# Patient Record
Sex: Male | Born: 1935 | Race: White | Hispanic: Yes | Marital: Married | Smoking: Former smoker
Health system: Southern US, Community
[De-identification: ages and names within clinical notes are randomized; demographics above are authoritative.]

## PROBLEM LIST (undated history)

## (undated) DIAGNOSIS — G309 Alzheimer's disease, unspecified: Secondary | ICD-10-CM

## (undated) DIAGNOSIS — M1990 Unspecified osteoarthritis, unspecified site: Secondary | ICD-10-CM

## (undated) DIAGNOSIS — G2581 Restless legs syndrome: Secondary | ICD-10-CM

## (undated) DIAGNOSIS — M5416 Radiculopathy, lumbar region: Secondary | ICD-10-CM

## (undated) DIAGNOSIS — Z8719 Personal history of other diseases of the digestive system: Secondary | ICD-10-CM

## (undated) DIAGNOSIS — H9190 Unspecified hearing loss, unspecified ear: Secondary | ICD-10-CM

## (undated) DIAGNOSIS — R55 Syncope and collapse: Secondary | ICD-10-CM

## (undated) DIAGNOSIS — N401 Enlarged prostate with lower urinary tract symptoms: Secondary | ICD-10-CM

## (undated) DIAGNOSIS — E785 Hyperlipidemia, unspecified: Secondary | ICD-10-CM

## (undated) DIAGNOSIS — R634 Abnormal weight loss: Secondary | ICD-10-CM

## (undated) DIAGNOSIS — F0280 Dementia in other diseases classified elsewhere without behavioral disturbance: Secondary | ICD-10-CM

## (undated) DIAGNOSIS — I10 Essential (primary) hypertension: Secondary | ICD-10-CM

## (undated) DIAGNOSIS — D649 Anemia, unspecified: Secondary | ICD-10-CM

## (undated) DIAGNOSIS — R7989 Other specified abnormal findings of blood chemistry: Secondary | ICD-10-CM

## (undated) DIAGNOSIS — H11009 Unspecified pterygium of unspecified eye: Secondary | ICD-10-CM

## (undated) DIAGNOSIS — E871 Hypo-osmolality and hyponatremia: Secondary | ICD-10-CM

## (undated) DIAGNOSIS — K219 Gastro-esophageal reflux disease without esophagitis: Secondary | ICD-10-CM

## (undated) DIAGNOSIS — N138 Other obstructive and reflux uropathy: Secondary | ICD-10-CM

## (undated) DIAGNOSIS — H547 Unspecified visual loss: Secondary | ICD-10-CM

## (undated) HISTORY — DX: Hyperlipidemia, unspecified: E78.5

## (undated) HISTORY — DX: Unspecified hearing loss, unspecified ear: H91.90

## (undated) HISTORY — DX: Benign prostatic hyperplasia with lower urinary tract symptoms: N13.8

## (undated) HISTORY — DX: Radiculopathy, lumbar region: M54.16

## (undated) HISTORY — DX: Dementia in other diseases classified elsewhere without behavioral disturbance: F02.80

## (undated) HISTORY — DX: Benign prostatic hyperplasia with lower urinary tract symptoms: N40.1

## (undated) HISTORY — DX: Other specified abnormal findings of blood chemistry: R79.89

## (undated) HISTORY — DX: Anemia, unspecified: D64.9

## (undated) HISTORY — DX: Hypo-osmolality and hyponatremia: E87.1

## (undated) HISTORY — DX: Syncope and collapse: R55

## (undated) HISTORY — PX: HERNIA REPAIR: SHX51

## (undated) HISTORY — DX: Restless legs syndrome: G25.81

## (undated) HISTORY — DX: Abnormal weight loss: R63.4

## (undated) HISTORY — DX: Unspecified pterygium of unspecified eye: H11.009

## (undated) HISTORY — DX: Unspecified visual loss: H54.7

## (undated) HISTORY — DX: Alzheimer's disease, unspecified: G30.9

---
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
## 1978-06-08 HISTORY — PX: INGUINAL HERNIA REPAIR: SHX194

## 2003-10-01 ENCOUNTER — Encounter: Admission: RE | Admit: 2003-10-01 | Discharge: 2003-10-01 | Payer: Self-pay | Admitting: Family Medicine

## 2003-10-04 ENCOUNTER — Encounter: Payer: Self-pay | Admitting: Cardiology

## 2003-10-04 ENCOUNTER — Inpatient Hospital Stay (HOSPITAL_COMMUNITY): Admission: EM | Admit: 2003-10-04 | Discharge: 2003-10-06 | Payer: Self-pay | Admitting: Emergency Medicine

## 2003-10-15 ENCOUNTER — Encounter: Admission: RE | Admit: 2003-10-15 | Discharge: 2003-10-15 | Payer: Self-pay | Admitting: Sports Medicine

## 2003-11-19 ENCOUNTER — Encounter: Admission: RE | Admit: 2003-11-19 | Discharge: 2003-11-19 | Payer: Self-pay | Admitting: Sports Medicine

## 2005-05-11 ENCOUNTER — Ambulatory Visit: Payer: Self-pay | Admitting: Family Medicine

## 2005-05-25 ENCOUNTER — Ambulatory Visit: Payer: Self-pay | Admitting: Family Medicine

## 2005-07-27 ENCOUNTER — Ambulatory Visit: Payer: Self-pay | Admitting: Family Medicine

## 2005-08-10 ENCOUNTER — Ambulatory Visit: Payer: Self-pay | Admitting: Family Medicine

## 2005-09-07 ENCOUNTER — Ambulatory Visit: Payer: Self-pay | Admitting: Family Medicine

## 2005-10-05 ENCOUNTER — Ambulatory Visit: Payer: Self-pay | Admitting: Family Medicine

## 2006-05-10 ENCOUNTER — Ambulatory Visit: Payer: Self-pay | Admitting: Sports Medicine

## 2006-07-12 ENCOUNTER — Ambulatory Visit (HOSPITAL_COMMUNITY): Admission: RE | Admit: 2006-07-12 | Discharge: 2006-07-12 | Payer: Self-pay | Admitting: Family Medicine

## 2006-07-12 ENCOUNTER — Ambulatory Visit: Payer: Self-pay | Admitting: Family Medicine

## 2006-07-12 LAB — CONVERTED CEMR LAB
ALT: 10 units/L (ref 0–53)
Albumin: 4.3 g/dL (ref 3.5–5.2)
CO2: 22 meq/L (ref 19–32)
Chloride: 98 meq/L (ref 96–112)
Sodium: 135 meq/L (ref 135–145)
Total Bilirubin: 0.6 mg/dL (ref 0.3–1.2)

## 2006-07-13 ENCOUNTER — Ambulatory Visit: Payer: Self-pay | Admitting: Family Medicine

## 2006-08-04 ENCOUNTER — Ambulatory Visit: Payer: Self-pay | Admitting: Family Medicine

## 2006-08-05 DIAGNOSIS — G4700 Insomnia, unspecified: Secondary | ICD-10-CM

## 2006-08-05 DIAGNOSIS — I70219 Atherosclerosis of native arteries of extremities with intermittent claudication, unspecified extremity: Secondary | ICD-10-CM | POA: Insufficient documentation

## 2006-08-05 DIAGNOSIS — R209 Unspecified disturbances of skin sensation: Secondary | ICD-10-CM

## 2006-08-05 DIAGNOSIS — G2589 Other specified extrapyramidal and movement disorders: Secondary | ICD-10-CM

## 2006-08-05 DIAGNOSIS — N400 Enlarged prostate without lower urinary tract symptoms: Secondary | ICD-10-CM | POA: Insufficient documentation

## 2006-08-05 DIAGNOSIS — E785 Hyperlipidemia, unspecified: Secondary | ICD-10-CM

## 2006-08-05 DIAGNOSIS — K219 Gastro-esophageal reflux disease without esophagitis: Secondary | ICD-10-CM

## 2006-10-08 ENCOUNTER — Ambulatory Visit: Payer: Self-pay | Admitting: Family Medicine

## 2006-10-08 LAB — CONVERTED CEMR LAB
Cholesterol: 154 mg/dL (ref 0–200)
HDL: 39 mg/dL — ABNORMAL LOW (ref >39)
LDL Cholesterol: 90 mg/dL (ref 0–99)
Triglycerides: 124 mg/dL (ref <150)
VLDL: 25 mg/dL (ref 0–40)

## 2006-10-20 ENCOUNTER — Ambulatory Visit: Payer: Self-pay | Admitting: Family Medicine

## 2006-10-20 DIAGNOSIS — I10 Essential (primary) hypertension: Secondary | ICD-10-CM | POA: Insufficient documentation

## 2006-10-20 DIAGNOSIS — H1045 Other chronic allergic conjunctivitis: Secondary | ICD-10-CM | POA: Insufficient documentation

## 2006-10-20 DIAGNOSIS — J301 Allergic rhinitis due to pollen: Secondary | ICD-10-CM

## 2006-11-02 ENCOUNTER — Ambulatory Visit: Payer: Self-pay | Admitting: Family Medicine

## 2006-11-02 LAB — CONVERTED CEMR LAB
BUN: 19 mg/dL (ref 6–23)
CO2: 24 meq/L (ref 19–32)
Creatinine, Ser: 1.54 mg/dL — ABNORMAL HIGH (ref 0.40–1.50)
Potassium: 4.8 meq/L (ref 3.5–5.3)
Sodium: 135 meq/L (ref 135–145)

## 2006-11-09 ENCOUNTER — Ambulatory Visit: Payer: Self-pay | Admitting: Family Medicine

## 2006-11-09 DIAGNOSIS — N400 Enlarged prostate without lower urinary tract symptoms: Secondary | ICD-10-CM

## 2006-11-09 DIAGNOSIS — H9190 Unspecified hearing loss, unspecified ear: Secondary | ICD-10-CM

## 2006-11-09 DIAGNOSIS — R0789 Other chest pain: Secondary | ICD-10-CM | POA: Insufficient documentation

## 2009-03-08 HISTORY — PX: PACEMAKER PLACEMENT: SHX43

## 2009-03-22 ENCOUNTER — Ambulatory Visit: Payer: Self-pay | Admitting: Internal Medicine

## 2009-03-23 ENCOUNTER — Ambulatory Visit: Payer: Self-pay | Admitting: Vascular Surgery

## 2009-03-23 ENCOUNTER — Inpatient Hospital Stay (HOSPITAL_COMMUNITY): Admission: EM | Admit: 2009-03-23 | Discharge: 2009-03-26 | Payer: Self-pay | Admitting: Emergency Medicine

## 2009-03-23 ENCOUNTER — Encounter (INDEPENDENT_AMBULATORY_CARE_PROVIDER_SITE_OTHER): Payer: Self-pay | Admitting: Internal Medicine

## 2009-03-25 ENCOUNTER — Encounter: Payer: Self-pay | Admitting: Internal Medicine

## 2009-03-26 ENCOUNTER — Encounter: Payer: Self-pay | Admitting: Internal Medicine

## 2009-04-08 ENCOUNTER — Ambulatory Visit: Payer: Self-pay

## 2009-04-10 ENCOUNTER — Encounter: Payer: Self-pay | Admitting: Physician Assistant

## 2009-04-10 ENCOUNTER — Ambulatory Visit: Payer: Self-pay | Admitting: Internal Medicine

## 2009-04-17 ENCOUNTER — Ambulatory Visit: Payer: Self-pay | Admitting: Physician Assistant

## 2009-04-17 DIAGNOSIS — K5900 Constipation, unspecified: Secondary | ICD-10-CM | POA: Insufficient documentation

## 2009-04-17 LAB — CONVERTED CEMR LAB
AST: 16 units/L (ref 0–37)
Albumin: 4.4 g/dL (ref 3.5–5.2)
Alkaline Phosphatase: 81 units/L (ref 39–117)
Calcium: 9.6 mg/dL (ref 8.4–10.5)
Creatinine, Ser: 1.32 mg/dL (ref 0.40–1.50)
Total Protein: 7.4 g/dL (ref 6.0–8.3)

## 2009-04-30 ENCOUNTER — Telehealth: Payer: Self-pay | Admitting: Physician Assistant

## 2009-04-30 LAB — CONVERTED CEMR LAB: BUN: 19 mg/dL (ref 6–23)

## 2009-06-28 ENCOUNTER — Ambulatory Visit: Payer: Self-pay | Admitting: Internal Medicine

## 2009-07-09 ENCOUNTER — Ambulatory Visit: Payer: Self-pay | Admitting: Family Medicine

## 2009-07-09 LAB — CONVERTED CEMR LAB
Chloride: 101 meq/L (ref 96–112)
Creatinine, Ser: 1.23 mg/dL (ref 0.40–1.50)
HCT: 39.7 % (ref 39.0–52.0)
Hemoglobin: 13.7 g/dL (ref 13.0–17.0)
MCV: 91.7 fL (ref 78.0–100.0)
Platelets: 225 10*3/uL (ref 150–400)
RBC: 4.33 M/uL (ref 4.22–5.81)
RDW: 13.1 % (ref 11.5–15.5)
Sodium: 135 meq/L (ref 135–145)

## 2009-07-11 ENCOUNTER — Encounter: Payer: Self-pay | Admitting: Family Medicine

## 2009-10-11 ENCOUNTER — Ambulatory Visit: Payer: Self-pay | Admitting: Family Medicine

## 2009-10-31 ENCOUNTER — Ambulatory Visit: Payer: Self-pay | Admitting: Family Medicine

## 2009-10-31 DIAGNOSIS — F329 Major depressive disorder, single episode, unspecified: Secondary | ICD-10-CM

## 2009-12-17 ENCOUNTER — Ambulatory Visit: Payer: Self-pay | Admitting: Family Medicine

## 2010-02-19 ENCOUNTER — Telehealth: Payer: Self-pay | Admitting: Family Medicine

## 2010-02-25 ENCOUNTER — Encounter: Payer: Self-pay | Admitting: Family Medicine

## 2010-02-25 ENCOUNTER — Ambulatory Visit: Payer: Self-pay | Admitting: Family Medicine

## 2010-02-25 LAB — CONVERTED CEMR LAB
LDL Cholesterol: 137 mg/dL — ABNORMAL HIGH (ref 0–99)
Triglycerides: 104 mg/dL (ref <150)

## 2010-03-06 ENCOUNTER — Ambulatory Visit: Payer: Self-pay | Admitting: Family Medicine

## 2010-03-13 ENCOUNTER — Ambulatory Visit: Payer: Self-pay | Admitting: Internal Medicine

## 2010-03-13 DIAGNOSIS — I441 Atrioventricular block, second degree: Secondary | ICD-10-CM | POA: Insufficient documentation

## 2010-07-08 NOTE — Assessment & Plan Note (Signed)
Summary: f/u,df    Vital Signs:  Patient profile:   77 year old male Height:      63 inches Weight:      137.2 pounds BMI:     24.39 Pulse rate:   72 / minute BP sitting:   122 / 70  (left arm) Cuff size:   regular  Vitals Entered By: Arlyss Repress CMA, (December 17, 2009 2:20 PM) CC: f/up last OV/HTN Is Patient Diabetic? No Pain Assessment Patient in pain? no        Primary Care Provider:  Zachery Dauer MD  CC:  f/up last OV/HTN.  History of Present Illness: Eyes tearing and nose running past couple weeks. history of allergies all year long. Not taking antihistamines.   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   Habits & Providers  Alcohol-Tobacco-Diet     Tobacco Status: quit > 6 months  Current Medications (verified): 1)  Aspirin Ec 325 Mg Tbec (Aspirin) .... Take One Tablet By Mouth Daily 2)  Fish Oil   Oil (Fish Oil) .... Take 1 Capsule By Mouth Three Times A Day 3)  Gabapentin 300 Mg Caps (Gabapentin) .... Take 1 Capsule By Mouth At Bedtime 4)  Lovastatin 10 Mg Tabs (Lovastatin) .... Take One Tablet By Mouth Daily At Bedtime 5)  Omeprazole 20 Mg Cpdr (Omeprazole) .... Take 2 Capsules By Mouth  A Day 6)  Paroxetine Hcl 20 Mg Tabs (Paroxetine Hcl) .Marland Kitchen.. 1 Tab By Mouth Daily 7)  Miralax  Powd (Polyethylene Glycol 3350) .... Use 17gm in 8 Oz of Water/juice Daily For Bowels 8)  Amlodipine Besylate 5 Mg Tabs (Amlodipine Besylate) .Marland Kitchen.. 1 Tab By Mouth Daily For High Blood Pressure.  Label in Spanish 9)  Fluticasone Propionate 50 Mcg/act Susp (Fluticasone Propionate) .... 2 Sprays Each Nostril Daily For Allergies.  Label in Spanish. 10)  Loratadine 10 Mg Tabs (Loratadine) .... Take One Tablet Daily  Allergies (verified): No Known Drug Allergies  Physical Exam  General:  alert, well-developed, and well-nourished.  thin.  NAD. vitals reviewed Ears:  Hearing aide in right ear. Canals and TM's normal  Nose:  nasal dischargemucosal pallor.   Mouth:  Upper  denture. Pharynx normal Lungs:  normal breath sounds, no crackles, and no wheezes.   Heart:  normal rate and regular rhythm.   Abdomen:  soft and non-tender.   Extremities:  no edema Psych:  Pleasant but subdued.     Impression & Recommendations:  Problem # 1:  ALLERGIC RHINITIS, SEASONAL (ICD-477.0) Start Loratidine [facility]- Est  Level 4 ([address])  Problem # 2:  HYPERTENSION, BENIGN (ICD-401.1) Well controlled on current meds/  His updated medication list for this problem includes:    Amlodipine Besylate 5 Mg Tabs (Amlodipine besylate) .Marland Kitchen... 1 tab by mouth daily for high blood pressure.  label in spanish  Orders: [facility]- Est  Level 4 ([address])  Problem # 3:  GASTROESOPHAGEAL REFLUX, NO ESOPHAGITIS (ICD-530.81) no complaints of recent symptoms  His updated medication list for this problem includes:    Omeprazole 20 Mg Cpdr (Omeprazole) .Marland Kitchen... Take 2 capsules by mouth  a day  Complete Medication List: 1)  Aspirin Ec 325 Mg Tbec (Aspirin) .... Take one tablet by mouth daily 2)  Fish Oil Oil (Fish oil) .... Take 1 capsule by mouth three times a day 3)  Gabapentin 300 Mg Caps (Gabapentin) .... Take 1 capsule by mouth at bedtime 4)  Lovastatin 10 Mg Tabs (Lovastatin) .... Take one tablet by  mouth daily at bedtime 5)  Omeprazole 20 Mg Cpdr (Omeprazole) .... Take 2 capsules by mouth  a day 6)  Paroxetine Hcl 20 Mg Tabs (Paroxetine hcl) .Marland Kitchen.. 1 tab by mouth daily 7)  Miralax Powd (Polyethylene glycol 3350) .... Use 17gm in 8 oz of water/juice daily for bowels 8)  Amlodipine Besylate 5 Mg Tabs (Amlodipine besylate) .Marland Kitchen.. 1 tab by mouth daily for high blood pressure.  label in spanish 9)  Fluticasone Propionate 50 Mcg/act Susp (Fluticasone propionate) .... 2 sprays each nostril daily for allergies.  label in spanish. 10)  Loratadine 10 Mg Tabs (Loratadine) .... Take one tablet daily  Other Orders: Future Orders: Lipid-[facility] ([id number]) ... 12/07/2010  Patient Instructions: 1)  Please return for a  FASTING Lipid Profile one(1) week before your next appointment as scheduled.  2)  Please schedule a follow-up appointment in 3 months .  Prescriptions: MIRALAX  POWD (POLYETHYLENE GLYCOL 3350) Use 17gm in 8 oz of water/juice daily for bowels  #1 container x 3   Entered and Authorized by:   Zachery Dauer MD   Signed by:   Zachery Dauer MD on 12/17/2009   Method used:   Electronically to        [facility]* (mail-order)       [address]       [place], [place]  [address]       Ph: [phone]       Fax: [phone]   RxID:   [id number] FLUTICASONE PROPIONATE 50 MCG/ACT SUSP (FLUTICASONE PROPIONATE) 2 sprays each nostril daily for allergies.  label in spanish.  #1 x 3   Entered and Authorized by:   Zachery Dauer MD   Signed by:   Zachery Dauer MD on 12/17/2009   Method used:   Electronically to        [facility]* (mail-order)       [address]       [place], [place]  [address]       Ph: [phone]       Fax: [phone]   RxID:   [id number] AMLODIPINE BESYLATE 5 MG TABS (AMLODIPINE BESYLATE) 1 tab by mouth daily for high blood pressure.  label in spanish  #90 x 3   Entered and Authorized by:   Zachery Dauer MD   Signed by:   Zachery Dauer MD on 12/17/2009   Method used:   Electronically to        [facility]* (mail-order)       [address]       [place], [place]  [address]       Ph: [phone]       Fax: [phone]   RxID:   [id number] PAROXETINE HCL 20 MG TABS (PAROXETINE HCL) 1 tab by mouth daily  #90 x 3   Entered and Authorized by:   Zachery Dauer MD   Signed by:   Zachery Dauer MD on 12/17/2009   Method used:   Electronically to        [facility]* (mail-order)       [address]       [place], [place]  [address]       Ph: [phone]       Fax: [phone]   RxID:   [id number] LOVASTATIN 10 MG TABS (LOVASTATIN) Take one tablet by mouth daily at bedtime  #90 x 3   Entered and Authorized  by:   Zachery Dauer  MD   Signed by:   Zachery Dauer MD on 12/17/2009   Method used:   Electronically to        [facility]* (mail-order)       [address]       [place], [place]  [address]       Ph: [phone]       Fax: [phone]   RxID:   [id number] GABAPENTIN 300 MG CAPS (GABAPENTIN) Take 1 capsule by mouth at bedtime  #90 x 3   Entered and Authorized by:   Zachery Dauer MD   Signed by:   Zachery Dauer MD on 12/17/2009   Method used:   Electronically to        [facility]* (mail-order)       [address]       [place], [place]  [address]       Ph: [phone]       Fax: [phone]   RxID:   [id number] ASPIRIN EC 325 MG TBEC (ASPIRIN) Take one tablet by mouth daily  #90 x 3   Entered and Authorized by:   Zachery Dauer MD   Signed by:   Zachery Dauer MD on 12/17/2009   Method used:   Electronically to        [facility]* (mail-order)       [address]       [place], [place]  [address]       Ph: [phone]       Fax: [phone]   RxID:   [id number] LORATADINE 10 MG TABS (LORATADINE) Take one tablet daily  #90 x 3   Entered and Authorized by:   Zachery Dauer MD   Signed by:   Zachery Dauer MD on 12/17/2009   Method used:   Electronically to        [facility]* (mail-order)       [address]       [place], [place]  [address]       Ph: [phone]       Fax: [phone]   RxID:   [id number]     Prevention & Chronic Care Immunizations   Influenza vaccine: Not documented    Tetanus booster: 09/12/2005: Done.    Pneumococcal vaccine: Done.  (06/09/1999)    H. zoster vaccine: Not documented  Colorectal Screening   Hemoccult: Not documented    Colonoscopy: Not documented   Colonoscopy action/deferral: Not indicated  (12/17/2009)  Other Screening   PSA: Not documented   PSA action/deferral: Not indicated  (07/09/2009)   Smoking status: quit > 6 months  (12/17/2009)  Lipids    Total Cholesterol: 154  (10/08/2006)   LDL: 90  (10/08/2006)   LDL Direct: Not documented   HDL: 39  (10/08/2006)   Triglycerides: 124  (10/08/2006)    SGOT (AST): 16  (04/10/2009)   SGPT (ALT): 12  (04/10/2009)   Alkaline phosphatase: 81  (04/10/2009)   Total bilirubin: 0.6  (04/10/2009)    Lipid flowsheet reviewed?: Yes   Progress toward LDL goal: Unchanged  Hypertension   Last Blood Pressure: 122 / 70  (12/17/2009)   Serum creatinine: 1.23  (07/09/2009)   Serum potassium 4.3  (07/09/2009)    Hypertension flowsheet reviewed?: Yes   Progress toward BP goal: At goal  Self-Management Support :   Personal Goals (by the next clinic visit) :      Personal blood pressure goal: 140/90  (  07/09/2009)     Personal LDL goal: 130  (07/09/2009)    Hypertension self-management support: Not documented    Lipid self-management support: Not documented      Appended Document: f/u,df     Prescriptions: OMEPRAZOLE 20 MG CPDR (OMEPRAZOLE) Take 2 capsules by mouth  a day  #30 x 11   Entered and Authorized by:   Zachery Dauer MD   Signed by:   Zachery Dauer MD on 01/27/2010   Method used:   Electronically to        [facility] [phone]* (retail)       [address]       [place], [place]  [address]       Ph: [phone]       Fax: [phone]   RxID:   [id number] GABAPENTIN 300 MG CAPS (GABAPENTIN) Take 1 capsule by mouth at bedtime  #90 x 11   Entered and Authorized by:   Zachery Dauer MD   Signed by:   Zachery Dauer MD on 01/27/2010   Method used:   Electronically to        [facility] [phone]* (retail)       [address]       [place], [place]  [address]       Ph: [phone]       Fax: [phone]   RxID:   [id number]

## 2010-07-08 NOTE — Progress Notes (Signed)
Summary: Rx Req omeprazole   Phone Note Refill Request Call back at Home Phone [phone] Message from:  [facility] -DAUGHTER  Refills Requested: Medication #1:  OMEPRAZOLE 20 MG CPDR Take 2 capsules by mouth  a day [facility] FAX NUMBER [phone]  Initial call taken by: Clydell Hakim,  February 19, 2010 4:21 PM    Prescriptions: OMEPRAZOLE 20 MG CPDR (OMEPRAZOLE) Take 2 capsules by mouth  a day  #180 x 11   Entered and Authorized by:   Zachery Dauer MD   Signed by:   Zachery Dauer MD on 02/19/2010   Method used:   Electronically to        [facility]* (mail-order)       [address]       [place], [place]  [address]       Ph: [phone]       Fax: [phone]   RxID:   [id number]

## 2010-07-08 NOTE — Assessment & Plan Note (Signed)
Summary: f/up Htn,tcb   Vital Signs:  Patient profile:   77 year old male Height:      63 inches Weight:      134 pounds Temp:     98.0 degrees F oral Pulse rate:   114 / minute BP sitting:   130 / 81  (right arm) Cuff size:   regular  Vitals Entered By: Tessie Fass, CMA (July 09, 2009 2:31 PM)  Serial Vital Signs/Assessments:  Time      Position  BP       Pulse  Resp  Temp     By                              88                    Zachery Dauer MD  CC: F/U Is Patient Diabetic? No Pain Assessment Patient in pain? no        Primary Care Provider:  Tereso Newcomer, PA-C  CC:  F/U.  History of Present Illness: Hasn't had anymore palpitations or lightheadedness since getting his pacemaker.  complains of insomnia. Naps in the daytime. Goes to bed at 9PM  Had pneumovax and flu shot last fall in [place].   Habits & Providers  Alcohol-Tobacco-Diet     Tobacco Status: quit  Current Medications (verified): 1)  Aspirin Ec 325 Mg Tbec (Aspirin) .... Take One Tablet By Mouth Daily 2)  Fish Oil   Oil (Fish Oil) .... Take 1 Capsule By Mouth Three Times A Day 3)  Gabapentin 300 Mg Caps (Gabapentin) .... Take 1 Capsule By Mouth At Bedtime 4)  Lovastatin 10 Mg Tabs (Lovastatin) .... Take One Tablet By Mouth Daily At Bedtime 5)  Omeprazole 20 Mg Cpdr (Omeprazole) .... Take 2 Capsules By Mouth  A Day 6)  Paroxetine Hcl 10 Mg Tabs (Paroxetine Hcl) .... Take 1 Tablet By Mouth Once A Day 7)  Miralax  Powd (Polyethylene Glycol 3350) .... Use 17gm in 8 Oz of Water/juice Daily For Bowels 8)  Enalapril Maleate 10 Mg Tabs (Enalapril Maleate) .... Take One Tablet By Mouth Once Daily  Allergies (verified): No Known Drug Allergies  Social History: From [place], lived in [place] until 2/05.   10 children (multiple grandchildren), 3 children in [place].  Retired Visual merchandiser. Education, second grade, can't read Spanish  Lives w/wife and son.  No tob/etoh.  Catholic Daughter  Jacky Kindle  Physical Exam  General:  alert, well-developed, and well-nourished.   Chest Wall:  L chest pacer site healing well without drainage or redness Lungs:  normal breath sounds, no crackles, and no wheezes.   Heart:  normal rate and regular rhythm.   Extremities:  no edema   Impression & Recommendations:  Problem # 1:  HYPERTENSION, BENIGN (ICD-401.1)  His updated medication list for this problem includes:    Enalapril Maleate 10 Mg Tabs (Enalapril maleate) .Marland Kitchen... Take one tablet by mouth once daily  Problem # 2:  HYPONATREMIA (ICD-276.1)  Orders: [facility]- Est  Level 4 ([address]) Basic Met-[facility] ([address])  Problem # 3:  LOSS, HEARING NOS (ICD-389.9) Wears aide right side  Problem # 4:  INSOMNIA NOS (ICD-780.52) Encouraged not to nap in the daytime and to spend less time in bed  Problem # 5:  GASTROESOPHAGEAL REFLUX, NO ESOPHAGITIS (ICD-530.81)  His updated medication list for this problem includes:    Omeprazole 20 Mg Cpdr (  Omeprazole) .Marland Kitchen... Take 2 capsules by mouth  a day  Orders: [facility] ([address])  Complete Medication List: 1)  Aspirin Ec 325 Mg Tbec (Aspirin) .... Take one tablet by mouth daily 2)  Fish Oil Oil (Fish oil) .... Take 1 capsule by mouth three times a day 3)  Gabapentin 300 Mg Caps (Gabapentin) .... Take 1 capsule by mouth at bedtime 4)  Lovastatin 10 Mg Tabs (Lovastatin) .... Take one tablet by mouth daily at bedtime 5)  Omeprazole 20 Mg Cpdr (Omeprazole) .... Take 2 capsules by mouth  a day 6)  Paroxetine Hcl 10 Mg Tabs (Paroxetine hcl) .... Take 1 tablet by mouth once a day 7)  Miralax Powd (Polyethylene glycol 3350) .... Use 17gm in 8 oz of water/juice daily for bowels 8)  Enalapril Maleate 10 Mg Tabs (Enalapril maleate) .... Take one tablet by mouth once daily  Patient Instructions: 1)  Please schedule a follow-up appointment in 3 months .  2)  voy a llamarle si hay problema con las pruebas de [place].  3)  Mande las pruebas de  excremento cuando los han completado   Prevention & Chronic Care Immunizations   Influenza vaccine: Not documented    Tetanus booster: 09/12/2005: Done.    Pneumococcal vaccine: Done.  (06/09/1999)    H. zoster vaccine: Not documented  Colorectal Screening   Hemoccult: Not documented    Colonoscopy: Not documented  Other Screening   PSA: Not documented   PSA action/deferral: Not indicated  (07/09/2009)   Smoking status: quit  (07/09/2009)  Lipids   Total Cholesterol: 154  (10/08/2006)   LDL: 90  (10/08/2006)   LDL Direct: Not documented   HDL: 39  (10/08/2006)   Triglycerides: 124  (10/08/2006)    SGOT (AST): 16  (04/10/2009)   SGPT (ALT): 12  (04/10/2009)   Alkaline phosphatase: 81  (04/10/2009)   Total bilirubin: 0.6  (04/10/2009)    Lipid flowsheet reviewed?: Yes  Hypertension   Last Blood Pressure: 130 / 81  (07/09/2009)   Serum creatinine: 1.21  (04/17/2009)   Serum potassium 5.3  (04/17/2009)    Hypertension flowsheet reviewed?: Yes   Progress toward BP goal: At goal  Self-Management Support :   Personal Goals (by the next clinic visit) :      Personal blood pressure goal: 140/90  (07/09/2009)     Personal LDL goal: 130  (07/09/2009)    Hypertension self-management support: Not documented    Lipid self-management support: Not documented    Appended Document: Hemoccult card neg x 3  Laboratory Results  Date/Time Received: July 15, 2009   Date/Time Reported: July 15, 2009 3:57 PM   Stool - Occult Blood Hemmoccult #1: negative Hemoccult #2: negative Hemoccult #3: negative Comments: No collection dates written on cards.  ...........test performed by...........Marland KitchenTerese Door, CMA

## 2010-07-08 NOTE — Assessment & Plan Note (Signed)
Summary: per check out/sf      Allergies Added: NKDA  Visit Type:  Follow-up Primary Provider:  Zachery Dauer MD   History of Present Illness: The patient presents today for routine electrophysiology followup. He reports doing very well since having his pacemaker implanted. The patient denies symptoms of palpitations, chest pain, shortness of breath, orthopnea, PND, lower extremity edema, dizziness, presyncope, syncope, or neurologic sequela. The patient is tolerating medications without difficulties and is otherwise without complaint today.   Current Medications (verified): 1)  Aspirin Ec 325 Mg Tbec (Aspirin) .... Take One Tablet By Mouth Daily 2)  Fish Oil   Oil (Fish Oil) .... Take 1 Capsule By Mouth Three Times A Day 3)  Gabapentin 600 Mg Tabs (Gabapentin) .... Take 1/2 Tab in The Am and 1 At Bedtime 4)  Lovastatin 20 Mg Tabs (Lovastatin) .... Take One Tab Every At Bedtime 5)  Omeprazole 20 Mg Cpdr (Omeprazole) .... Take 2 Capsules By Mouth  A Day 6)  Paroxetine Hcl 20 Mg Tabs (Paroxetine Hcl) .Marland Kitchen.. 1 Tab By Mouth Daily 7)  Miralax  Powd (Polyethylene Glycol 3350) .... Use 17gm in 8 Oz of Water/juice Daily For Bowels 8)  Amlodipine Besylate 5 Mg Tabs (Amlodipine Besylate) .Marland Kitchen.. 1 Tab By Mouth Daily For High Blood Pressure.  Label in Spanish 9)  Fluticasone Propionate 50 Mcg/act Susp (Fluticasone Propionate) .... 2 Sprays Each Nostril Daily For Allergies.  Label in Spanish. 10)  Loratadine 10 Mg Tabs (Loratadine) .... Take One Tablet Daily  Allergies (verified): No Known Drug Allergies  Past History:  Past Medical History: Reviewed history from 06/28/2009 and no changes required. UGI bleed? 4/05 H pylori + 10/2003 not treated yet TSH 2.124 nl - 10/15/2003 Echocardiogram EF 55-65% mild MR - 10/18/2003    a.  03/23/2009:  EF 60-65%, mild LVH, grade 1 dias. dysfxn. BPH symptom score low - 07/31/2005 Hypertension Hyperlipidemia GERD h/o chronic back pain (unsure why he is on  gabapentin) Depression (on paroxetine) second degree AV block and syncope  Past Surgical History: Reviewed history from 04/10/2009 and no changes required. ?testicular procedure 1932 - 10/01/2003 Hernia Repair - 06/08/1978 Permanent pacemaker (03/25/2009) - secondary to syncope in setting of bradycardia and second degree block\par    a.  Dr. Hillis Range  Social History: Reviewed history from 07/09/2009 and no changes required. From [place], lived in [place] until 2/05.   10 children (multiple grandchildren), 3 children in [place].  Retired Visual merchandiser. Education, second grade, can't read Spanish  Lives w/wife and son.  No tob/etoh.  Catholic Daughter Jacky Kindle  Vital Signs:  Patient profile:   78 year old male Height:      63 inches Weight:      142 pounds BMI:     25.25 Pulse rate:   64 / minute BP sitting:   140 / 80  (left arm)  Vitals Entered By: Laurance Flatten CMA (March 13, 2010 3:35 PM)  Physical Exam  General:  alert, well-developed, and well-nourished.  thin.  NAD.   Head:  normocephalic and atraumatic Eyes:  conjunctiva clear, no injection Mouth:  Upper denture. Pharynx normal Neck:  Neck supple, no JVD. No masses, thyromegaly or abnormal cervical nodes. Chest Wall:  pacemaker site is well healed Lungs:  Clear bilaterally to auscultation and percussion. Heart:  Non-displaced PMI, chest non-tender; regular rate and rhythm, S1, S2 without murmurs, rubs or gallops. Carotid upstroke normal, no bruit. Normal abdominal aortic size, no bruits. Femorals normal pulses, no bruits.  Pedals normal pulses. No edema, no varicosities. Abdomen:  Bowel sounds positive; abdomen soft and non-tender without masses, organomegaly, or hernias noted. No hepatosplenomegaly. Msk:  Back normal, normal gait. Muscle strength and tone normal. Pulses:  pulses normal in all 4 extremities Extremities:  No clubbing or cyanosis. Neurologic:  Alert and oriented x 3.   PPM  Specifications Following MD:  Hillis Range, MD     PPM Vendor:  [employer]     PPM Model Number:  [id number]     PPM Serial Number:  [id number] PPM DOI:  03/25/2009     PPM Implanting MD:  Hillis Range, MD  Lead 1    Location: RA     DOI: 03/25/2009     Model #: [id number]     Serial #: [id number]     Status: active Lead 2    Location: RV     DOI: 03/25/2009     Model #: [id number]     Serial #: [id number]     Status: active  PPM Follow Up Battery Voltage:  2.79 V     Battery Est. Longevity:  6Y 9 MTHS     Pacer Dependent:  No       PPM Device Measurements Atrium  Amplitude: 2.5 mV, Impedance: 476 ohms, Threshold: 0.6 V at 0.40 msec Right Ventricle  Amplitude: 17.9 mV, Impedance: 982 ohms, Threshold: 0.5 V at 0.40 msec  Episodes MS Episodes:  385     Ventricular High Rate:  0     Atrial Pacing:  73%     Ventricular Pacing:  99%  Parameters Mode:  DDD-CLS     Lower Rate Limit:  60     Upper Rate Limit:  130 Paced AV Delay:  300     Next Cardiology Appt Due:  09/08/2010 Tech Comments:  CHECKED BY INDUSTRY--NORMAL DEVICE FUNCTION.  ROV IN 6 MTHS W/DEVICE CLINIC. Vella Kohler  March 13, 2010 4:08 PM MD Comments:  agree paced AV extended to from to promote intrinsic conduction.  He has intrinsic conduction today with PR of 260.  If worsening symptoms, we will return AV delay to .  Impression & Recommendations:  Problem # 1:  SECOND DEGREE ATRIOVENTRICULAR HEART BLOCK (ICD-426.13) The patient is s/p PPM.  I have extended av delay as above to promote instrensic conduction. no other changes today.  Problem # 2:  HYPERTENSION, BENIGN (ICD-401.1) stable

## 2010-07-08 NOTE — Letter (Signed)
Summary: Results Follow-up Letter  [facility]  [address]   [place], [place] [address]   Phone: [phone]  Fax: [phone]    07/11/2009  [address] [place], [place]  [address]  Dear Mr. GUTIERREZ,   Patient: [employer]  Las pruebas de sang[place] salieron normales.  Tests: (1) CBC NO Diff (Complete Blood Count) (10000)   WBC                       5.1 K/uL                    4.0-10.5   RBC                       4.33 MIL/uL                 4.22-5.81   Hemoglobin                13.7 g/dL                   57.8-46.9   Hematocrit                39.7 %                      39.0-52.0   MCV                       91.7 fL                     78.0-100.0   MCHC                      34.5 g/dL                   62.9-52.8   RDW                       13.1 %                      11.5-15.5   Platelet Count            225 K/uL                    150-400  Tests: (2) Basic Metabolic Panel ([address])   Sodium                    135 mEq/L                   135-145   Potassium                 4.3 mEq/L                   3.5-5.3   Chloride                  101 mEq/L                   96-112   CO2                       23 mEq/L                    19-32   Glucose  90 mg/dL                    81-19   BUN                       16 mg/dL                    1-47   Creatinine                1.23 mg/dL                  0.40-1.50   Calcium                   8.5 mg/dL                   8.2-95.6  Document Creation Date: 07/09/2009 11:58 PM _______________________________________________________________________  Sincerely,  Zachery Dauer MD [facility]            Appended Document: Results Follow-up Letter mailed.

## 2010-07-08 NOTE — Assessment & Plan Note (Signed)
Summary: pc2/kfw [employer]  Medications Added OMEPRAZOLE 20 MG CPDR (OMEPRAZOLE) Take 2 capsules by mouth  a day ENALAPRIL MALEATE 10 MG TABS (ENALAPRIL MALEATE) Take one tablet by mouth once daily      Allergies Added: NKDA  Visit Type:  Follow-up Primary Provider:  Tereso Newcomer, PA-C   History of Present Illness: The patient presents today for routine electrophysiology followup. He reports doing very well since having his pacemaker implanted. The patient denies symptoms of palpitations, chest pain, shortness of breath, orthopnea, PND, lower extremity edema, dizziness, presyncope, syncope, or neurologic sequela. The patient is tolerating medications without difficulties and is otherwise without complaint today.   Current Medications (verified): 1)  Aspirin Ec 325 Mg Tbec (Aspirin) .... Take One Tablet By Mouth Daily 2)  Fish Oil   Oil (Fish Oil) .... Take 1 Capsule By Mouth Three Times A Day 3)  Gabapentin 300 Mg Caps (Gabapentin) .... Take 1 Capsule By Mouth At Bedtime 4)  Lovastatin 10 Mg Tabs (Lovastatin) .... Take One Tablet By Mouth Daily At Bedtime 5)  Omeprazole 20 Mg Cpdr (Omeprazole) .... Take 2 Capsules By Mouth  A Day 6)  Paroxetine Hcl 10 Mg Tabs (Paroxetine Hcl) .... Take 1 Tablet By Mouth Once A Day 7)  Miralax  Powd (Polyethylene Glycol 3350) .... Use 17gm in 8 Oz of Water/juice Daily For Bowels 8)  Enalapril Maleate 10 Mg Tabs (Enalapril Maleate) .... Take One Tablet By Mouth Once Daily  Allergies (verified): No Known Drug Allergies  Past History:  Past Medical History: UGI bleed? 4/05 H pylori + 10/2003 not treated yet TSH 2.124 nl - 10/15/2003 Echocardiogram EF 55-65% mild MR - 10/18/2003    a.  03/23/2009:  EF 60-65%, mild LVH, grade 1 dias. dysfxn. BPH symptom score low - 07/31/2005 Hypertension Hyperlipidemia GERD h/o chronic back pain (unsure why he is on gabapentin) Depression (on paroxetine) second degree AV block and syncope  Past Surgical  History: Reviewed history from 04/10/2009 and no changes required. ?testicular procedure 1932 - 10/01/2003 Hernia Repair - 06/08/1978 Permanent pacemaker (03/25/2009) - secondary to syncope in setting of bradycardia and second degree block\par    a.  Dr. Hillis Range  Social History: Reviewed history from 10/04/2006 and no changes required. From [place], lived in [place] until 2/05.   10 children (multiple grandchildren), 3 children in [place].  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  No tob/etoh.  Catholic Daughter Jacky Kindle  Vital Signs:  Patient profile:   77 year old male Height:      63 inches Weight:      132 pounds Pulse rate:   68 / minute BP sitting:   152 / 90  (left arm)  Vitals Entered By: Laurance Flatten CMA (June 28, 2009 9:31 AM)  Physical Exam  General:  Well developed, well nourished, in no acute distress. Head:  normocephalic and atraumatic Mouth:  Teeth, gums and palate normal. Oral mucosa normal. Neck:  Neck supple, no JVD. No masses, thyromegaly or abnormal cervical nodes. Chest Wall:  site well healed Lungs:  Clear bilaterally to auscultation and percussion. Heart:  Non-displaced PMI, chest non-tender; regular rate and rhythm, S1, S2 without murmurs, rubs or gallops. Carotid upstroke normal, no bruit. Normal abdominal aortic size, no bruits. Femorals normal pulses, no bruits. Pedals normal pulses. No edema, no varicosities. Abdomen:  Bowel sounds positive; abdomen soft and non-tender without masses, organomegaly, or hernias noted. No hepatosplenomegaly. Msk:  Back normal, normal gait. Muscle strength and tone normal. Pulses:  pulses normal in all 4 extremities Extremities:  No clubbing or cyanosis. Neurologic:  Alert and oriented x 3. Psych:  Normal affect.   PPM Specifications Following MD:  Hillis Range, MD     PPM Vendor:  [employer]     PPM Model Number:  [id number]     PPM Serial Number:  [id number] PPM DOI:  03/25/2009     PPM Implanting MD:  Hillis Range, MD  Lead 1    Location: RA     DOI: 03/25/2009     Model #: [id number]     Serial #: [id number]     Status: active Lead 2    Location: RV     DOI: 03/25/2009     Model #: [id number]     Serial #: [id number]     Status: active  PPM Follow Up Remote Check?  No Battery Voltage:  2.79 V     Battery Est. Longevity:  7 years     Pacer Dependent:  No       PPM Device Measurements Atrium  Amplitude: 6.9 mV, Impedance: 549 ohms, Threshold: 0.6 V at 0.4 msec Right Ventricle  Amplitude: 18.5 mV, Impedance: 982 ohms, Threshold: 0.5 V at 0.4 msec  Episodes Atrial Pacing:  72%     Ventricular Pacing:  81%  Parameters Mode:  DDD-CLS     Lower Rate Limit:  60     Upper Rate Limit:  130 Paced AV Delay:  300     Next Cardiology Appt Due:  03/08/2010 Tech Comments:  Checked by [employer].  ROV 10/11 with Dr. Johney Frame. Altha Harm, LPN  June 28, 2009 10:54 AM   Impression & Recommendations:  Problem # 1:  BRADYCARDIA (ICD-427.89) The patient has a h/o symptomatic bradycardia with syncope and second degree av block. His pacemaker is functioning appropriately.  He has had PMT for which I have made adjustments as above.  His updated medication list for this problem includes:    Aspirin Ec 325 Mg Tbec (Aspirin) .Marland Kitchen... Take one tablet by mouth daily    Enalapril Maleate 10 Mg Tabs (Enalapril maleate) .Marland Kitchen... Take one tablet by mouth once daily  Problem # 2:  HYPERTENSION, BENIGN (ICD-401.1) above goal salt restriction advised  His updated medication list for this problem includes:    Aspirin Ec 325 Mg Tbec (Aspirin) .Marland Kitchen... Take one tablet by mouth daily    Enalapril Maleate 10 Mg Tabs (Enalapril maleate) .Marland Kitchen... Take one tablet by mouth once daily  Patient Instructions: 1)  Your physician recommends that you schedule a follow-up appointment in: Oct 2011 with Dr Johney Frame

## 2010-07-08 NOTE — Assessment & Plan Note (Signed)
Summary: f/u,df   Vital Signs:  Patient profile:   77 year old male Height:      63 inches Weight:      135 pounds BMI:     24.00 Temp:     97.8 degrees F oral Pulse rate:   97 / minute BP sitting:   168 / 100  (left arm) Cuff size:   regular  Vitals Entered By: Tessie Fass CMA (Oct 11, 2009 8:43 AM) CC: F/U hypertension Is Patient Diabetic? No Pain Assessment Patient in pain? no        Primary Care Provider:  Tereso Newcomer, PA-C  CC:  F/U hypertension.  History of Present Illness: CC: f/u HTN  1. HTN - did not take BP med this am.  Does not check at home.  no HA, vision changes, chest pain, tightness, urinary changes, LE swelling.    2. HLD - stable on lovastatin and fish oil.  3. diet - does like salt.  doesn't like fruit and vegetables.  Habits & Providers  Alcohol-Tobacco-Diet     Tobacco Status: quit  Current Medications (verified): 1)  Aspirin Ec 325 Mg Tbec (Aspirin) .... Take One Tablet By Mouth Daily 2)  Fish Oil   Oil (Fish Oil) .... Take 1 Capsule By Mouth Three Times A Day 3)  Gabapentin 300 Mg Caps (Gabapentin) .... Take 1 Capsule By Mouth At Bedtime 4)  Lovastatin 10 Mg Tabs (Lovastatin) .... Take One Tablet By Mouth Daily At Bedtime 5)  Omeprazole 20 Mg Cpdr (Omeprazole) .... Take 2 Capsules By Mouth  A Day 6)  Paroxetine Hcl 10 Mg Tabs (Paroxetine Hcl) .... Take 1 Tablet By Mouth Once A Day 7)  Miralax  Powd (Polyethylene Glycol 3350) .... Use 17gm in 8 Oz of Water/juice Daily For Bowels 8)  Enalapril Maleate 10 Mg Tabs (Enalapril Maleate) .... Take One Tablet By Mouth Once Daily  Allergies (verified): No Known Drug Allergies  Past History:  Past medical, surgical, family and social histories (including risk factors) reviewed for relevance to current acute and chronic problems.  Past Medical History: Reviewed history from 06/28/2009 and no changes required. UGI bleed? 4/05 H pylori + 10/2003 not treated yet TSH 2.124 nl -  10/15/2003 Echocardiogram EF 55-65% mild MR - 10/18/2003    a.  03/23/2009:  EF 60-65%, mild LVH, grade 1 dias. dysfxn. BPH symptom score low - 07/31/2005 Hypertension Hyperlipidemia GERD h/o chronic back pain (unsure why he is on gabapentin) Depression (on paroxetine) second degree AV block and syncope  Past Surgical History: Reviewed history from 04/10/2009 and no changes required. ?testicular procedure 1932 - 10/01/2003 Hernia Repair - 06/08/1978 Permanent pacemaker (03/25/2009) - secondary to syncope in setting of bradycardia and second degree block\par    a.  Dr. Hillis Range  Family History: Reviewed history from 08/05/2006 and no changes required. M, F died 68s  Social History: Reviewed history from 07/09/2009 and no changes required. From [place], lived in [place] until 2/05.   10 children (multiple grandchildren), 3 children in [place].  Retired Visual merchandiser. Education, second grade, can't read Spanish  Lives w/wife and son.  No tob/etoh.  Catholic Daughter Jacky Kindle  Physical Exam  General:  alert, well-developed, and well-nourished.   Neck:  supple.  no bruits Lungs:  normal breath sounds, no crackles, and no wheezes.   Heart:  normal rate and regular rhythm.   Extremities:  no edema   Impression & Recommendations:  Problem # 1:  HYPERTENSION, BENIGN (ICD-401.1)  Assessment Deteriorated  bp elevated today but didnt' take med this am.  advised to keep log for next visit in 3 wks. will decide on addition of med at that visit.  gave red card today.  discussed diet changes - less salt, more potassium.  AHA recommendation 1500mg  Na in diet discussed/  His updated medication list for this problem includes:    Enalapril Maleate 10 Mg Tabs (Enalapril maleate) .Marland Kitchen... Take one tablet by mouth once daily  BP today: 168/100 Prior BP: 130/81 (07/09/2009)  Prior 10 Yr Risk Heart Disease: 22 % (04/10/2009)  Labs Reviewed: K+: 4.3 (07/09/2009) Creat: : 1.23 (07/09/2009)    Chol: 154 (10/08/2006)   HDL: 39 (10/08/2006)   LDL: 90 (10/08/2006)   TG: 124 (10/08/2006)  Orders: [facility]- Est  Level 4 ([address])  Problem # 2:  HYPERLIPIDEMIA (ICD-272.4)  His updated medication list for this problem includes:    Lovastatin 10 Mg Tabs (Lovastatin) .Marland Kitchen... Take one tablet by mouth daily at bedtime  Labs Reviewed: SGOT: 16 (04/10/2009)   SGPT: 12 (04/10/2009)  Prior 10 Yr Risk Heart Disease: 22 % (04/10/2009)   HDL:39 (10/08/2006)  LDL:90 (10/08/2006)  Chol:154 (10/08/2006)  Trig:124 (10/08/2006)  Orders: [facility]- Est  Level 4 ([address])  Complete Medication List: 1)  Aspirin Ec 325 Mg Tbec (Aspirin) .... Take one tablet by mouth daily 2)  Fish Oil Oil (Fish oil) .... Take 1 capsule by mouth three times a day 3)  Gabapentin 300 Mg Caps (Gabapentin) .... Take 1 capsule by mouth at bedtime 4)  Lovastatin 10 Mg Tabs (Lovastatin) .... Take one tablet by mouth daily at bedtime 5)  Omeprazole 20 Mg Cpdr (Omeprazole) .... Take 2 capsules by mouth  a day 6)  Paroxetine Hcl 10 Mg Tabs (Paroxetine hcl) .... Take 1 tablet by mouth once a day 7)  Miralax Powd (Polyethylene glycol 3350) .... Use 17gm in 8 oz of water/juice daily for bowels 8)  Enalapril Maleate 10 Mg Tabs (Enalapril maleate) .... Take one tablet by mouth once daily  Patient Instructions: 1)  Return in 3 wks for bp f/u. 2)  Seguir medicinas.  Tomar Enalapril diario. 3)  Mantener record de presion un par de veces a la semana en tarjeta roja. 4)  Regresar en 3 semanas con el log y ahi veremos si necesitamos subir la dosis o Engineer, drilling. 5)  Gusto verlos hoy! Prescriptions: ENALAPRIL MALEATE 10 MG TABS (ENALAPRIL MALEATE) Take one tablet by mouth once daily  #30 x 3   Entered and Authorized by:   Eustaquio Boyden  MD   Signed by:   Eustaquio Boyden  MD on 10/11/2009   Method used:   Electronically to        [facility] [phone]* (retail)       [address]       [place], [place]  [address]       Ph:  [phone]       Fax: [phone]   RxID:   [id number] MIRALAX  POWD (POLYETHYLENE GLYCOL 3350) Use 17gm in 8 oz of water/juice daily for bowels  #238gm x 1   Entered and Authorized by:   Eustaquio Boyden  MD   Signed by:   Eustaquio Boyden  MD on 10/11/2009   Method used:   Electronically to        [facility] [phone]* (retail)       [address]       [place], [place]  [address]  Ph: [phone]       Fax: [phone]   RxID:   [id number] PAROXETINE HCL 10 MG TABS (PAROXETINE HCL) Take 1 tablet by mouth once a day  #30 x 3   Entered and Authorized by:   Eustaquio Boyden  MD   Signed by:   Eustaquio Boyden  MD on 10/11/2009   Method used:   Electronically to        [facility] [phone]* (retail)       [address]       [place], [place]  [address]       Ph: [phone]       Fax: [phone]   RxID:   [id number] OMEPRAZOLE 20 MG CPDR (OMEPRAZOLE) Take 2 capsules by mouth  a day  #30 x 3   Entered and Authorized by:   Eustaquio Boyden  MD   Signed by:   Eustaquio Boyden  MD on 10/11/2009   Method used:   Electronically to        [facility] [phone]* (retail)       [address]       [place], [place]  [address]       Ph: [phone]       Fax: [phone]   RxID:   [id number] LOVASTATIN 10 MG TABS (LOVASTATIN) Take one tablet by mouth daily at bedtime  #30 x 3   Entered and Authorized by:   Eustaquio Boyden  MD   Signed by:   Eustaquio Boyden  MD on 10/11/2009   Method used:   Electronically to        [facility] [phone]* (retail)       [address]       [place], [place]  [address]       Ph: [phone]       Fax: [phone]   RxID:   [id number] GABAPENTIN 300 MG CAPS (GABAPENTIN) Take 1 capsule by mouth at bedtime  #30 x 3   Entered and Authorized by:   Eustaquio Boyden  MD   Signed by:   Eustaquio Boyden  MD on 10/11/2009   Method used:   Electronically to        [facility] [phone]* (retail)        [address]       [place], [place]  [address]       Ph: [phone]       Fax: [phone]   RxID:   [id number] FISH OIL   OIL (FISH OIL) Take 1 capsule by mouth three times a day  #90 x 3   Entered and Authorized by:   Eustaquio Boyden  MD   Signed by:   Eustaquio Boyden  MD on 10/11/2009   Method used:   Electronically to        [facility] [phone]* (retail)       [address]       [place], [place]  [address]       Ph: [phone]       Fax: [phone]   RxID:   [id number] ASPIRIN EC 325 MG TBEC (ASPIRIN) Take one tablet by mouth daily  #30 x 3   Entered and Authorized by:   Eustaquio Boyden  MD   Signed by:   Eustaquio Boyden  MD on 10/11/2009   Method used:   Electronically to        [facility] [phone]* (retail)  [address]       [place], [place]  [address]       Ph: [phone]       Fax: [phone]   RxID:   [id number]

## 2010-07-08 NOTE — Assessment & Plan Note (Signed)
Summary: f/up,tcb   Vital Signs:  Patient profile:   77 year old male Height:      63 inches Weight:      139 pounds BMI:     24.71 Pulse rate:   79 / minute BP sitting:   137 / 85  (right arm) Cuff size:   regular  Vitals Entered By: Arlyss Repress CMA, (March 06, 2010 8:55 AM) CC: back pain radiates into right leg. f/up labs and refill meds. flu shot today Is Patient Diabetic? No Pain Assessment Patient in pain? yes     Location: back Intensity: 3 Onset of pain  chronic/radiates into right leg   Primary Care Provider:  Zachery Dauer MD  CC:  back pain radiates into right leg. f/up labs and refill meds. flu shot today.  History of Present Illness: occasional headache   Allergic rhinitis symptoms   Not very active per daughter. Discomfort in his legs day and night that feels like ants crawling.   He and wife will be flying to [place] Oct 15 for a couple months.   Interview conducted in Spanish  Habits & Providers  Alcohol-Tobacco-Diet     Tobacco Status: quit > 6 months  Current Medications (verified): 1)  Aspirin Ec 325 Mg Tbec (Aspirin) .... Take One Tablet By Mouth Daily 2)  Fish Oil   Oil (Fish Oil) .... Take 1 Capsule By Mouth Three Times A Day 3)  Gabapentin 600 Mg Tabs (Gabapentin) .... Take 1/2 Tab in The Am and 1 At Bedtime 4)  Lovastatin 20 Mg Tabs (Lovastatin) .... Take One Tab Every At Bedtime 5)  Omeprazole 20 Mg Cpdr (Omeprazole) .... Take 2 Capsules By Mouth  A Day 6)  Paroxetine Hcl 20 Mg Tabs (Paroxetine Hcl) .Marland Kitchen.. 1 Tab By Mouth Daily 7)  Miralax  Powd (Polyethylene Glycol 3350) .... Use 17gm in 8 Oz of Water/juice Daily For Bowels 8)  Amlodipine Besylate 5 Mg Tabs (Amlodipine Besylate) .Marland Kitchen.. 1 Tab By Mouth Daily For High Blood Pressure.  Label in Spanish 9)  Fluticasone Propionate 50 Mcg/act Susp (Fluticasone Propionate) .... 2 Sprays Each Nostril Daily For Allergies.  Label in Spanish. 10)  Loratadine 10 Mg Tabs (Loratadine) .... Take One Tablet  Daily  Allergies (verified): No Known Drug Allergies  Physical Exam  General:  alert, well-developed, and well-nourished.  thin.  NAD. vitals reviewed Ears:  Hearing aide in right ear.   Nose:  nasal discharge mucosal pallor.   Lungs:  normal breath sounds, no crackles, and no wheezes.   Heart:  normal rate and regular rhythm.   Extremities:  no edema Psych:  Pleasant but subdued.  not anxious appearing and not depressed appearing.     Impression & Recommendations:  Problem # 1:  HYPERTENSION, BENIGN (ICD-401.1) Assessment Improved  His updated medication list for this problem includes:    Amlodipine Besylate 5 Mg Tabs (Amlodipine besylate) .Marland Kitchen... 1 tab by mouth daily for high blood pressure.  label in spanish  Orders: [facility]- Est  Level 4 ([address])  Problem # 2:  ALLERGIC RHINITIS, SEASONAL (ICD-477.0) Assessment: Unchanged  Problem # 3:  DEPRESSION (ICD-311) Assessment: Improved  His updated medication list for this problem includes:    Paroxetine Hcl 20 Mg Tabs (Paroxetine hcl) .Marland Kitchen... 1 tab by mouth daily  Orders: [facility]- Est  Level 4 ([address])  Problem # 4:  HYPERLIPIDEMIA (ICD-272.4) Will increase Lovastatin dose His updated medication list for this problem includes:    Lovastatin 20 Mg Tabs (Lovastatin) .Marland KitchenMarland KitchenMarland KitchenMarland Kitchen  Take one tab every at bedtime  Problem # 5:  GASTROESOPHAGEAL REFLUX, NO ESOPHAGITIS (ICD-530.81) No recent symptoms  His updated medication list for this problem includes:    Omeprazole 20 Mg Cpdr (Omeprazole) .Marland Kitchen... Take 2 capsules by mouth  a day  Problem # 6:  RESTLESS LEGS SYNDROME (ICD-333.99) Assessment: Deteriorated Discomfort in his legs day and night  Complete Medication List: 1)  Aspirin Ec 325 Mg Tbec (Aspirin) .... Take one tablet by mouth daily 2)  Fish Oil Oil (Fish oil) .... Take 1 capsule by mouth three times a day 3)  Gabapentin 600 Mg Tabs (Gabapentin) .... Take 1/2 tab in the am and 1 at bedtime 4)  Lovastatin 20 Mg Tabs (Lovastatin) ....  Take one tab every at bedtime 5)  Omeprazole 20 Mg Cpdr (Omeprazole) .... Take 2 capsules by mouth  a day 6)  Paroxetine Hcl 20 Mg Tabs (Paroxetine hcl) .Marland Kitchen.. 1 tab by mouth daily 7)  Miralax Powd (Polyethylene glycol 3350) .... Use 17gm in 8 oz of water/juice daily for bowels 8)  Amlodipine Besylate 5 Mg Tabs (Amlodipine besylate) .Marland Kitchen.. 1 tab by mouth daily for high blood pressure.  label in spanish 9)  Fluticasone Propionate 50 Mcg/act Susp (Fluticasone propionate) .... 2 sprays each nostril daily for allergies.  label in spanish. 10)  Loratadine 10 Mg Tabs (Loratadine) .... Take one tablet daily  Other Orders: Influenza Vaccine MCR ([address])  Patient Instructions: 1)  Please schedule a follow-up appointment in 3 months .  2)  Aumente la dosis de gabapentin a 1 en la manana y 1 en la noche. Depues aumente hasta 2 en la noche. Cuando recibe la receta de 600 mg T[place] 1/2 en la manana y 1 en la noche.  Prescriptions: GABAPENTIN 600 MG TABS (GABAPENTIN) Take 1/2 tab in the AM and 1 at bedtime  #140 x 3   Entered and Authorized by:   Zachery Dauer MD   Signed by:   Zachery Dauer MD on 03/06/2010   Method used:   Electronically to        [facility]* (mail-order)       [address]       [place], [place]  [address]       Ph: [phone]       Fax: [phone]   RxID:   [id number] LOVASTATIN 20 MG TABS (LOVASTATIN) Take one tab every at bedtime  #90 x 3   Entered and Authorized by:   Zachery Dauer MD   Signed by:   Zachery Dauer MD on 03/06/2010   Method used:   Electronically to        [facility]* (mail-order)       [address]       [place], [place]  [address]       Ph: [phone]       Fax: [phone]   RxID:   [id number]    Influenza Vaccine    Vaccine Type: Fluvax MCR    Site: right deltoid    Mfr: GlaxoSmithKline    Dose: 0.5 ml    Route: IM    Given by: Arlyss Repress CMA,    Exp. Date: 12/03/2010    Lot #: [id number]    VIS given:  12/31/09 version given March 06, 2010.  Flu Vaccine Consent Questions    Do you have a history of severe allergic reactions to this vaccine? no    Any prior history of allergic reactions  to egg and/or gelatin? no    Do you have a sensitivity to the preservative Thimersol? no    Do you have a past history of Guillan-Barre Syndrome? no    Do you currently have an acute febrile illness? no    Have you ever had a severe reaction to latex? no    Vaccine information given and explained to patient? yes    Prevention & Chronic Care Immunizations   Influenza vaccine: Fluvax MCR  (03/06/2010)    Tetanus booster: 09/12/2005: Done.    Pneumococcal vaccine: Done.  (06/09/1999)    H. zoster vaccine: Not documented  Colorectal Screening   Hemoccult: Not documented   Hemoccult action/deferral: Not indicated  (03/06/2010)    Colonoscopy: Not documented   Colonoscopy action/deferral: Not indicated  (12/17/2009)  Other Screening   PSA: Not documented   PSA action/deferral: Not indicated  (07/09/2009)   Smoking status: quit > 6 months  (03/06/2010)  Lipids   Total Cholesterol: 200  (02/25/2010)   LDL: 137  (02/25/2010)   LDL Direct: Not documented   HDL: 42  (02/25/2010)   Triglycerides: 104  (02/25/2010)    SGOT (AST): 16  (04/10/2009)   SGPT (ALT): 12  (04/10/2009)   Alkaline phosphatase: 81  (04/10/2009)   Total bilirubin: 0.6  (04/10/2009)    Lipid flowsheet reviewed?: Yes   Progress toward LDL goal: Deteriorated  Hypertension   Last Blood Pressure: 137 / 85  (03/06/2010)   Serum creatinine: 1.23  (07/09/2009)   Serum potassium 4.3  (07/09/2009)    Hypertension flowsheet reviewed?: Yes   Progress toward BP goal: At goal  Self-Management Support :   Personal Goals (by the next clinic visit) :      Personal blood pressure goal: 140/90  (07/09/2009)     Personal LDL goal: 130  (07/09/2009)    Hypertension self-management support: Not documented    Lipid  self-management support: Not documented

## 2010-07-08 NOTE — Assessment & Plan Note (Signed)
Summary: f/up,tcb   Vital Signs:  Patient profile:   77 year old male Weight:      136.1 pounds BMI:     24.20 Temp:     98.1 degrees F Pulse rate:   80 / minute BP sitting:   137 / 77  (right arm)  Vitals Entered By: Ardeen Garland  MD (Oct 31, 2009 10:19 AM) CC: f/u Is Patient Diabetic? No Pain Assessment Patient in pain? no        Primary Care Provider:  Zachery Dauer MD  CC:  f/u.  History of Present Illness: Mr. Allen [name] comes in today with his daughter for follow-up of BP.  She also thinks he seems depressed.  He complains of neck pain and allergies. 1) BP - seen 10 days ago.  Had not taken his medication that morning and his BP was high.  Was advised to restart and return for recheck.  He is on enalapril 10.  Thinks maybe it makes him feel weak and tired.  Reviewed past labs and his K runs high and he has some CRI.   2) Depression - he is on paroxetine 10.  Has been "for years" according to his daughter but I see first and only Rx from [place] for it was 10 days ago.  He denies sadness, depression, irritability, difficulty sleeping, or poor appetite.  Daughter feels like he isn't his normal happy self.  Not as interested in socializing with family or friends.  Isolates himself at get togethers.  Daughter wonders if there is anywhere in the area, like a community center, where he and his wife could go to socialize during the day.  When they lived in [place] there were spanish-speaking community centers.  3) Neck Pain - for 3-4 days.  Woke up with tightness, pain in left side of neck and into shoulder blade.  Feels better when he lets hot water run on it in the shower.  Tylenol helping as well. 4) Allergies - always has a runny nose and sneezes a lot.   Habits & Providers  Alcohol-Tobacco-Diet     Tobacco Status: quit > 6 months  Allergies: No Known Drug Allergies  Social History: Smoking Status:  quit > 6 months  Physical Exam  General:  alert, well-developed, and  well-nourished.  thin.  NAD. vitals reviewed Eyes:  conjunctiva clear, no injection Nose:  normal external exam and mucosa Neck:  FROM, tightened trap/SCM on left Lungs:  normal breath sounds, no crackles, and no wheezes.   Heart:  normal rate and regular rhythm.   Psych:  Oriented X3, memory intact for recent and remote, normally interactive, good eye contact, not anxious appearing, and not depressed appearing.     Impression & Recommendations:  Problem # 1:  HYPERTENSION, BENIGN (ICD-401.1) Assessment Improved Controlled today but given tendency for hyperkalemia would like to stop ACE and start norvasc. Return in 3-4 weeks for recheck.  His updated medication list for this problem includes:    Amlodipine Besylate 5 Mg Tabs (Amlodipine besylate) .Marland Kitchen... 1 tab by mouth daily for high blood pressure.  label in spanish  Orders: [facility] [phone]) [facility]- Est  Level 4 (59563)  Problem # 2:  DEPRESSION (ICD-311) Assessment: Deteriorated  Will increase paroxetine dose to 20.  F/U in 3-4 weeks.  His updated medication list for this problem includes:    Paroxetine Hcl 20 Mg Tabs (Paroxetine hcl) .Marland Kitchen... 1 tab by mouth daily  Orders: [facility]- Est  Level 4 (87564)  Problem # 3:  NECK PAIN (ICD-723.1) Assessment: New  No red flags other than age.  Likely spasm.  Avoiding flexeril given age.  ADvised heat, stretching, tylenol as needed.  His updated medication list for this problem includes:    Aspirin Ec 325 Mg Tbec (Aspirin) .Marland Kitchen... Take one tablet by mouth daily  Orders: [facility]- Est  Level 4 (99214)  Problem # 4:  ALLERGIC RHINITIS, SEASONAL (ICD-477.0) Assessment: Deteriorated  Start flonase.   Orders: [facility]- Est  Level 4 (99214)  Complete Medication List: 1)  Aspirin Ec 325 Mg Tbec (Aspirin) .... Take one tablet by mouth daily 2)  Fish Oil Oil (Fish oil) .... Take 1 capsule by mouth three times a day 3)  Gabapentin 300 Mg Caps (Gabapentin) .... Take 1 capsule by mouth at  bedtime 4)  Lovastatin 10 Mg Tabs (Lovastatin) .... Take one tablet by mouth daily at bedtime 5)  Omeprazole 20 Mg Cpdr (Omeprazole) .... Take 2 capsules by mouth  a day 6)  Paroxetine Hcl 20 Mg Tabs (Paroxetine hcl) .Marland Kitchen.. 1 tab by mouth daily 7)  Miralax Powd (Polyethylene glycol 3350) .... Use 17gm in 8 oz of water/juice daily for bowels 8)  Amlodipine Besylate 5 Mg Tabs (Amlodipine besylate) .Marland Kitchen.. 1 tab by mouth daily for high blood pressure.  label in spanish 9)  Fluticasone Propionate 50 Mcg/act Susp (Fluticasone propionate) .... 2 sprays each nostril daily for allergies.  label in spanish.  Patient Instructions: 1)  No tomes el LISINOPRIL.  2)  Empieza tomar el AMLODIPINE.  Una tableta cada dia para la presion alta. 3)  Visit www.acecare.org to find out about activities for the elderly.  I don't know if they ahve them in Spanish.  You can also call them at [phone]. Prescriptions: FLUTICASONE PROPIONATE 50 MCG/ACT SUSP (FLUTICASONE PROPIONATE) 2 sprays each nostril daily for allergies.  label in spanish.  #1 x 3   Entered by:   Ardeen Garland  MD   Authorized by:   Zachery Dauer MD   Signed by:   Ardeen Garland  MD on 10/31/2009   Method used:   Print then Give to Patient   RxID:   [id number] PAROXETINE HCL 20 MG TABS (PAROXETINE HCL) 1 tab by mouth daily  #33 x 3   Entered by:   Ardeen Garland  MD   Authorized by:   Zachery Dauer MD   Signed by:   Ardeen Garland  MD on 10/31/2009   Method used:   Print then Give to Patient   RxID:   [id number] AMLODIPINE BESYLATE 5 MG TABS (AMLODIPINE BESYLATE) 1 tab by mouth daily for high blood pressure.  label in spanish  #33 x 3   Entered by:   Ardeen Garland  MD   Authorized by:   Zachery Dauer MD   Signed by:   Ardeen Garland  MD on 10/31/2009   Method used:   Print then Give to Patient   RxID:   [id number]

## 2010-08-18 ENCOUNTER — Encounter: Payer: Self-pay | Admitting: Home Health Services

## 2010-09-08 ENCOUNTER — Encounter: Payer: Self-pay | Admitting: *Deleted

## 2010-09-11 LAB — DIFFERENTIAL
Basophils Absolute: 0 10*3/uL (ref 0.0–0.1)
Basophils Relative: 0 % (ref 0–1)
Eosinophils Absolute: 0.1 10*3/uL (ref 0.0–0.7)
Eosinophils Relative: 2 % (ref 0–5)
Lymphocytes Relative: 9 % — ABNORMAL LOW (ref 12–46)
Lymphs Abs: 0.6 10*3/uL — ABNORMAL LOW (ref 0.7–4.0)
Monocytes Absolute: 0.5 10*3/uL (ref 0.1–1.0)
Monocytes Relative: 6 % (ref 3–12)
Neutro Abs: 5.9 10*3/uL (ref 1.7–7.7)
Neutrophils Relative %: 83 % — ABNORMAL HIGH (ref 43–77)

## 2010-09-11 LAB — BASIC METABOLIC PANEL
BUN: 9 mg/dL (ref 6–23)
CO2: 24 mEq/L (ref 19–32)
Calcium: 7.9 mg/dL — ABNORMAL LOW (ref 8.4–10.5)
Calcium: 8.3 mg/dL — ABNORMAL LOW (ref 8.4–10.5)
Chloride: 101 mEq/L (ref 96–112)
Chloride: 94 mEq/L — ABNORMAL LOW (ref 96–112)
Creatinine, Ser: 1.24 mg/dL (ref 0.4–1.5)
Creatinine, Ser: 1.27 mg/dL (ref 0.4–1.5)
Creatinine, Ser: 1.28 mg/dL (ref 0.4–1.5)
Creatinine, Ser: 1.37 mg/dL (ref 0.4–1.5)
GFR calc Af Amer: >60 mL/min (ref >60)
GFR calc Af Amer: 59 mL/min — ABNORMAL LOW (ref >60)
GFR calc non Af Amer: 53 mL/min — ABNORMAL LOW (ref >60)
GFR calc non Af Amer: 54 mL/min — ABNORMAL LOW (ref >60)
GFR calc non Af Amer: 55 mL/min — ABNORMAL LOW (ref >60)
GFR calc non Af Amer: >60 mL/min (ref >60)
GFR calc non Af Amer: 49 mL/min — ABNORMAL LOW (ref >60)
Glucose, Bld: 108 mg/dL — ABNORMAL HIGH (ref 70–99)
Glucose, Bld: 91 mg/dL (ref 70–99)
Potassium: 3.8 mEq/L (ref 3.5–5.1)
Potassium: 4.1 mEq/L (ref 3.5–5.1)
Potassium: 4.2 mEq/L (ref 3.5–5.1)
Potassium: 5.1 mEq/L (ref 3.5–5.1)
Sodium: 127 mEq/L — ABNORMAL LOW (ref 135–145)

## 2010-09-11 LAB — POCT CARDIAC MARKERS
CKMB, poc: 5.3 ng/mL (ref 1.0–8.0)
Myoglobin, poc: 315 ng/mL (ref 12–200)
Troponin i, poc: <0.05 ng/mL (ref 0.00–0.09)

## 2010-09-11 LAB — CBC
HCT: 33 % — ABNORMAL LOW (ref 39.0–52.0)
Platelets: 193 10*3/uL (ref 150–400)
RBC: 3.34 MIL/uL — ABNORMAL LOW (ref 4.22–5.81)
RBC: 3.86 MIL/uL — ABNORMAL LOW (ref 4.22–5.81)
RDW: 12.7 % (ref 11.5–15.5)
WBC: 7.1 10*3/uL (ref 4.0–10.5)

## 2010-09-11 LAB — TYPE AND SCREEN: ABO/RH(D): O POS

## 2010-09-11 LAB — CARDIAC PANEL(CRET KIN+CKTOT+MB+TROPI)
CK, MB: 3.6 ng/mL (ref 0.3–4.0)
Relative Index: 1.9 (ref 0.0–2.5)
Total CK: 190 U/L (ref 7–232)
Total CK: 218 U/L (ref 7–232)

## 2010-09-11 LAB — URINALYSIS, ROUTINE W REFLEX MICROSCOPIC
Bilirubin Urine: NEGATIVE
Ketones, ur: NEGATIVE mg/dL
Nitrite: NEGATIVE
Protein, ur: NEGATIVE mg/dL
pH: 6.5 (ref 5.0–8.0)

## 2010-09-11 LAB — OSMOLALITY: Osmolality: 262 mOsm/kg — ABNORMAL LOW (ref 275–300)

## 2010-09-11 LAB — CK TOTAL AND CKMB (NOT AT ARMC)
CK, MB: 9.8 ng/mL — ABNORMAL HIGH (ref 0.3–4.0)
Relative Index: 2.7 — ABNORMAL HIGH (ref 0.0–2.5)
Total CK: 361 U/L — ABNORMAL HIGH (ref 7–232)

## 2010-09-11 LAB — LIPID PANEL
Cholesterol: 167 mg/dL (ref 0–200)
HDL: 50 mg/dL (ref >39)
Total CHOL/HDL Ratio: 3.3 RATIO
VLDL: 15 mg/dL (ref 0–40)

## 2010-09-11 LAB — TROPONIN I: Troponin I: 0.02 ng/mL (ref 0.00–0.06)

## 2010-09-11 LAB — FRACTIONAL EXCRET-NA(24HR UR +SERUM)
Creatinine, Urine: 86.3 mg/dL
Sodium, Ur: 38 mEq/L
Sodium: 134 mEq/L — ABNORMAL LOW (ref 135–145)

## 2010-09-11 LAB — PROTIME-INR: Prothrombin Time: 14.2 seconds (ref 11.6–15.2)

## 2010-09-11 LAB — ABO/RH: ABO/RH(D): O POS

## 2010-09-11 LAB — MAGNESIUM: Magnesium: 2 mg/dL (ref 1.5–2.5)

## 2010-10-24 NOTE — Discharge Summary (Signed)
Allen [name], Allen [name]                       ACCOUNT NO.:  [id number]   MEDICAL RECORD NO.:  [id number]                   PATIENT TYPE:  INP   LOCATION:  4728                                 FACILITY:  [facility]   PHYSICIAN:  Wayne A. Sheffield Slider, M.D.                 DATE OF BIRTH:  Nov 16, 1918   DATE OF ADMISSION:  10/03/2003  DATE OF DISCHARGE:  10/06/2003                           DISCHARGE SUMMARY - REFERRING   DISCHARGE DIAGNOSES:  1. Syncope.  2. Questionable upper gastrointestinal bleed.  3. Hypotension.  4. Benign prostatic hypertrophy.  5. Renal insufficiency.  6. Gastroesophageal reflux disease.  7. Paresthesias.  8. Peripheral vascular disease.   PROCEDURES:  Include abdominal film that was negative, a chest x-ray showing  right middle lobe lingular and bilateral lower lobe atelectasis, and a 2-D  echocardiogram showing overall left ventricular systolic function normal;  left ventricular ejection fraction estimated between 55% and 65%. No left  ventricular regional wall abnormalities. Trivial aortic valvular  regurgitation. Mild mitral valvular regurgitation. No echocardiographic  evidence for valvular vegetation or a cardiac course of embolism.   DISCHARGE MEDICATIONS:  1. Ambien 10 mg 1 p.o. q.h.s. p.r.n.  2. Neurontin 300 mg 1 p.o. q.h.s.  3. Trazodone 1 mg 1 p.o. q.d.  4. Prevacid 30 mg 1 p.o. b.i.d.   DISPOSITION:  Discharged to home.   FOLLOW UP:  With [facility] in  one week. The patient's family is instructed to call family practice for an  appointment and I have also called and left a message for Norm Salt  regarding the need for this patient to be seen with a week.   HISTORY OF PRESENT ILLNESS:  This is an 77 year old Hispanic male who  presents after an episode of vomiting and a syncopal event. He was watching  television on the night prior to presentation when he had one episode of  vomiting food products and  questionable coffee ground-like substance. The  daughter reports a several minute episode of loss of consciousness  immediately following the vomiting. 911 was called and the patient  was  brought to the emergency department. He had one episode of non-bloody, non-  mucous diarrhea two days prior to presentation. He has a history of  significant reflux symptoms associated with frequent belching. He had  weakness the day prior to admission and he slept throughout most of the day  prior to admission. He denies fevers, chills, sweats. Did have weakness for  one or two days. Has an appetite but was decreased in p.o. intake. Denies  chest pain, palpitation, or swelling. Denies shortness of breath. He does  have a mild cough with occasional sputum production. He does have heartburn  but no abdominal pain. Skin is without rash of his skin and he has itching  __________. Denies focal weakness and dysphagia. He does complain of right  hip and knee pain. Denies  blurred vision. Does complain of dizziness the day  prior to admission. Denies dysuria or hematuria, blood stools or melena.   PAST MEDICAL HISTORY:  Significant for hypertension, benign prostatic  hypertrophy, paresthesias, insomnia, peripheral vascular disease and  gastroesophageal reflux disease. He had a syncopal event in December of 2004  and was put on Plavix at that time. Per the family's report, he had a  carotid ultrasound showing narrowing of the right carotid artery.   ADMISSION MEDICATIONS:  1. Ambien 10 mg q.h.s.  2. Enalapril 5 mg q.d.  3. Meclizine 25 mg b.i.d. p.r.n.  4. Neurontin 600 mg b.i.d.  5. Plavix 75 mg q.d.  6. Rantidine 150 mg b.i.d.  7. Trazodone 5 mg q.h.s., increased from 1 mg in April of 2005.   ALLERGIES:  No known drug allergies.   PAST SURGICAL HISTORY:  He has a history of hernia repair in 1980 and a  testicular procedure in 1932.   SOCIAL HISTORY:  Moved from [place]. Lived in [place] until  February of  this year. He has 10 children, multiple grandchildren. He has a history of  heavy alcohol and tobacco use but none currently.   PHYSICAL EXAMINATION:  VITAL SIGNS:  Temperature ranged from 99 to 101.9.  Pulse 57 to 81. Blood pressure 82/48 to 111/52 in the emergency room.  GENERAL:  A well developed, well nourished  Hispanic male in no acute  distress. Alert and oriented x3 and appropriately responsive.  HEENT:  Sclera nonicteric. Pupils are equal, round, and reactive to light.  Extraocular movements intact. He has a top denture plate, which is intact.  NECK:  No masses. Full range of motion. No thyromegaly. No nuchal rigidity.  No lymphadenopathy.  LUNGS:  Clear to auscultation bilaterally with good air movement.  CARDIOVASCULAR:  Distant S1 and S2. No murmur, rub, or gallop noted. Regular  rate and rhythm.  ABDOMEN:  Soft, nontender, nondistended with normal active bowel sounds. He  does have some mild right lower quadrant pain but with no rebound or  guarding.  SKIN:  Without rash. He has no adenopathy.  NEUROLOGIC:  Cranial nerves 2-12 are grossly intact. Strength is 5/5 in 4  extremities. He has a diffusely enlarged prostate on rectal examination and  Hemoccult negative stool with good sphincter tone.   LABORATORY DATA:  Electrocardiogram shows sinus bradycardia with a rate of  55, first degree AV block.   Urinalysis negative.   He had cardiac enzymes that were negative. A CBC showed white count of 7.6,  hemoglobin and hematocrit of 12.3 and 34.8. Platelets of 202,000. Sodium  135, potassium 4.2, chloride 106, bicarb 26, glucose 99, BUN 15, creatinine  1.5, total bilirubin 1, alkaline phosphatase 65, SGOT 19, SGPT 13, total  protein 6.8, albumin 3.2, calcium 8.0. He had point of care enzymes that  were negative as well as 1 set of regular cardiac enzymes that were negative. His TSH was 2.124. Fecal occult blood was negative x2. At  discharge, his white count was  4.9. Hemoglobin and hematocrit 11.2 and 32.3.  Platelets of 222,000. Sodium 132, potassium 3.7, chloride 103, bicarb 24,  glucose 92, BUN 18, creatinine 1.5.   HOSPITAL COURSE:  1. The patient was admitted for a syncopal event. He had no further episodes     of syncope or pre-syncope. He had no evidence of bleeding and did not     have any arrhythmias. He did have some very low blood pressures that were  felt to be secondary to his increased dose of  terazosin and will be     discharged on home on the reduced dose of 1 mg. He underwent a 2-D     echocardiogram, which was normal and was placed on a monitor. He did have     a few episodes of bradycardia but was asymptomatic during his episodes.  2. Questionable upper gastrointestinal bleed. Hemoglobin did drop from     initial hemoglobin in the emergency room of 14, down to 11.2 at     discharge. He never had an increase in his BUN and his fecal occult blood     was negative. It is unclear whether or not there is a gastrointestinal     source but we will obtain an H-pylori test at discharge and start him on     Prevacid 30 mg b.i.d., the peptic ulcer disease dosage for treatment.     This can be followed up as an outpatient.  3. Hypotension. This is felt to be secondary to his increased dose of     terazosin and improved at discharged with the reduced dose.  4. Benign prostatic hypertrophy. His medication was initially held, then we     started it at the lower dose as noted previously. He did not have urinary     complaints during his hospitalization and plans are being made to set him     up with urology, when we can get him set up with health care sharing     initiative as an outpatient.  5. Renal insufficiency. It is unclear what his baseline creatinine is. It     remained stable at 1.5 during this hospitalization. We will await records     from his previous physician in [place], to assess for any acute and     post renal failure  on top of a chronic situation.  6. Gastroesophageal reflux disease. He was continued on Protonix during this     hospitalization and will be discharged on Prevacid 30 b.i.d. for possible     peptic ulcer disease and this will also help treat his gastroesophageal     reflux disease.  7. Paresthesias. He was started back on his Neurontin, although at a lower     dose and this can be monitored as an patient.  8. Peripheral vascular disease. His Plavix was held secondary to the     __________ that he may have had a upper GI bleed. This can be restarted     as an outpatient. His primary care doctor should followup on blood     cultures and his H-pylori testing.   CONDITION ON DISCHARGE:  The patient was discharged to home in improved  condition.   It should be noted that his enalapril was continued to be held secondary to his decreased blood pressures and elevated creatinine. This can be restarted  as an outpatient if necessary.      Ursula Beath, MD                     Deniece Portela A. Sheffield Slider, M.D.    JT/MEDQ  D:  10/06/2003  T:  10/06/2003  Job:  [id number]   cc:   [facility]  [facility]

## 2010-11-10 ENCOUNTER — Ambulatory Visit: Payer: Self-pay

## 2010-11-17 ENCOUNTER — Ambulatory Visit (INDEPENDENT_AMBULATORY_CARE_PROVIDER_SITE_OTHER): Payer: Medicare Other | Admitting: Family Medicine

## 2010-11-17 ENCOUNTER — Encounter: Payer: Self-pay | Admitting: Family Medicine

## 2010-11-17 DIAGNOSIS — R21 Rash and other nonspecific skin eruption: Secondary | ICD-10-CM | POA: Insufficient documentation

## 2010-11-17 DIAGNOSIS — K5900 Constipation, unspecified: Secondary | ICD-10-CM

## 2010-11-17 DIAGNOSIS — F329 Major depressive disorder, single episode, unspecified: Secondary | ICD-10-CM

## 2010-11-17 DIAGNOSIS — E785 Hyperlipidemia, unspecified: Secondary | ICD-10-CM

## 2010-11-17 DIAGNOSIS — M5416 Radiculopathy, lumbar region: Secondary | ICD-10-CM | POA: Insufficient documentation

## 2010-11-17 DIAGNOSIS — IMO0002 Reserved for concepts with insufficient information to code with codable children: Secondary | ICD-10-CM

## 2010-11-17 DIAGNOSIS — F3289 Other specified depressive episodes: Secondary | ICD-10-CM

## 2010-11-17 DIAGNOSIS — I10 Essential (primary) hypertension: Secondary | ICD-10-CM

## 2010-11-17 MED ORDER — TERBINAFINE HCL 1 % EX CREA: TOPICAL_CREAM | Freq: Two times a day (BID) | CUTANEOUS | Status: DC

## 2010-11-17 MED ORDER — GABAPENTIN 600 MG PO TABS: ORAL_TABLET | ORAL | Status: DC

## 2010-11-17 MED ORDER — ACETAMINOPHEN 500 MG PO TABS: 1000.0000 mg | ORAL_TABLET | Freq: Four times a day (QID) | ORAL | Status: DC | PRN

## 2010-11-17 MED ORDER — AMLODIPINE BESYLATE 5 MG PO TABS: 5.0000 mg | ORAL_TABLET | Freq: Every day | ORAL | Status: DC

## 2010-11-17 NOTE — Assessment & Plan Note (Signed)
Not currently taking Lovastatin. Checking dLDL. Will treat accordingly.

## 2010-11-17 NOTE — Patient Instructions (Signed)
Vuelva a la clinica en 6 meses.

## 2010-11-17 NOTE — Assessment & Plan Note (Addendum)
Bp well controlled.  No changes needed.

## 2010-11-17 NOTE — Assessment & Plan Note (Signed)
complains of hard stools, but hasn't been using Miralax recently

## 2010-11-17 NOTE — Assessment & Plan Note (Signed)
Improved since he and his wife traveled to visit family. Off medication

## 2010-11-17 NOTE — Assessment & Plan Note (Signed)
Suspect tinea. Will tx terbinafine crm BID.

## 2010-11-17 NOTE — Progress Notes (Signed)
  Subjective:    Patient ID: Allen [name], male    DOB: [birth year], 77 y.o.   MRN: [id number]  HPIHe continues with low back and R leg pain with numbness and paresthesias in the R leg.   He was hospitalized in [place] when visiting [place] for syncopal episodes. They decreased his blood pressure medications and he hasn't passed out since. He's not had pacemaker problems that he knows of. f  He was walking 3 blocks each morning, but recently only one. Daughter says both he and his wife are sedentary and sleep a lot.   Review of Systems     Objective:   Physical Exam  Constitutional: He appears well-developed and well-nourished.  Cardiovascular: Normal rate, regular rhythm and normal heart sounds.  Exam reveals no gallop and no friction rub.   No murmur heard. Pulmonary/Chest: Effort normal and breath sounds normal. He has no wheezes. He has no rales.  Musculoskeletal:       Hip: ROM IR: 10 Deg bilaterally, ER: 45 Deg, Flexion: 120 Deg, Extension: 100 Deg, Abduction: 45 Deg, Adduction: 45 Deg Strength IR: 5/5, ER: 5/5, Flexion: 4/5 on R, Extension: 5/5, Abduction: 5/5, Adduction: 5/5 Pelvic alignment unremarkable to inspection and palpation. Greater trochanter without tenderness to palpation. No tenderness over piriformis and greater trochanter. No pain with FABER or FADIR. No SI joint tenderness and normal minimal SI movement. Strength on R knee extension 4/5   Neurological: He is alert.  Skin: Skin is warm.       Scaling on plantar aspect feet and left hand. Great toenails thickened and yellowish          Assessment & Plan:

## 2010-11-17 NOTE — Assessment & Plan Note (Addendum)
Suspect R L3/4 based on sensory and motor manifestations. Acetaminophen 1000 mg Q6h prn. Increase gabapentin to 300 mg TID and 600mg  qPM.

## 2010-11-18 ENCOUNTER — Other Ambulatory Visit: Payer: Self-pay | Admitting: Family Medicine

## 2010-11-18 ENCOUNTER — Encounter: Payer: Self-pay | Admitting: Family Medicine

## 2010-11-18 ENCOUNTER — Other Ambulatory Visit (INDEPENDENT_AMBULATORY_CARE_PROVIDER_SITE_OTHER): Payer: Medicare Other | Admitting: Family Medicine

## 2010-11-18 DIAGNOSIS — I10 Essential (primary) hypertension: Secondary | ICD-10-CM

## 2010-11-18 DIAGNOSIS — M5416 Radiculopathy, lumbar region: Secondary | ICD-10-CM

## 2010-11-18 LAB — COMPREHENSIVE METABOLIC PANEL
Albumin: 4.3 g/dL (ref 3.5–5.2)
BUN: 20 mg/dL (ref 6–23)
CO2: 23 mEq/L (ref 19–32)
Glucose, Bld: 107 mg/dL — ABNORMAL HIGH (ref 70–99)
Sodium: 135 mEq/L (ref 135–145)
Total Bilirubin: 0.3 mg/dL (ref 0.3–1.2)
Total Protein: 7.5 g/dL (ref 6.0–8.3)

## 2010-11-18 MED ORDER — OMEPRAZOLE 20 MG PO CPDR: 40.0000 mg | DELAYED_RELEASE_CAPSULE | Freq: Every day | ORAL | Status: DC

## 2010-11-18 MED ORDER — AMLODIPINE BESYLATE 5 MG PO TABS: 5.0000 mg | ORAL_TABLET | Freq: Every day | ORAL | Status: DC

## 2010-11-18 MED ORDER — LORATADINE 10 MG PO TABS: ORAL_TABLET | ORAL | Status: DC

## 2010-11-18 MED ORDER — GABAPENTIN 600 MG PO TABS: ORAL_TABLET | ORAL | Status: DC

## 2010-11-18 NOTE — Telephone Encounter (Signed)
Refill request

## 2010-11-21 ENCOUNTER — Other Ambulatory Visit: Payer: Self-pay | Admitting: Sports Medicine

## 2010-11-21 DIAGNOSIS — M5416 Radiculopathy, lumbar region: Secondary | ICD-10-CM

## 2010-11-21 MED ORDER — GABAPENTIN 600 MG PO TABS: ORAL_TABLET | ORAL | Status: DC

## 2011-01-19 ENCOUNTER — Other Ambulatory Visit: Payer: Self-pay | Admitting: Family Medicine

## 2011-01-19 DIAGNOSIS — J438 Other emphysema: Secondary | ICD-10-CM

## 2011-01-19 MED ORDER — OMEPRAZOLE 20 MG PO CPDR: 40.0000 mg | DELAYED_RELEASE_CAPSULE | Freq: Every day | ORAL | Status: DC

## 2011-03-02 ENCOUNTER — Ambulatory Visit (INDEPENDENT_AMBULATORY_CARE_PROVIDER_SITE_OTHER): Payer: Medicare Other | Admitting: Family Medicine

## 2011-03-02 ENCOUNTER — Encounter: Payer: Self-pay | Admitting: Family Medicine

## 2011-03-02 VITALS — BP 130/76 | HR 64 | Wt 136.3 lb

## 2011-03-02 DIAGNOSIS — H9190 Unspecified hearing loss, unspecified ear: Secondary | ICD-10-CM

## 2011-03-02 DIAGNOSIS — F329 Major depressive disorder, single episode, unspecified: Secondary | ICD-10-CM

## 2011-03-02 DIAGNOSIS — N401 Enlarged prostate with lower urinary tract symptoms: Secondary | ICD-10-CM

## 2011-03-02 DIAGNOSIS — Z23 Encounter for immunization: Secondary | ICD-10-CM

## 2011-03-02 DIAGNOSIS — M5416 Radiculopathy, lumbar region: Secondary | ICD-10-CM

## 2011-03-02 DIAGNOSIS — IMO0002 Reserved for concepts with insufficient information to code with codable children: Secondary | ICD-10-CM

## 2011-03-02 DIAGNOSIS — I10 Essential (primary) hypertension: Secondary | ICD-10-CM

## 2011-03-02 NOTE — Assessment & Plan Note (Signed)
Unchanged. Advised daughter that he can safely take two more Acetaminophen daily.

## 2011-03-02 NOTE — Assessment & Plan Note (Signed)
well controlled  

## 2011-03-02 NOTE — Assessment & Plan Note (Signed)
No evidence of depression on interactions

## 2011-03-02 NOTE — Assessment & Plan Note (Signed)
Converses functionally using the hearing aide

## 2011-03-02 NOTE — Assessment & Plan Note (Signed)
Now on Tamsulosin prescribed by an urgent care?. Still awakening 5 times nightly to void.

## 2011-03-02 NOTE — Patient Instructions (Signed)
Your blood pressure is well controlled.   Your back hurts after sitting so take an Acetaminophen tablet in the afternoon and in the evening if needed.  Continue the Gabapentin the same to control nerve irritation pain.   I will call [facility] to arrange for your prescriptions  We gave you your flu shot today.We will do lab tests next visit  Return to see Dr Sheffield Slider in 3 months.

## 2011-03-02 NOTE — Progress Notes (Signed)
  Subjective:    Patient ID: Allen [name], male    DOB: 11/29/1918, 77 y.o.   MRN: [id number]  HPIHe complains of low back pain radiating into his right leg. He takes 2 Acetominophen in the AM so that he can take his walk. His grandson notes that he is stiff after sitting later in the day. He naps frequently. He reports sleeping well at night.   His grandson reports that he has some memory problems, but not bad.     Review of Systems     Objective:   Physical Exam  Constitutional:       Elderly Hispanic man who appears 10 years younger than his stated age.   HENT:  Right Ear: External ear normal.  Left Ear: External ear normal.       Normal tympanic membranes   Cardiovascular: Normal rate, regular rhythm and intact distal pulses.   No murmur heard. Pulmonary/Chest: Effort normal and breath sounds normal.  Musculoskeletal: Normal range of motion. He exhibits no edema and no tenderness.       Great muscle tone generally Decreased rom of back, but no reported pain with movements. Full range of motion of legs.    Neurological: He is alert.       SLR normal bilaterally. No weakness in legs.       Balance Abnormal Patient value  Sitting balance    Arise    Attempts to arise    Immediate standing balance    Standing balance    Nudge    Eyes closed    360 degree turn    Sitting down     Gait Abnormal Patient value  Initiation of gait    Step length-left  Mildly shortened  Step length-right    Step height-left    Step height-right    Step symmetry  No assymmetry  Step continuity    Path    Trunk    Walking stance            Assessment & Plan:

## 2011-03-19 ENCOUNTER — Ambulatory Visit (INDEPENDENT_AMBULATORY_CARE_PROVIDER_SITE_OTHER): Payer: Medicare Other | Admitting: Family Medicine

## 2011-03-19 VITALS — BP 146/96 | HR 72 | Temp 97.5°F | Wt 138.4 lb

## 2011-03-19 DIAGNOSIS — J438 Other emphysema: Secondary | ICD-10-CM

## 2011-03-19 DIAGNOSIS — E785 Hyperlipidemia, unspecified: Secondary | ICD-10-CM

## 2011-03-19 DIAGNOSIS — I10 Essential (primary) hypertension: Secondary | ICD-10-CM

## 2011-03-19 DIAGNOSIS — N401 Enlarged prostate with lower urinary tract symptoms: Secondary | ICD-10-CM

## 2011-03-19 DIAGNOSIS — IMO0002 Reserved for concepts with insufficient information to code with codable children: Secondary | ICD-10-CM

## 2011-03-19 DIAGNOSIS — J301 Allergic rhinitis due to pollen: Secondary | ICD-10-CM

## 2011-03-19 DIAGNOSIS — K219 Gastro-esophageal reflux disease without esophagitis: Secondary | ICD-10-CM

## 2011-03-19 DIAGNOSIS — K5900 Constipation, unspecified: Secondary | ICD-10-CM

## 2011-03-19 DIAGNOSIS — F329 Major depressive disorder, single episode, unspecified: Secondary | ICD-10-CM

## 2011-03-19 DIAGNOSIS — M5416 Radiculopathy, lumbar region: Secondary | ICD-10-CM

## 2011-03-19 MED ORDER — TAMSULOSIN HCL 0.4 MG PO CAPS: 0.4000 mg | ORAL_CAPSULE | Freq: Every day | ORAL | Status: DC

## 2011-03-19 MED ORDER — PRAVASTATIN SODIUM 10 MG PO TABS: 10.0000 mg | ORAL_TABLET | Freq: Every day | ORAL | Status: DC

## 2011-03-19 MED ORDER — AMLODIPINE BESYLATE 5 MG PO TABS: 5.0000 mg | ORAL_TABLET | Freq: Every day | ORAL | Status: DC

## 2011-03-19 MED ORDER — LORATADINE 10 MG PO TABS: 10.0000 mg | ORAL_TABLET | Freq: Every day | ORAL | Status: DC | PRN

## 2011-03-19 MED ORDER — ZOSTER VACCINE LIVE 19400 UNT/0.65ML ~~LOC~~ SOLR: 0.6500 mL | Freq: Once | SUBCUTANEOUS | Status: DC

## 2011-03-19 MED ORDER — OMEPRAZOLE 20 MG PO CPDR: 40.0000 mg | DELAYED_RELEASE_CAPSULE | Freq: Two times a day (BID) | ORAL | Status: DC

## 2011-03-19 MED ORDER — POLYETHYLENE GLYCOL 3350 17 G PO PACK: 17.0000 g | PACK | Freq: Every day | ORAL | Status: AC

## 2011-03-19 MED ORDER — GABAPENTIN 600 MG PO TABS: ORAL_TABLET | ORAL | Status: DC

## 2011-03-19 NOTE — Assessment & Plan Note (Signed)
Not a recent problem

## 2011-03-19 NOTE — Assessment & Plan Note (Addendum)
He's been off the pravastatin I recommended he restart this

## 2011-03-19 NOTE — Assessment & Plan Note (Addendum)
He hasn't been taking the Flomax recently but denies increased prostate symptoms  Awakening once or twice a night to urinate.  I do recommend that he restart this medication however

## 2011-03-19 NOTE — Patient Instructions (Addendum)
Regrese en 3 meses. Voy a Child psychotherapist  Return to see Dr Sheffield Slider in 3 months

## 2011-03-19 NOTE — Assessment & Plan Note (Signed)
Well-controlled at home per his daughter's report

## 2011-03-19 NOTE — Progress Notes (Signed)
  Subjective:    Patient ID: Allen [name], male    DOB: 04-14-1919, 77 y.o.   MRN: [id number]  HPI He is here for review his blood pressure medications. His daughter says his blood pressures are much lower at home.  He continues to have pain in his low back and leg, it is  improved on the gabapentin. Daughters not want to increase the dose because he is sleepy much of the day. He sleeping well at night. He does complain of some cough but no shortness of breath.  He will consider getting the Zostavax vaccine   Review of Systems     Objective:   Physical Exam  Constitutional: He appears well-developed and well-nourished.       He appears 10 years younger than his stated age  Cardiovascular: Normal rate and regular rhythm.   Pulmonary/Chest: Effort normal and breath sounds normal.  Musculoskeletal: He exhibits no edema.  Psychiatric: He has a normal mood and affect. His behavior is normal.          Assessment & Plan:

## 2011-03-20 ENCOUNTER — Inpatient Hospital Stay (HOSPITAL_COMMUNITY): Payer: Medicare Other

## 2011-03-20 ENCOUNTER — Observation Stay (HOSPITAL_COMMUNITY)
Admission: AD | Admit: 2011-03-20 | Discharge: 2011-03-21 | Disposition: A | Discharge status: Home / Self Care | Payer: Medicare Other | Source: Ambulatory Visit | Attending: Family Medicine | Admitting: Family Medicine

## 2011-03-20 ENCOUNTER — Encounter: Payer: Self-pay | Admitting: Family Medicine

## 2011-03-20 ENCOUNTER — Ambulatory Visit (INDEPENDENT_AMBULATORY_CARE_PROVIDER_SITE_OTHER): Payer: Medicare Other | Admitting: Family Medicine

## 2011-03-20 VITALS — BP 120/65 | HR 68 | Temp 99.1°F

## 2011-03-20 DIAGNOSIS — E860 Dehydration: Principal | ICD-10-CM | POA: Insufficient documentation

## 2011-03-20 DIAGNOSIS — E785 Hyperlipidemia, unspecified: Secondary | ICD-10-CM | POA: Insufficient documentation

## 2011-03-20 DIAGNOSIS — R42 Dizziness and giddiness: Secondary | ICD-10-CM | POA: Insufficient documentation

## 2011-03-20 DIAGNOSIS — G609 Hereditary and idiopathic neuropathy, unspecified: Secondary | ICD-10-CM | POA: Insufficient documentation

## 2011-03-20 DIAGNOSIS — R5381 Other malaise: Secondary | ICD-10-CM | POA: Insufficient documentation

## 2011-03-20 DIAGNOSIS — R5383 Other fatigue: Secondary | ICD-10-CM | POA: Insufficient documentation

## 2011-03-20 DIAGNOSIS — K219 Gastro-esophageal reflux disease without esophagitis: Secondary | ICD-10-CM | POA: Insufficient documentation

## 2011-03-20 DIAGNOSIS — R269 Unspecified abnormalities of gait and mobility: Secondary | ICD-10-CM

## 2011-03-20 DIAGNOSIS — N400 Enlarged prostate without lower urinary tract symptoms: Secondary | ICD-10-CM | POA: Insufficient documentation

## 2011-03-20 DIAGNOSIS — R531 Weakness: Secondary | ICD-10-CM

## 2011-03-20 DIAGNOSIS — I10 Essential (primary) hypertension: Secondary | ICD-10-CM | POA: Insufficient documentation

## 2011-03-20 DIAGNOSIS — R29898 Other symptoms and signs involving the musculoskeletal system: Secondary | ICD-10-CM | POA: Insufficient documentation

## 2011-03-20 DIAGNOSIS — Z950 Presence of cardiac pacemaker: Secondary | ICD-10-CM | POA: Insufficient documentation

## 2011-03-20 LAB — CBC
HCT: 32.6 % — ABNORMAL LOW (ref 39.0–52.0)
Hemoglobin: 11.2 g/dL — ABNORMAL LOW (ref 13.0–17.0)
MCHC: 34.4 g/dL (ref 30.0–36.0)
RDW: 15.1 % (ref 11.5–15.5)
WBC: 15.7 10*3/uL — ABNORMAL HIGH (ref 4.0–10.5)

## 2011-03-20 LAB — COMPREHENSIVE METABOLIC PANEL
ALT: 10 U/L (ref 0–53)
Albumin: 3.4 g/dL — ABNORMAL LOW (ref 3.5–5.2)
Alkaline Phosphatase: 57 U/L (ref 39–117)
BUN: 23 mg/dL (ref 6–23)
Chloride: 97 mEq/L (ref 96–112)
Potassium: 5.1 mEq/L (ref 3.5–5.1)
Sodium: 131 mEq/L — ABNORMAL LOW (ref 135–145)
Total Bilirubin: 0.6 mg/dL (ref 0.3–1.2)
Total Protein: 6.9 g/dL (ref 6.0–8.3)

## 2011-03-20 LAB — CARDIAC PANEL(CRET KIN+CKTOT+MB+TROPI)
Relative Index: INVALID (ref 0.0–2.5)
Total CK: 87 U/L (ref 7–232)

## 2011-03-20 LAB — GLUCOSE, CAPILLARY: Glucose-Capillary: 101 mg/dL — ABNORMAL HIGH (ref 70–99)

## 2011-03-20 NOTE — Progress Notes (Signed)
Family Medicine Teaching [facility] Admission History and Physical  Patient name: Allen [name] Medical record number: [id number] Date of birth: 1919-05-01 Age: 77 y.o. Gender: male  Primary Care Provider: Tobin Chad, MD  Chief Complaint: Fatigue and weakness History of Present Illness: Allen [name] is a 77 y.o. year old male presenting with fatigue and weakness.  Was in normal state of health yesterday. He went to the doctor's office for a checkup and no changes were made.  Last night he had some heartburn so he took TUMS and omeprazole.  Following to his medication he noted restlessness and feeling like he had to move.  He did not sleep well. This morning he woke and felt very fatigued and weak.  He also notes feeling shaky and lightheaded. The symptoms worsened that he eventually presented to the doctor's office this afternoon.  He denies any chest pain palpitations syncope fevers chills nausea vomiting or diarrhea.  He is accompanied by his daughter-in-law who does not live with him. Together they both think that he is taking all the medications listed below in the doses listed below.  No new medication.     Patient Active Problem List  Diagnoses  . HYPERLIPIDEMIA  . DEPRESSION  . RESTLESS LEGS SYNDROME  . LOSS, HEARING NOS  . HYPERTENSION, BENIGN  . SECOND DEGREE ATRIOVENTRICULAR HEART BLOCK  . ALLERGIC RHINITIS, SEASONAL  . GASTROESOPHAGEAL REFLUX, NO ESOPHAGITIS  . CONSTIPATION  . BENIGN PROSTATIC HYPERTROPHY, WITH OBSTRUCTION  . Skin rash  . Lumbar radiculopathy, chronic   Past Medical History: See above  Past Surgical History: Past Surgical History  Procedure Date  . Testicle surgery 1933  . Inguinal hernia repair 1980  . Pacemaker placement 03/2009    syncope - bradycardia, and 2nd degree block     Social History: History   Social History  . Marital Status: Married    Spouse Name: Raquel    Number of Children: 10  . Years of Education: 2     Social History Main Topics  . Smoking status: Former Games developer  . Smokeless tobacco: None  . Alcohol Use: No  . Drug Use: No  . Sexually Active: None   Other Topics Concern  . 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 3 children in [place], lives with wife and son.daugher, Jacky Kindle, is main caregiver. Can't read Spanish    Family History: No family history on file.  Allergies: No Known Allergies  Current Outpatient Prescriptions  Medication Sig Dispense Refill  . acetaminophen (TYLENOL) 500 MG tablet Take 2 tablets (1,000 mg total) by mouth every 6 (six) hours as needed.  100 tablet  3  . amLODipine (NORVASC) 5 MG tablet Take 1 tablet (5 mg total) by mouth daily. For high blood pressure. Label in Spanish.  90 tablet  3  . aspirin EC 81 MG tablet Take 81 mg by mouth daily.        . Fish Oil OIL Take 1 capsule by mouth 3 (three) times daily.        Marland Kitchen gabapentin (NEURONTIN) 600 MG tablet Take 300mg  (1/2 tablet) TID and 600mg  (1 tablet) at bedtime.  225 tablet  3  . loratadine (CLARITIN) 10 MG tablet Take 1 tablet (10 mg total) by mouth daily as needed for allergies. One daily as needed    90 tablet  3  . omeprazole (PRILOSEC) 20 MG capsule Take  2 capsules (40 mg total) by mouth 2 (two) times daily.  180 capsule  3  . polyethylene glycol (MIRALAX / GLYCOLAX) packet Take 17 g by mouth daily.  14 each  0  . pravastatin (PRAVACHOL) 10 MG tablet Take 1 tablet (10 mg total) by mouth daily.  90 tablet  3  . Tamsulosin HCl (FLOMAX) 0.4 MG CAPS Take 1 capsule (0.4 mg total) by mouth daily.  90 capsule  3  . zoster vaccine live, PF, (ZOSTAVAX) 16109 UNT/0.65ML injection Inject 19,400 Units into the skin once.  1 vial  0   Review Of Systems: Per HPI  Otherwise 12 point review of systems was performed and was unremarkable.  Physical Exam: Exam:  BP 120/65   Pulse 68  Temp(Src) 99.1 F (37.3 C) (Oral) Gen: Well NAD, elderly male unable to walk HEENT: EOMI,  MMM Lungs: CTABL Nl WOB Heart: RRR no MRG Abd: NABS, NT, ND Exts: Non edematous BL  LE, warm and well perfused.  Neuro: Alert conversant in Spanish. Able to slowly stand up from wheelchair but unable to take a few steps. No focal neurologic changes.   Labs and Imaging:  CBG: 101  Assessment and Plan: Allen [name] is a 77 y.o. year old male presenting with weakness 1. Weakness: Unsure of etiology. His diagnosis is so uncertain at this point and patient is essentially unable to walk and 77 years old with feel the best course of action is placement observation status overnight to further workup in a safe environment.   Likely source is dehydration versus, arrhythmia versus urinary tract infection versus polypharmacy Plan to work up with comprehensive metabolic panel CBC EKG urinalysis and placement on telemetry floor. 2. history of AV block: Will follow telemetry and obtain EKG. If abnormal we'll consult cardiology. 3 reflux: Will place patient on Protonix. 4 FEN/GI: Saline lock IV and ad lib. normal diet 5 Prophylaxis: Heparin 6 Disposition: Observation status  [phone]

## 2011-03-21 LAB — CBC
MCV: 84.6 fL (ref 78.0–100.0)
Platelets: 177 10*3/uL (ref 150–400)
RBC: 3.69 MIL/uL — ABNORMAL LOW (ref 4.22–5.81)
RDW: 15.1 % (ref 11.5–15.5)
WBC: 9.8 10*3/uL (ref 4.0–10.5)

## 2011-03-21 LAB — CARDIAC PANEL(CRET KIN+CKTOT+MB+TROPI)
CK, MB: 2.2 ng/mL (ref 0.3–4.0)
CK, MB: 2.5 ng/mL (ref 0.3–4.0)
Relative Index: INVALID (ref 0.0–2.5)
Relative Index: INVALID (ref 0.0–2.5)
Total CK: 66 U/L (ref 7–232)
Total CK: 72 U/L (ref 7–232)
Troponin I: <0.3 ng/mL (ref <0.30)
Troponin I: <0.3 ng/mL (ref <0.30)

## 2011-03-21 LAB — URINALYSIS, MICROSCOPIC ONLY
Ketones, ur: 15 mg/dL — AB
Leukocytes, UA: NEGATIVE
Nitrite: NEGATIVE
Protein, ur: NEGATIVE mg/dL
Urobilinogen, UA: 1 mg/dL (ref 0.0–1.0)

## 2011-03-21 LAB — BASIC METABOLIC PANEL
Chloride: 98 mEq/L (ref 96–112)
Creatinine, Ser: 1.57 mg/dL — ABNORMAL HIGH (ref 0.50–1.35)
GFR calc Af Amer: 43 mL/min — ABNORMAL LOW (ref >90)
Sodium: 133 mEq/L — ABNORMAL LOW (ref 135–145)

## 2011-03-21 LAB — TSH: TSH: 6.627 u[IU]/mL — ABNORMAL HIGH (ref 0.350–4.500)

## 2011-03-26 ENCOUNTER — Ambulatory Visit (INDEPENDENT_AMBULATORY_CARE_PROVIDER_SITE_OTHER): Payer: Medicare Other | Admitting: Family Medicine

## 2011-03-26 ENCOUNTER — Encounter: Payer: Self-pay | Admitting: Family Medicine

## 2011-03-26 VITALS — BP 169/80 | HR 78 | Temp 97.6°F | Ht 63.0 in | Wt 138.0 lb

## 2011-03-26 DIAGNOSIS — I10 Essential (primary) hypertension: Secondary | ICD-10-CM

## 2011-03-26 DIAGNOSIS — K219 Gastro-esophageal reflux disease without esophagitis: Secondary | ICD-10-CM

## 2011-03-26 DIAGNOSIS — D649 Anemia, unspecified: Secondary | ICD-10-CM

## 2011-03-26 DIAGNOSIS — R7989 Other specified abnormal findings of blood chemistry: Secondary | ICD-10-CM | POA: Insufficient documentation

## 2011-03-26 DIAGNOSIS — R5381 Other malaise: Secondary | ICD-10-CM

## 2011-03-26 DIAGNOSIS — R531 Weakness: Secondary | ICD-10-CM

## 2011-03-26 DIAGNOSIS — N401 Enlarged prostate with lower urinary tract symptoms: Secondary | ICD-10-CM

## 2011-03-26 LAB — SEDIMENTATION RATE: Sed Rate: 36 mm/hr — ABNORMAL HIGH (ref 0–16)

## 2011-03-26 LAB — CBC
MCH: 28.7 pg (ref 26.0–34.0)
MCHC: 32.8 g/dL (ref 30.0–36.0)
Platelets: 289 10*3/uL (ref 150–400)
RBC: 4.18 MIL/uL — ABNORMAL LOW (ref 4.22–5.81)

## 2011-03-26 LAB — T4, FREE: Free T4: 1.16 ng/dL (ref 0.80–1.80)

## 2011-03-26 NOTE — Patient Instructions (Addendum)
Para determinar la causa de la debilidad, deje de tomar Pravastatin y Flomax (Tamsulosin). Probablemente vamos a empezar los separados  Stop taking the Pravastatin and Flomax. We may restart them later  Voy a llamarle con los resultados de [address] de [place].  I'll call with the lab results  Regrese en dos semanas   Return to see Dr Sheffield Slider in 2 weeks

## 2011-03-26 NOTE — Assessment & Plan Note (Signed)
Will recheck CBC and do ESR. Stool negative for blood today

## 2011-03-26 NOTE — Assessment & Plan Note (Signed)
Will observe for dysuria off the Tamsulosin. At his age the prostate nodule likely represents prostate CA. If the PSA is very high, will need to scan him for metastases, although he only complains of his chronic back pain. Might start with LS spine and pelvis film.

## 2011-03-26 NOTE — Progress Notes (Signed)
  Subjective:    Patient ID: Allen [name], male    DOB: Sep 02, 1918, 77 y.o.   MRN: [id number]  HPISince discharged from the hospital 10/13 he has continued to feel week, particularly in the legs. He had restarted the Flomax and Pravastatin as directed the evening before his weakness began and continues taking them.  He had slept poorly the night before his admission due to severe water brash despite being on the Omeprazole. He also had difficulty with feeling generally uncomfortable. He has been taking Ensure between meals  He finished the Avelox yesterday. Never developed cough or other symptoms of infection. The WBC was normal during the hospitalization.   His hemoglobin had decreased into the anemia range since last year. He hasn't had changes in his bowel movements.  He continues his right low back pain that radiates down to his right lower leg. He takes the Gabapentin as directed, but chronically has been very sleepy during the day.     Review of Systems  Constitutional: Positive for activity change and appetite change.  HENT: Negative for congestion and sore throat.   Respiratory: Negative for cough, shortness of breath and wheezing.   Cardiovascular: Negative for chest pain.  Gastrointestinal: Positive for nausea. Negative for abdominal pain.  Genitourinary: Negative for dysuria.  Musculoskeletal: Positive for arthralgias.  Skin: Positive for rash.  Neurological: Positive for weakness.       Objective:   Physical Exam  HENT:  Mouth/Throat: Oropharynx is clear and moist.  Neck: No thyromegaly present.  Cardiovascular: Normal rate, regular rhythm and normal heart sounds.   No murmur heard. Pulmonary/Chest: Effort normal and breath sounds normal.  Abdominal: Soft. Bowel sounds are normal. He exhibits no distension and no mass. There is no tenderness. There is no rebound and no guarding.  Genitourinary: Guaiac positive stool.       Prostate 2+ enlarged with firm  nodular feel to most of R lobe  Lymphadenopathy:    He has no cervical adenopathy.  Neurological: He is alert. No cranial nerve deficit. Coordination normal.  Psychiatric:       Dysphoric affect   Alert, not acutely ill        Assessment & Plan:

## 2011-03-26 NOTE — Assessment & Plan Note (Signed)
Elevated today, but will observe

## 2011-03-26 NOTE — Assessment & Plan Note (Signed)
Since it started after restarting Flomax and Pravastatin, will hold these.  May be multifactorial

## 2011-03-26 NOTE — H&P (Addendum)
[name], ROUT NO.:  [id number]  MEDICAL RECORD NO.:  [id number]  LOCATION:  4705                         FACILITY:  [facility]  PHYSICIAN:  Nestor Ramp, MD        DATE OF BIRTH:  [birth year]  DATE OF ADMISSION:  03/20/2011 DATE OF DISCHARGE:                             HISTORY & PHYSICAL   PRIMARY CARE PROVIDERS:  Wayne A. Sheffield Slider, MD, at [facility].  CHIEF COMPLAINT:  Fatigue and weakness.  HISTORY OF PRESENT ILLNESS:  Mr. Allen [name] is 77 year old male who presents with fatigue and weakness.  He was in his normal state of health yesterday.  He went to the doctor's office for a checkup and no changes were made at that visit.  Over last night, he had some heartburn, he took Tums, and omeprazole.  Following his medication, he noted restlessness and felt like he had to move a lot.  He did not sleep well that night.  This morning, he awoke and felt very fatigued and weak.  He also was feeling shaky and lightheaded.  The symptoms are worsened and he eventually presented to the Director's Office this afternoon.  He denies any chest pain, palpitations, syncope, fever, chills, nausea, vomiting, or diarrhea.  He is accompanied by his daughter-in-law who does not live with him.  Together they both think that he is taking all the medications listed below and the dose is listed below.  No new medications.  PAST MEDICAL HISTORY: 1. Hyperlipidemia. 2. Depression. 3. Restless legs syndrome. 4. Hearing loss. 5. Hypertension. 6. Second-degree AV block. 7. Allergic rhinitis. 8. GERD. 9. Constipation. 10.BPH with obstruction. 11.Skin rash. 12.Lumbar chronic radiculopathy.  SURGICAL HISTORY: 1. Testicular surgery. 2. Inguinal hernia repair. 3. Pacemaker placement.  SOCIAL HISTORY:  Former smoker, lives in [place], born in [place].  Allen [name] is the main caregiver.  He cannot read [place].  FAMILY HISTORY:   Noncontributory.  ALLERGIES:  No known drug allergies.  MEDICATIONS: 1. Tylenol 1 g q.6 p.r.n. 2. Amlodipine 5 mg daily. 3. Aspirin 81 daily. 4. Fish oil 1 capsule 3 times a day. 5. Neurontin 300 mg in the morning, 300 mg in the afternoon, and 600     mg at night. 6. Claritin 10 mg daily as needed. 7. Omeprazole 40 mg by mouth twice a day. 8. MiraLax 17 g daily. 9. Pravastatin 10 mg daily. 10.Flomax 0.4 mg daily.  REVIEW OF SYSTEMS:  Please see HPI otherwise normal.  PHYSICAL EXAMINATION:  VITAL SIGNS:  Temperature 99.1, heart rate 68, blood pressure 120/65.  GENERALLY:  He is well in no acute distress, elderly male.  He is unable to get up to walk. HEENT:  Shows EOMI with moist mucous membranes. LUNGS:  Clear to auscultation bilaterally with normal work of breathing. HEART:  Regular rate and rhythm.  No murmurs, rubs, or gallops. ABDOMEN:  Soft, nontender, nondistended. EXTREMITIES:  Nonedematous bilateral lower extremities, warm and well perfused. NEUROLOGIC:  Alert, conversant in Spanish, able to slowly stand up from the wheelchair, but unable to take even a few steps.  No focal neurologic changes.  LABS AND STUDIES:  In office, CBG was 101.  ASSESSMENT AND PLAN:  A 77 year old male with weakness.  1. Weakness, unsure of etiology, this diagnosis is so uncertain at     this point. The patient is essentially unable to walk and is 64-     year-old.  Discussed this with family members in attendance we feel     the best course of action is placement in the hospital observation     status overnight to further workup and a safe environment.  I feel     like his likely source his dehydration versus arrhythmia versus     urinary tract infection versus polypharmacy.  Plan to workup with     comprehensive metabolic panel, CBC, EKG, cardiac enzymes, urinalysis, and placement     on telemetry floor. 2. History of AV block, we will follow telemetry and obtain EKG, if     abnormal,  we will consult Cardiology. 3. Reflux.  We will start placing patient on Protonix. 4. Fluids, electrolytes, and nutrition, and gastrointestinal.  Saline     lock IV, ad lib p.o. and normal diet. 5. Prophylaxis heparin. 6. Disposition, observation status.     Clementeen Graham, MD   ______________________________ Denny Levy, MD    SLN/MEDQ  D:  03/20/2011  T:  03/20/2011  Job:  [id number]  Electronically Signed by Denny Levy MD on 03/26/2011 10:13:17 AM Electronically Signed by Clementeen Graham  on 04/07/2011 04:58:34 PM

## 2011-03-27 LAB — PSA: PSA: 7.79 ng/mL — ABNORMAL HIGH (ref <=4.00)

## 2011-03-27 NOTE — Discharge Summary (Signed)
Allen [name], STOLZ NO.:  [id number]  MEDICAL RECORD NO.:  [id number]  LOCATION:  4705                         FACILITY:  [facility]  PHYSICIAN:  Paula Compton, MD        DATE OF BIRTH:  1918-09-13  DATE OF ADMISSION:  03/20/2011 DATE OF DISCHARGE:  03/21/2011                              DISCHARGE SUMMARY   PRIMARY CARE PHYSICIAN:  Wayne A. Sheffield Slider, MD at [facility].  REASON FOR HOSPITALIZATION: 1. Fatigue and weakness   DISCHARGE DIAGNOSES: 1. Dehydration. 2. History of atrioventricular block. 3. Reflux. 4. Hypertension. 5. Peripheral neuropathy. 6. Hyperlipidemia. 7. Benign prostatic hypertrophy.  BRIEF HOSPITAL COURSE:  Mr. Allen [name] was seen at [facility] with complaints of fatigue and weakness.  He had been feeling well until the prior day when he went to see his primary care physician, and there are no changes at that time other than the addition of pravastatin.  He did not sleep well the night prior and additionally has some heartburn and woke up the day of admission very fatigued and weak.  He was feeling shaky and lightheaded.  He did go to the primary care physician's office for these complaints and was found to have a leukocytosis and was felt appropriate for admission for observation due to his inability to walk and his advanced age.  During this hospitalization, chest x-ray revealed no acute cardiopulmonary disease. Cycling cardiac enzymes revealed noncardiac etiology.  His leukocytosis resolved, but was shown to be moderately dehydrated with urinalysis showing specific gravity of 1.031 with 15 ketones and small bilirubin. The urinalysis was unremarkable except for hyaline casts.  On the day of discharge, the patient was ambulating well without oxygen, although he did have an oxygen requirement upon admission, this was discontinued prior to discharge.  He was able to ambulate without  difficulty.  DISCHARGE MEDICATIONS:  His home medications will be resumed with the only addition of 7-day course of Avelox.  There is concern that he may have a pneumonia that was not present due to his dehydration and felt best to cover him empirically for this.  Home medications to continue include: 1. Pravastatin 10 mg 1 tablet p.o. daily. 2. Flomax 0.4 mg p.o. daily. 3. Acetaminophen 2 tablets p.o. q.6 hours as needed. 4. Amlodipine 5 mg 1 tablet p.o. daily. 5. Aspirin 81 mg 1 tablet p.o. daily. 6. Gabapentin 300 mg t.i.d. and 600 mg at bedtime p.o. 7. Omeprazole 20 mg 2 capsules p.o. daily.  PENDING LABS AT THE TIME OF DISCHARGE:  None.  THE PATIENT'S CONDITION AT THE TIME OF DISCHARGE:  Improved.  FOLLOWUP:  He is call [facility] on Monday to schedule a followup sometime next week.  He has been instructed that if he has any return of weakness to try orally rehydrating himself and if persists, to call the emergency line of [facility].  FOLLOWUP ISSUES:  Address his blood pressures as these have reportedly been low at home.  He was stable through his hospitalization although he did have some systolic blood pressures into the 90s.  He was asymptomatic at the time of discharge.  ______________________________ Gaspar Bidding, DO   ______________________________ Paula Compton, MD    MR/MEDQ  D:  03/21/2011  T:  03/21/2011  Job:  [id number]  Electronically Signed by Gaspar Bidding  on 03/24/2011 02:03:38 PM Electronically Signed by Paula Compton MD on 03/27/2011 02:29:27 PM

## 2011-04-10 ENCOUNTER — Encounter: Payer: Self-pay | Admitting: Family Medicine

## 2011-04-16 ENCOUNTER — Encounter: Payer: Self-pay | Admitting: Family Medicine

## 2011-04-16 ENCOUNTER — Ambulatory Visit (INDEPENDENT_AMBULATORY_CARE_PROVIDER_SITE_OTHER): Payer: Medicare Other | Admitting: Family Medicine

## 2011-04-16 ENCOUNTER — Ambulatory Visit: Payer: Medicare Other | Admitting: Family Medicine

## 2011-04-16 VITALS — BP 122/76 | HR 79 | Temp 97.9°F | Ht 63.0 in | Wt 139.0 lb

## 2011-04-16 DIAGNOSIS — E785 Hyperlipidemia, unspecified: Secondary | ICD-10-CM

## 2011-04-16 DIAGNOSIS — N401 Enlarged prostate with lower urinary tract symptoms: Secondary | ICD-10-CM

## 2011-04-16 DIAGNOSIS — I10 Essential (primary) hypertension: Secondary | ICD-10-CM

## 2011-04-16 DIAGNOSIS — D649 Anemia, unspecified: Secondary | ICD-10-CM

## 2011-04-16 DIAGNOSIS — M5416 Radiculopathy, lumbar region: Secondary | ICD-10-CM

## 2011-04-16 DIAGNOSIS — R7989 Other specified abnormal findings of blood chemistry: Secondary | ICD-10-CM

## 2011-04-16 DIAGNOSIS — IMO0002 Reserved for concepts with insufficient information to code with codable children: Secondary | ICD-10-CM

## 2011-04-16 MED ORDER — PRAVASTATIN SODIUM 10 MG PO TABS: 10.0000 mg | ORAL_TABLET | Freq: Every day | ORAL | Status: DC

## 2011-04-16 NOTE — Assessment & Plan Note (Signed)
He will restart Pravastatin to see if weakness recurs

## 2011-04-16 NOTE — Patient Instructions (Signed)
Empiece otra vez Pavastatin para colesterol  No tome Tamsulosin, pero guardala.   Regrese misma vez que su esposa.  Return in 5-6 months. Sooner if needed.

## 2011-04-16 NOTE — Assessment & Plan Note (Signed)
well controlled on recheck 

## 2011-04-16 NOTE — Progress Notes (Signed)
  Subjective:    Patient ID: Allen [name], male    DOB: Mar 07, 1919, 77 y.o.   MRN: [id number]  HPI Interview conducted in [place]. Daughter present.  He hasn't had more weak spells. Continues with back pain radiating into the right legs when active, but not bad when standing or sitting. Doesn't keep him awake at night. Continues with the Gabapentin. Someone gave his daughter Acetaminophen 650 SR tabs and she asks how to use them.  She is stressed because her parents are her responsibility with little help from her siblings. They have Medicare, but don't receive Social Security.   He awakens several times nightly to urinate, but drinks fluids before going to bed.   He will get GERD if he eats too close to bedtime.     Review of Systems  Constitutional: Negative for appetite change.  Musculoskeletal: Positive for back pain and arthralgias. Negative for gait problem.  Psychiatric/Behavioral: Positive for dysphoric mood.       Objective:   Physical Exam  Constitutional: He appears well-developed and well-nourished.  Cardiovascular: Normal rate and regular rhythm.   Pulmonary/Chest: Effort normal and breath sounds normal.  Neurological: He is alert.  Psychiatric: He has a normal mood and affect. His behavior is normal.          Assessment & Plan:

## 2011-04-16 NOTE — Assessment & Plan Note (Signed)
He continues with mild symptoms and didn't worsen off the Flomax. He will continue to hold off taking that.

## 2011-04-16 NOTE — Assessment & Plan Note (Signed)
Decreased by Gabapentin. May take The maximum daily dose of acetaminophen was discussed with the patient. He was encouraged not to exceed2,000 mg of acetaminophen during a 24 hour period and was asked to keep in mind that acetaminophen can also be found in many over-the-counter cold medications as well as narcotics that may be given for pain. The patient expresses understanding of these issues and questions were answered.

## 2011-08-25 ENCOUNTER — Encounter: Payer: Self-pay | Admitting: Family Medicine

## 2011-08-25 ENCOUNTER — Ambulatory Visit (INDEPENDENT_AMBULATORY_CARE_PROVIDER_SITE_OTHER): Payer: Medicare Other | Admitting: Family Medicine

## 2011-08-25 VITALS — BP 131/80 | HR 91 | Temp 98.3°F | Ht 63.0 in | Wt 139.5 lb

## 2011-08-25 DIAGNOSIS — R1110 Vomiting, unspecified: Secondary | ICD-10-CM | POA: Insufficient documentation

## 2011-08-25 MED ORDER — ONDANSETRON HCL 4 MG PO TABS: 4.0000 mg | ORAL_TABLET | Freq: Three times a day (TID) | ORAL | Status: AC | PRN

## 2011-08-25 NOTE — Patient Instructions (Addendum)
It was nice to meet you today. Hopefully you just have a stomach virus.  I am sending in a prescription for some medicine to help with the nausea. Try to encourage fluids and bland foods until he gets over this acute illness. Also, pay close attention to his bowel movements and make sure that they are not bloody and do not continue to be dark black. Please have him follow up in 2 days, just to make sure he is doing ok. Be seen sooner if the vomiting gets worse, he cannot keep down any fluids, if he has blood in his vomit or stool, or if he starts having bad abdominal pain.   Gastroenteritis viral (Viral Gastroenteritis) La gastroenteritis viral tambin es conocida como gripe del [place]. Este trastorno [employer] y el tubo digestivo. Puede causar diarrea y vmitos repentinos. La enfermedad generalmente dura entre 3 y [address]. La [employer] de las personas desarrolla una respuesta inmunolgica. Con el tiempo, esto elimina el virus. Mientras se desarrolla esta respuesta natural, el virus puede afectar en forma importante su salud.  CAUSAS Muchos virus diferentes pueden causar gastroenteritis, por ejemplo el rotavirus o el norovirus. Estos virus pueden contagiarse al consumir alimentos o agua contaminados. Tambin puede contagiarse al compartir utensilios u otros artculos personales con una persona infectada o al tocar una superficie contaminada.  SNTOMAS Los sntomas ms comunes son diarrea y vmitos. Estos problemas pueden causar una prdida grave de lquidos corporales(deshidratacin) y un desequilibrio de sales corporales(electrolitos). Otros sntomas pueden ser:   Grant Ruts.   Dolor de [place].   Fatiga.   Dolor abdominal.  DIAGNSTICO  El mdico podr hacer el diagnstico de gastroenteritis viral basndose en los sntomas y el examen fsico Tambin pueden tomarle una muestra de materia fecal para diagnosticar la presencia de virus u otras infecciones.  TRATAMIENTO Esta enfermedad  generalmente desaparece sin tratamiento. Los tratamientos estn dirigidos a Social research officer, government. Los casos ms graves de gastroenteritis viral implican vmitos tan intensos que no es posible retener lquidos. En [employer], los lquidos deben administrarse a travs de una va intravenosa (IV).  INSTRUCCIONES PARA EL CUIDADO DOMICILIARIO  Beba suficientes lquidos para mantener la orina clara o de color amarillo plido. Beba pequeas cantidades de lquido con frecuencia y aumente la cantidad segn la tolerancia.   Pida instrucciones especficas a su mdico con respecto a la rehidratacin.   Evite:   Alimentos que Nurse, adult.   Alcohol.   Gaseosas.   TabacoVista Lawman.   Bebidas con cafena.   Lquidos muy calientes o fros.   Alimentos muy grasos.   Comer demasiado a Licensed conveyancer.   Productos lcteos hasta 24 a 48 horas despus de que se detenga la diarrea.   Puede consumir probiticos. Los probiticos son cultivos activos de bacterias beneficiosas. Pueden disminuir la cantidad y el nmero de deposiciones diarreicas en el adulto. Se encuentran en los yogures con cultivos activos y en los suplementos.   Lave bien sus manos para evitar que se disemine el virus.   Slo tome medicamentos de venta libre o recetados para Primary school teacher, las molestias o bajar la fiebre segn las indicaciones de su mdico. No administre aspirina a los nios. Los medicamentos antidiarreicos no son recomendables.   Consulte a su mdico si puede seguir tomando sus medicamentos recetados o de [employer].   Cumpla con todas las visitas de control, segn le indique su mdico.  SOLICITE ATENCIN MDICA DE INMEDIATO SI:  No puede retener lquidos.  No hay emisin de orina durante 6 a 8 horas.   Le falta el aire.   Observa sangre en el vmito (se ve como caf molido) o en la materia fecal.   Siente dolor abdominal que empeora o se concentra en una zona pequea (se localiza).   Tiene nuseas o  vmitos persistentes.   Tiene fiebre.   El paciente es un nio menor de 3 meses y [place].   El paciente es un nio mayor de 3 meses, tiene fiebre y sntomas persistentes.   El paciente es un nio mayor de 3 meses y tiene fiebre y sntomas que empeoran repentinamente.   El paciente es un beb y no tiene lgrimas cuando llora.  ASEGRESE QUE:   Comprende estas instrucciones.   Controlar su enfermedad.   Solicitar ayuda inmediatamente si no mejora o si empeora.  Document Released: 05/25/2005 Document Revised: 05/14/2011 [facility] Patient Information 2012 [place], [place].

## 2011-08-25 NOTE — Assessment & Plan Note (Signed)
Some concern for bilious emesis with report of dark green color, but patient with normal BM this AM, no abdominal pain or discomfort, normal bowel sounds throughout, so this is less likely.  Most likely due to viral gastroenteritis.  Patient does not appear dehydrated today.  Will Rx Zofran and encourage fluids and bland foods.  Pt to f/u in 2 days for recheck.  Asked pt to monitor for blood in stool or emesis as well as if emesis remains dark green or if he is no longer having BMs or flatus.

## 2011-08-25 NOTE — Progress Notes (Signed)
S: Pt comes in today for vomiting and diarrhea.  Patient started having diarrhea yesterday, it was not bloody but was dark at times.  He then started having vomiting last night after eating a piece of cantaloupe, which made his stomach feel funny.  He has had multiple episodes of nonbloody emesis, most recently at 6am.  He does state that it is a dark green in color.  He had a normal bowel movement this morning.  He is able to keep fluids/foods down for a short period of time before vomiting.  He denies nausea or abdominal pain.  He denies dysuria or increased urinary frequency.  He denies fevers or chills.  He has not had any  rhinorrhea, or cough.  He does c/o his throat being sore but attributes this to vomiting.  No known sick contacts.   ROS: Per HPI  History  Smoking status  . Former Smoker  Smokeless tobacco  . Not on file    O:  Filed Vitals:   08/25/11 1015  BP: 131/80  Pulse: 91  Temp: 98.3 F (36.8 C)    Gen: NAD HEENT: MMM, no pharyngeal erythema or exudate, minimal cervical LAD CV: RRR, no murmur Pulm: CTA bilat, no wheezes or crackles Abd: soft, NT, +BS throughout, nondistended    A/P: 78 y.o. male p/w likely viral gastroenteritis  -See problem list -f/u in 2 days

## 2011-08-27 ENCOUNTER — Ambulatory Visit: Payer: Medicare Other | Admitting: Family Medicine

## 2011-09-22 ENCOUNTER — Encounter: Payer: Self-pay | Admitting: Family Medicine

## 2011-09-22 ENCOUNTER — Ambulatory Visit (INDEPENDENT_AMBULATORY_CARE_PROVIDER_SITE_OTHER): Payer: Medicare Other | Admitting: Family Medicine

## 2011-09-22 VITALS — BP 124/72 | HR 66 | Ht 63.0 in | Wt 136.0 lb

## 2011-09-22 DIAGNOSIS — M5416 Radiculopathy, lumbar region: Secondary | ICD-10-CM

## 2011-09-22 DIAGNOSIS — I10 Essential (primary) hypertension: Secondary | ICD-10-CM | POA: Diagnosis not present

## 2011-09-22 DIAGNOSIS — K5900 Constipation, unspecified: Secondary | ICD-10-CM

## 2011-09-22 DIAGNOSIS — IMO0002 Reserved for concepts with insufficient information to code with codable children: Secondary | ICD-10-CM

## 2011-09-22 MED ORDER — TRAMADOL HCL 50 MG PO TABS: 50.0000 mg | ORAL_TABLET | Freq: Three times a day (TID) | ORAL | Status: DC | PRN

## 2011-09-22 NOTE — Progress Notes (Signed)
  Subjective:    Patient ID: Allen [name], male    DOB: 17-Aug-1918, 78 y.o.   MRN: [id number]  HPI Only current problem is low back to left leg pain, worse after working in the garden.   Fair appetite, daughter supplementing with Ensure   Review of Systems     Objective:   Physical Exam  Cardiovascular: Normal rate and regular rhythm.   No murmur heard. Pulmonary/Chest: Effort normal and breath sounds normal.  Musculoskeletal:       Can't flex past 45 degrees due to low back pain, but good posture without scoliosis. Pain goes into sciatic area.  SLR positiver at 30 degrees on right     Neurological:       Walks without foot drop          Assessment & Plan:

## 2011-09-22 NOTE — Assessment & Plan Note (Signed)
well controlled  

## 2011-09-22 NOTE — Assessment & Plan Note (Signed)
Change to ER Acetaminophen and add Tramadol

## 2011-09-22 NOTE — Patient Instructions (Addendum)
Cambiar Acetaminophen a los de 650 para tomar uno cada 8 horas\ Si el dolor no esta controlado, tomar Tramadol uno cada 8 horas.   Continue los otros medicamentos lo mismo. Continue your medications the same other than adding Tramadol  Regrese en 3 meses.  Please return to see Dr Sheffield Slider in 3 months. (error on print out, said 6 months on English version

## 2011-09-22 NOTE — Assessment & Plan Note (Signed)
Continues on occasion. To use Miralax

## 2012-01-01 ENCOUNTER — Encounter: Payer: Self-pay | Admitting: Family Medicine

## 2012-01-01 ENCOUNTER — Ambulatory Visit (INDEPENDENT_AMBULATORY_CARE_PROVIDER_SITE_OTHER): Payer: Medicare Other | Admitting: Family Medicine

## 2012-01-01 VITALS — BP 148/77 | HR 67 | Temp 98.5°F | Ht 63.0 in | Wt 138.2 lb

## 2012-01-01 DIAGNOSIS — H6120 Impacted cerumen, unspecified ear: Secondary | ICD-10-CM | POA: Diagnosis not present

## 2012-01-01 DIAGNOSIS — J988 Other specified respiratory disorders: Secondary | ICD-10-CM | POA: Diagnosis not present

## 2012-01-01 DIAGNOSIS — H6122 Impacted cerumen, left ear: Secondary | ICD-10-CM | POA: Insufficient documentation

## 2012-01-01 MED ORDER — SALINE SPRAY 0.65 % NA SOLN: 1.0000 | NASAL | Status: DC | PRN

## 2012-01-01 NOTE — Assessment & Plan Note (Signed)
A: patient hard of hearing. Hearing aid in R ear. No cerumen in R ear. Cerumen in L ear. P: irrigate L ear today.

## 2012-01-01 NOTE — Assessment & Plan Note (Signed)
A: upper airway congestion. Non infectious etiology.  P:  reassurance.  -nasal saline liberal use.

## 2012-01-01 NOTE — Patient Instructions (Addendum)
Gracias por venido hoy. Por congestion de nares use nasal saline.  Dr. Armen Pickup

## 2012-01-01 NOTE — Progress Notes (Signed)
Subjective:     Patient ID: Allen [name], male   DOB: 06-Feb-1919, 78 y.o.   MRN: [id number]  HPI 78 yo spanish speaking presents for same day visit with a complaint of loud breath sounds. History obtained from patient and his daughter with assistance of spanish interpreter.  Patient's daughter reports chronic loud breath sounds in the morning and early afternoon. Patient's daughter became concerned when sound was very loud 2 days ago. They deny associated cough, fever, runny nose, chest pain, shortness of breath, and difficulty swallowing.  He is not taking his Claritin.   Med Hx: significant for GERD, former smoker  Review of Systems As per HPI     Objective:   Physical Exam BP 148/77  Pulse 67  Temp 98.5 F (36.9 C) (Oral)  Ht 5\' 3"  (1.6 m)  Wt 138 lb 3 oz (62.681 kg)  BMI 24.48 kg/m2  SpO2 95% General appearance: alert, cooperative and no distress Head: Normocephalic, without obvious abnormality, atraumatic Eyes: conjunctivae/corneas clear. PERRL, EOM's intact.  Ears: normal TM and external ear canal right ear and abnormal external canal right ear - cerumen removed by irrigation and normal TM noted after wax removal Nose: Nares normal. Septum midline. Mucosa normal. No drainage or sinus tenderness. Throat: lips, mucosa, and tongue normal; teeth and gums normal Neck: no adenopathy, no carotid bruit, no JVD, supple, symmetrical, trachea midline and thyroid not enlarged, symmetric, no tenderness/mass/nodules Lungs: clear to auscultation bilaterally Heart: regular rate and rhythm, S1, S2 normal, no murmur, click, rub or gallop Skin: Skin color, texture, turgor normal. No rashes or lesions. Multiples benign appearing nevi.   Assessment and Plan:

## 2012-01-18 ENCOUNTER — Ambulatory Visit (INDEPENDENT_AMBULATORY_CARE_PROVIDER_SITE_OTHER): Payer: Medicare Other | Admitting: Family Medicine

## 2012-01-18 ENCOUNTER — Encounter: Payer: Self-pay | Admitting: Family Medicine

## 2012-01-18 VITALS — BP 147/93 | HR 76 | Temp 98.5°F | Ht 63.0 in | Wt 142.0 lb

## 2012-01-18 DIAGNOSIS — R7989 Other specified abnormal findings of blood chemistry: Secondary | ICD-10-CM | POA: Diagnosis not present

## 2012-01-18 DIAGNOSIS — K5900 Constipation, unspecified: Secondary | ICD-10-CM | POA: Diagnosis not present

## 2012-01-18 DIAGNOSIS — R112 Nausea with vomiting, unspecified: Secondary | ICD-10-CM | POA: Diagnosis not present

## 2012-01-18 DIAGNOSIS — D649 Anemia, unspecified: Secondary | ICD-10-CM | POA: Diagnosis not present

## 2012-01-18 DIAGNOSIS — R42 Dizziness and giddiness: Secondary | ICD-10-CM

## 2012-01-18 LAB — POCT URINALYSIS DIPSTICK
Blood, UA: NEGATIVE
Ketones, UA: NEGATIVE
Protein, UA: NEGATIVE
Spec Grav, UA: <=1.005
pH, UA: 6

## 2012-01-18 LAB — CBC
MCH: 32.9 pg (ref 26.0–34.0)
MCHC: 35.5 g/dL (ref 30.0–36.0)
Platelets: 286 10*3/uL (ref 150–400)
RBC: 4.25 MIL/uL (ref 4.22–5.81)

## 2012-01-18 NOTE — Progress Notes (Signed)
Interpreter Leeyah Heather Namih[facility] for [facility] 

## 2012-01-18 NOTE — Patient Instructions (Signed)
Puede tomar Ondansetron para nausea  Voy a llamarle con los [employer] pruebas de [place].

## 2012-01-19 ENCOUNTER — Telehealth: Payer: Self-pay | Admitting: Family Medicine

## 2012-01-19 LAB — COMPREHENSIVE METABOLIC PANEL
ALT: 16 U/L (ref 0–53)
AST: 20 U/L (ref 0–37)
Alkaline Phosphatase: 76 U/L (ref 39–117)
Sodium: 130 mEq/L — ABNORMAL LOW (ref 135–145)
Total Bilirubin: 0.3 mg/dL (ref 0.3–1.2)
Total Protein: 7.6 g/dL (ref 6.0–8.3)

## 2012-01-19 LAB — TSH: TSH: 5.836 u[IU]/mL — ABNORMAL HIGH (ref 0.350–4.500)

## 2012-01-19 NOTE — Telephone Encounter (Signed)
Called pt's son. Labs were WNL. Lorenda Hatchet, Renato Battles

## 2012-01-19 NOTE — Telephone Encounter (Signed)
Son is calling to get the results from labs from yesterday.

## 2012-01-19 NOTE — Progress Notes (Signed)
  Subjective:    Patient ID: Allen [name], male    DOB: 1919-02-21, 78 y.o.   MRN: [id number]  HPI Nausea with a couple vomiting episodes past 8 days. No abdominal pain. Passing hard stools without signs of blood. No blood in emesis Dizzy - tends to fall backward. No falls. right sciatica - continues the same.    Review of Systems  Constitutional: Positive for appetite change. Negative for fever, chills and unexpected weight change.  Respiratory: Negative for cough and shortness of breath.   Gastrointestinal: Positive for nausea, vomiting and constipation. Negative for abdominal pain, diarrhea and blood in stool.  Genitourinary: Positive for dysuria. Negative for frequency, flank pain and difficulty urinating.  Musculoskeletal: Positive for back pain and arthralgias.  Neurological: Positive for dizziness and weakness.  Psychiatric/Behavioral: Negative for confusion.       Objective:   Physical Exam  Constitutional: He appears well-developed and well-nourished. No distress.  HENT:  Right Ear: External ear normal.  Left Ear: External ear normal.       Hearing aide removed from left canal. Poor hearing even with it.   Neck: Normal range of motion.  Cardiovascular: Normal rate and regular rhythm.   No murmur heard. Pulmonary/Chest: Effort normal and breath sounds normal.  Abdominal: Soft. Bowel sounds are normal. He exhibits no distension and no mass. There is no tenderness. There is no rebound and no guarding.  Lymphadenopathy:    He has no cervical adenopathy.  Neurological: He is alert.  Skin: Skin is warm and dry. He is not diaphoretic.  Psychiatric: His behavior is normal. Judgment and thought content normal.       Mildly dysphoric appearing          Assessment & Plan:

## 2012-01-20 ENCOUNTER — Telehealth: Payer: Self-pay | Admitting: Family Medicine

## 2012-01-20 NOTE — Telephone Encounter (Addendum)
Consulted with Dr. Sheffield Slider and he will call patient and son this afternoon. Call back # [phone] Son notified.

## 2012-01-20 NOTE — Telephone Encounter (Signed)
Son is calling concerned about his dads BP.  Yesterday it was 149/90.  He has had some dizziness with this.

## 2012-01-21 ENCOUNTER — Telehealth: Payer: Self-pay | Admitting: Family Medicine

## 2012-01-21 ENCOUNTER — Encounter: Payer: Self-pay | Admitting: Family Medicine

## 2012-01-21 NOTE — Telephone Encounter (Signed)
Daughter was in the office today. She reports that her father, Allen [name], is doing better today.

## 2012-01-21 NOTE — Telephone Encounter (Signed)
I discussed his lab results with his daughter, Cristela Felt, who says that he is doing better today. Since he hasn't been losing weight and his labs are normal except for a mildly elevated TSH and Na of 130, I just asked her to have him follow up with me in a couple weeks. The mild blood pressure elevation is not concerning.

## 2012-01-23 ENCOUNTER — Emergency Department (HOSPITAL_COMMUNITY)
Admission: EM | Admit: 2012-01-23 | Discharge: 2012-01-23 | Disposition: A | Discharge status: Home / Self Care | Payer: Medicare Other | Attending: Emergency Medicine | Admitting: Emergency Medicine

## 2012-01-23 ENCOUNTER — Encounter (HOSPITAL_COMMUNITY): Payer: Self-pay | Admitting: *Deleted

## 2012-01-23 ENCOUNTER — Emergency Department (INDEPENDENT_AMBULATORY_CARE_PROVIDER_SITE_OTHER): Payer: Medicare Other

## 2012-01-23 ENCOUNTER — Emergency Department (INDEPENDENT_AMBULATORY_CARE_PROVIDER_SITE_OTHER)
Admission: EM | Admit: 2012-01-23 | Discharge: 2012-01-23 | Disposition: A | Discharge status: Nursing Home (Includes ICF) | Payer: Medicare Other | Source: Home / Self Care | Attending: Emergency Medicine | Admitting: Emergency Medicine

## 2012-01-23 DIAGNOSIS — R110 Nausea: Secondary | ICD-10-CM

## 2012-01-23 DIAGNOSIS — R630 Anorexia: Secondary | ICD-10-CM

## 2012-01-23 DIAGNOSIS — R42 Dizziness and giddiness: Secondary | ICD-10-CM | POA: Diagnosis not present

## 2012-01-23 DIAGNOSIS — R911 Solitary pulmonary nodule: Secondary | ICD-10-CM | POA: Diagnosis not present

## 2012-01-23 DIAGNOSIS — R112 Nausea with vomiting, unspecified: Secondary | ICD-10-CM

## 2012-01-23 DIAGNOSIS — Z950 Presence of cardiac pacemaker: Secondary | ICD-10-CM | POA: Diagnosis not present

## 2012-01-23 DIAGNOSIS — K5900 Constipation, unspecified: Secondary | ICD-10-CM | POA: Diagnosis not present

## 2012-01-23 DIAGNOSIS — Z87891 Personal history of nicotine dependence: Secondary | ICD-10-CM | POA: Insufficient documentation

## 2012-01-23 LAB — CBC WITH DIFFERENTIAL/PLATELET
Basophils Absolute: 0 10*3/uL (ref 0.0–0.1)
HCT: 40.8 % (ref 39.0–52.0)
Hemoglobin: 14.7 g/dL (ref 13.0–17.0)
Lymphocytes Relative: 24 % (ref 12–46)
Monocytes Absolute: 0.5 10*3/uL (ref 0.1–1.0)
Monocytes Relative: 8 % (ref 3–12)
Neutro Abs: 3.9 10*3/uL (ref 1.7–7.7)
RBC: 4.46 MIL/uL (ref 4.22–5.81)
RDW: 12.1 % (ref 11.5–15.5)
WBC: 6.4 10*3/uL (ref 4.0–10.5)

## 2012-01-23 LAB — POCT I-STAT, CHEM 8
BUN: 13 mg/dL (ref 6–23)
Calcium, Ion: 1.21 mmol/L (ref 1.13–1.30)
Chloride: 95 mEq/L — ABNORMAL LOW (ref 96–112)
Creatinine, Ser: 1.3 mg/dL (ref 0.50–1.35)
Glucose, Bld: 99 mg/dL (ref 70–99)

## 2012-01-23 LAB — POCT URINALYSIS DIP (DEVICE)
Bilirubin Urine: NEGATIVE
Glucose, UA: NEGATIVE mg/dL
Hgb urine dipstick: NEGATIVE
Ketones, ur: NEGATIVE mg/dL
Leukocytes, UA: NEGATIVE
Nitrite: NEGATIVE
Protein, ur: NEGATIVE mg/dL
Specific Gravity, Urine: 1.015 (ref 1.005–1.030)
Urobilinogen, UA: 0.2 mg/dL (ref 0.0–1.0)
pH: 7.5 (ref 5.0–8.0)

## 2012-01-23 MED ORDER — ONDANSETRON HCL 4 MG PO TABS: 8.0000 mg | ORAL_TABLET | Freq: Two times a day (BID) | ORAL | Status: DC | PRN

## 2012-01-23 MED ORDER — MIRTAZAPINE 7.5 MG PO TABS: 7.5000 mg | ORAL_TABLET | Freq: Every day | ORAL | Status: DC

## 2012-01-23 NOTE — ED Notes (Signed)
Pt sent here from [facility] for two week hx of nausea, dizziness, not eating. Vomited x 2, no abd pain, cp or sob. Had xray done at [facility], had new nodule right chest, sent here for further eval. No acute distress noted at triage.

## 2012-01-23 NOTE — ED Notes (Signed)
Spoke with Dr Madolyn Frieze, pt is okay for discharge, and paperwork is done, needs to be printed and change dispo to d/c

## 2012-01-23 NOTE — ED Provider Notes (Signed)
Chief Complaint  Patient presents with  . Nausea    History of Present Illness:   The patient is a 78 year old male with a two-week history of nausea and anorexia. He has vomited a couple of times, but not every day. He's felt dizzy and lightheaded. He denies any fever, chills, headache, vision changes, shortness of breath, wheezing, chest pain, palpitations, syncope, abdominal pain, or blood in his emesis. His bowel movements have been a little bit hard but there's been no blood in the stool. He has had urinary little bit more often than usual. He denies any blood in the urine. His wife thinks his ankles look a little bit swollen, but I cannot see any swelling today. He has had a slight dry cough. He saw his primary care physician, Dr. Sheffield Slider about 5 days ago and had a complete lab workup which looked fairly good. He was given Zofran but this didn't do much good. His family brings him back in today. He doesn't speak English well and history was obtained with the help of a family interpreter.  Review of Systems:  Other than noted above, the patient denies any of the following symptoms. Systemic:  No fever, chills, sweats, fatigue, myalgias, headache, or anorexia. Eye:  No redness, pain or drainage. ENT:  No earache, nasal congestion, rhinorrhea, sinus pressure, or sore throat. Lungs:  No cough, sputum production, wheezing, shortness of breath.  Cardiovascular:  No chest pain, palpitations, or syncope. GI:  No nausea, vomiting, abdominal pain or diarrhea. GU:  No dysuria, frequency, or hematuria. Skin:  No rash or pruritis.  PMFSH:  Past medical history, family history, social history, meds, and allergies were reviewed.   Physical Exam:   Vital signs:  BP 169/95  Pulse 72  Temp 98.3 F (36.8 C) (Oral)  Resp 18  SpO2 94% Filed Vitals:   01/23/12 1610 Supine  01/23/12 1612 Sitting  01/23/12 1613 Standing   BP: 171/88 171/96 169/95  Pulse: 64 65 72  Temp: 98.3 F (36.8 C)    TempSrc:  Oral    Resp: 18    SpO2: 94%      General:  Alert, in no distress. Eye:  PERRL, full EOMs.  Lids and conjunctivas were normal. ENT:  TMs and canals were normal, without erythema or inflammation.  Nasal mucosa was clear and uncongested, without drainage.  Mucous membranes were moist.  Pharynx was clear, without exudate or drainage.  There were no oral ulcerations or lesions. Neck:  Supple, no adenopathy, tenderness or mass. Thyroid was normal. Lungs:  No respiratory distress.  Lungs were clear to auscultation, without wheezes, rales or rhonchi.  Breath sounds were clear and equal bilaterally. Heart:  Regular rhythm, without gallops, murmers or rubs. Abdomen:  Soft, flat, and non-tender to palpation.  No hepatosplenomagaly or mass. Neuro:Neurological examination: The patient is alert and oriented x3. Speech is clear, fluent, and appropriate. Cranial nerves are intact. There is no pronator drift and finger to nose was normal. Muscle strength, sensation, and DTRs are normal. Babinskis are downgoing. Station and gait were normal.  Skin:  Clear, warm, and dry, without rash or lesions.  Labs:   Results for orders placed during the hospital encounter of 01/23/12  POCT I-STAT, CHEM 8      Component Value Range   Sodium 135  135 - 145 mEq/L   Potassium 4.7  3.5 - 5.1 mEq/L   Chloride 95 (*) 96 - 112 mEq/L   BUN 13  6 - 23  mg/dL   Creatinine, Ser 1.61  0.50 - 1.35 mg/dL   Glucose, Bld 99  70 - 99 mg/dL   Calcium, Ion 0.96  0.45 - 1.30 mmol/L   TCO2 27  0 - 100 mmol/L   Hemoglobin 15.6  13.0 - 17.0 g/dL   HCT 40.9  81.1 - 91.4 %  POCT URINALYSIS DIP (DEVICE)      Component Value Range   Glucose, UA NEGATIVE  NEGATIVE mg/dL   Bilirubin Urine NEGATIVE  NEGATIVE   Ketones, ur NEGATIVE  NEGATIVE mg/dL   Specific Gravity, Urine 1.015  1.005 - 1.030   Hgb urine dipstick NEGATIVE  NEGATIVE   pH 7.5  5.0 - 8.0   Protein, ur NEGATIVE  NEGATIVE mg/dL   Urobilinogen, UA 0.2  0.0 - 1.0 mg/dL    Nitrite NEGATIVE  NEGATIVE   Leukocytes, UA NEGATIVE  NEGATIVE     Radiology:  Dg Abd Acute W/chest  01/23/2012  *RADIOLOGY REPORT*  Clinical Data: Constipation for 2 weeks with nausea.  Dizziness.  ACUTE ABDOMEN SERIES (ABDOMEN 2 VIEW & CHEST 1 VIEW)  Comparison: 10/04/2003  Findings: Frontal view of the chest demonstrates dual lead pacer with leads right atrium right ventricle. Midline trachea.  Normal heart size and mediastinal contours for age.  No pleural effusion or pneumothorax.  There is improvement in bibasilar atelectasis/scarring.  Abominal films demonstrate no free intraperitoneal air on upright positioning.  No significant air fluid levels.  S-shaped thoracolumbar spine curvature.  No significant bowel dilatation on supine imaging.  Distal gas. Favor vascular or soft tissue calcifications projecting over the upper sacrum.  IMPRESSION:  1.  No acute findings in the abdomen. 2.  Improved bibasilar aeration with a 8 mm nodular density projecting over the right lung base.  Possibly calcified granuloma. Given patient age, a prudent clinical strategy could include follow- up plain films at 6 months to confirm stability of this appearance.  Original Report Authenticated By: Consuello Bossier, M.D.      Date: 01/23/2012  Rate: 72  Rhythm: Dual-chamber pacemaker  QRS Axis: left  Intervals: Pacemaker  ST/T Wave abnormalities: normal  Conduction Disutrbances:Pacemaker  Narrative Interpretation: AV dual-chamber paced rhythm, otherwise normal EKG.  Old EKG Reviewed: none available  Assessment:  The primary encounter diagnosis was Nausea. Diagnoses of Anorexia, Dizziness, and Pulmonary nodule were also pertinent to this visit. Differential diagnosis includes: Cancer, occult infection, cardiovascular, or medication side effect. I think he needs further evaluation at this time since he's not getting better at home. We'll send to the ED for further workup.  Plan:   1.  The following meds were  prescribed:   New Prescriptions   No medications on file   2.  The patient was transported to the emergency department via shuttle.  Reuben Likes, MD 01/23/12 [phone]

## 2012-01-23 NOTE — H&P (Signed)
Family Medicine Teaching [facility] Admission History and Physical Service Pager: [phone]  Patient name: Allen [name] Medical record number: [id number] Date of birth: October 03, 1918 Age: 78 y.o. Gender: male  Primary Care Provider: Tobin Chad, MD  Chief Complaint: nausea and vomiting  Assessment and Plan: Allen [name] is a 78 y.o. year old male presenting with nausea and weakness. Patient appears well-hydrated on exam and is able to tolerate liquid consumption. Likely has some baseline dementia given his age. At this time, we do not feel that patient requires hospitalization, which could just subject him to infections risk.   History of Present Illness: Allen [name] is a 78 y.o. year old male presenting with nausea and weakness. Was seen in clinic on August 12 by PCP Dr. Sheffield Slider, who prescribed him Zofran for the nausea. Pt states that this has not helped.  No vomiting, diarrhea, or abdominal pain. Last bowel movement was earlier today (it was small and hard). Last time he ate something was this morning (half of a pancake). Has very decreased appetite for the last two weeks. Does not have pain with bowel movement. Normally has BM about 2-3 times per day. Does not feel nauseous right now.  No SOB, and nothing else feels different or wrong. Does have some burning with urination, which has also been going on for two weeks. No headache, but has felt lightheaded. Has been drinking 3-4 bottles of water per day. No chest pain or palpitations. Able to walk at baseline, but is kind of wobbly. Does not use cane or walker.  Son thinks he seems more confused than normal. Son thinks he might be depressed. Pt states he wakes up a lot at night, and that it is hard to fall asleep. No recent stresses or changes in his life.  Patient Active Problem List  Diagnosis  . HYPERLIPIDEMIA  . RESTLESS LEGS SYNDROME  . LOSS, HEARING NOS  . HYPERTENSION, BENIGN  .  SECOND DEGREE ATRIOVENTRICULAR HEART BLOCK  . ALLERGIC RHINITIS, SEASONAL  . GASTROESOPHAGEAL REFLUX, NO ESOPHAGITIS  . CONSTIPATION  . BENIGN PROSTATIC HYPERTROPHY, WITH OBSTRUCTION  . Lumbar radiculopathy, chronic  . TSH elevation  . Anemia  . Congestion of upper airway  . Impacted cerumen of left ear  . Dizziness  . Nausea & vomiting   Past Medical History: History reviewed. No pertinent past medical history. Past Surgical History: Past Surgical History  Procedure Date  . Testicle surgery 1933  . Inguinal hernia repair 1980  . Pacemaker placement 03/2009    syncope - bradycardia, and 2nd degree block    Social History: History  Substance Use Topics  . Smoking status: Former Games developer  . Smokeless tobacco: Not on file  . Alcohol Use: No  He lives with his son and daughter-in-law in [place] for the past year.  For any additional social history documentation, please refer to relevant sections of EMR.  Family History: History reviewed. No pertinent family history.  Allergies: No Known Allergies No current facility-administered medications on file prior to encounter.   Current Outpatient Prescriptions on File Prior to Encounter  Medication Sig Dispense Refill  . acetaminophen (TYLENOL) 650 MG CR tablet Take 650 mg by mouth every 8 (eight) hours as needed.      Marland Kitchen amLODipine (NORVASC) 5 MG tablet Take 1 tablet (5 mg total) by mouth daily. For high blood pressure. Label in Spanish.  90 tablet  3  . aspirin EC 81 MG tablet Take 81 mg by mouth daily.        Marland Kitchen  Fish Oil OIL Take 1 capsule by mouth 3 (three) times daily.        Marland Kitchen gabapentin (NEURONTIN) 600 MG tablet Take 300mg  (1/2 tablet) TID and 600mg  (1 tablet) at bedtime.  225 tablet  3  . loratadine (CLARITIN) 10 MG tablet Take 1 tablet (10 mg total) by mouth daily as needed for allergies. One daily as needed    90 tablet  3  . omeprazole (PRILOSEC) 20 MG capsule Take 2 capsules (40 mg total) by mouth 2 (two) times daily.   180 capsule  3  . ondansetron (ZOFRAN) 4 MG tablet Take 4 mg by mouth every 8 (eight) hours as needed.      . pravastatin (PRAVACHOL) 10 MG tablet Take 1 tablet (10 mg total) by mouth daily.  90 tablet  3  . sodium chloride (OCEAN) 0.65 % SOLN nasal spray Place 1 spray into the nose as needed for congestion.  45 mL  0  . traMADol (ULTRAM) 50 MG tablet Take 1 tablet (50 mg total) by mouth every 8 (eight) hours as needed.  90 tablet  11  . zoster vaccine live, PF, (ZOSTAVAX) 65784 UNT/0.65ML injection Inject 19,400 Units into the skin once.  1 vial  0   Review Of Systems: Per HPI .  Physical Exam: BP 168/79  Pulse 60  Temp 98.1 F (36.7 C) (Oral)  Resp 20  SpO2 95% Exam: (as performed by Dr. Madolyn Frieze) General: NAD, lying in bed, Oriented to person and place (type of building), unable to name city or year. HEENT: TM clear bilaterally, moist mucous membranes, no cervical LAD Cardiovascular: RRR, no murmurs auscultated Respiratory: crackles L lung field, right lung clear to auscultation Abdomen: nontender to palpation Extremities: lower extremities nontender Skin: good skin turgor GU: normal rectal tone, no stool palpable in the rectal vault Neuro: EOMI, PERRL, gait is relatively slow and careful, but steady Rectum: good sphincter tone, no stool palpated in rectal vault   Labs and Imaging: CBC BMET   Lab 01/23/12 1824  WBC 6.4  HGB 14.7  HCT 40.8  PLT 240    Lab 01/23/12 1640 01/18/12 1655  NA 135 --  K 4.7 --  CL 95* --  CO2 -- 29  BUN 13 --  CREATININE 1.30 --  GLUCOSE 99 --  CALCIUM -- 9.3    TSH 08/12: 5.836  CXR:  1. No acute findings in the abdomen.  2. Improved bibasilar aeration with a 8 mm nodular density projecting over the right lung base. Possibly calcified granuloma. Given patient age, a prudent clinical strategy could include follow-up plain films at 6 months to confirm stability of this appearance.  Assessment and plan: This is a 78 year old male from  [place] who is living with his son and daughter-in-law in [place] who presents with nausea without vomiting/decreased appetite/lightheadedness for the past 2 weeks.   His past medical history is significant for hypertension (well-controlled), GERD, and BPH.  # Nausea without vomiting, decreased appetite, lightheadedness for the past 2 weeks.  Family seems concerned about his decline over the past couple of weeks. Work-up on 08/12 (when he last saw his PCP) and today is not indicative of infection, dehydration, or other acute abnormality. His CXR shows a small 8 mm nodule in his right lower lobe; however, he does not present with any respiratory symptoms and his vital signs are stable. -We will discharge patient from the ED and advised family to call on Monday to arrange close follow-up with  PCP.  -For his appetite. Concern for depression contributing. Will start mirtazepine 7.5 mg qhs.  -His TSH was elevated on 08/12. He has had elevated TSH but normal subsequent free T4 in the past. Will draw lab for free T4 to be followed-up by PCP. -Advised to increase dose of Zofran at a time to 8 mg and take every 12 hours to see if this helps with nausea.  -His albumin a few days ago was WNL. Will check pre-albumin before discharge and advise PCP to follow-up.   # 8 mm opacity found incidentally on CXR/AXR. Will defer to PCP regarding management since he does not present with respiratory symptoms.  -Consider repeating CXR in 6 months if family would like to pursue aggressive management of potential malignancy with patient's age  -Consider checking PPD since he is a recent immigrant from [place]   # History of hypertension. Well controlled on Norvasc.   # Dysuria. His urinalysis was not significant for infection. We suspect dysuria may be due to BPH.   Disposition: the patient was discharged to home with family in stable medical condition. The son feels comfortable taking care of the patient at home and is  not interested in placement.   Discharge medications: Home medications: The patient was asked to resume all of his home medications, except his Zofran was changed from 4 mg q 8 hours to 8 mg q 12 hours. New medication:  Mirtazepine 7.5 mg po qhs   Follow-up issues: # Follow-up labs: free T4, prealbumin, hemoccult   # Consider increasing mirtazepine to help with appetite and depression   Levert Feinstein, MD 01/23/2012, 8:04 PM  Priscella Mann MD 08/17/203  Family Medicine Teaching Service

## 2012-01-23 NOTE — ED Notes (Signed)
Translated by pt daughter. Pt co nausea x 2 wks with dizziness.  States he has decreased appetitie and "feels full".  Treated by pcp with zofran with little relief.  No active vomiting.  States bms have been more hard in nature.

## 2012-01-23 NOTE — ED Provider Notes (Signed)
History     CSN: [id number]  Arrival date & time 01/23/12  1749   First MD Initiated Contact with Patient 01/23/12 1807      Chief Complaint  Patient presents with  . Nausea  . Dizziness     HPI 78 year old brought in by family because of anorexia and decreased appetite.  Has had nausea with a couple of episodes of vomiting.  No diarrhea.  Has been seen by his primary care doctor for the same in the past few weeks.  Was given Zofran but has not offered any relief.  Denies fever chills.  Denies abdominal distention. History reviewed. No pertinent past medical history.  Past Surgical History  Procedure Date  . Testicle surgery 1933  . Inguinal hernia repair 1980  . Pacemaker placement 03/2009    syncope - bradycardia, and 2nd degree block     History reviewed. No pertinent family history.  History  Substance Use Topics  . Smoking status: Former Games developer  . Smokeless tobacco: Not on file  . Alcohol Use: No      Review of Systems  All other systems reviewed and are negative.    Allergies  Review of patient's allergies indicates no known allergies.  Home Medications   Current Outpatient Rx  Name Route Sig Dispense Refill  . ACETAMINOPHEN ER 650 MG PO TBCR Oral Take 650 mg by mouth every 8 (eight) hours as needed.    Marland Kitchen AMLODIPINE BESYLATE 5 MG PO TABS Oral Take 1 tablet (5 mg total) by mouth daily. For high blood pressure. Label in Spanish. 90 tablet 3  . ASPIRIN EC 81 MG PO TBEC Oral Take 81 mg by mouth daily.      Marland Kitchen FISH OIL OIL Oral Take 1 capsule by mouth 3 (three) times daily.      Marland Kitchen GABAPENTIN 600 MG PO TABS  Take 300mg  (1/2 tablet) TID and 600mg  (1 tablet) at bedtime. 225 tablet 3  . LORATADINE 10 MG PO TABS Oral Take 1 tablet (10 mg total) by mouth daily as needed for allergies. One daily as needed   90 tablet 3  . MIRTAZAPINE 7.5 MG PO TABS Oral Take 1 tablet (7.5 mg total) by mouth at bedtime. 30 tablet 0  . OMEPRAZOLE 20 MG PO CPDR Oral Take 2 capsules  (40 mg total) by mouth 2 (two) times daily. 180 capsule 3  . ONDANSETRON HCL 4 MG PO TABS Oral Take 2 tablets (8 mg total) by mouth every 12 (twelve) hours as needed. 20 tablet 0  . PRAVASTATIN SODIUM 10 MG PO TABS Oral Take 1 tablet (10 mg total) by mouth daily. 90 tablet 3  . SALINE 0.65 % NA SOLN Nasal Place 1 spray into the nose as needed for congestion. 45 mL 0  . TRAMADOL HCL 50 MG PO TABS Oral Take 1 tablet (50 mg total) by mouth every 8 (eight) hours as needed. 90 tablet 11  . ZOSTER VACCINE LIVE 19400 UNT/0.65ML [place] SOLR Subcutaneous Inject 19,400 Units into the skin once. 1 vial 0    BP 127/78  Pulse 72  Temp 98.1 F (36.7 C) (Oral)  Resp 12  SpO2 94%  Physical Exam General: Alert, in no distress.  Eye: PERRL, full EOMs. Lids and conjunctivas were normal.  ENT: TMs and canals were normal, without erythema or inflammation. Nasal mucosa was clear and uncongested, without drainage. Mucous membranes were moist. Pharynx was clear, without exudate or drainage. There were no oral ulcerations or lesions.  Neck: Supple, no adenopathy, tenderness or mass. Thyroid was normal.  Lungs: No respiratory distress. Lungs were clear to auscultation, without wheezes, rales or rhonchi. Breath sounds were clear and equal bilaterally.  Heart: Regular rhythm, without gallops, murmers or rubs.  Abdomen: Soft, flat, and non-tender to palpation. No hepatosplenomagaly or mass.  Neuro:Neurological examination: The patient is alert and oriented x3. Speech is clear, fluent, and appropriate. Cranial nerves are intact. There is no pronator drift and finger to nose was normal. Muscle strength, sensation, and DTRs are normal. Babinskis are downgoing. Station and gait were normal.  Skin: Clear, warm, and dry, without rash or lesions  ED Course  Procedures (including critical care time)  Labs Reviewed  CBC WITH DIFFERENTIAL - Abnormal; Notable for the following:    Eosinophils Relative 6 (*)     All other  components within normal limits  OCCULT BLOOD X 1 CARD TO LAB, STOOL  T4, FREE  PREALBUMIN  URINE CULTURE   Dg Abd Acute W/chest  01/23/2012  *RADIOLOGY REPORT*  Clinical Data: Constipation for 2 weeks with nausea.  Dizziness.  ACUTE ABDOMEN SERIES (ABDOMEN 2 VIEW & CHEST 1 VIEW)  Comparison: 10/04/2003  Findings: Frontal view of the chest demonstrates dual lead pacer with leads right atrium right ventricle. Midline trachea.  Normal heart size and mediastinal contours for age.  No pleural effusion or pneumothorax.  There is improvement in bibasilar atelectasis/scarring.  Abominal films demonstrate no free intraperitoneal air on upright positioning.  No significant air fluid levels.  S-shaped thoracolumbar spine curvature.  No significant bowel dilatation on supine imaging.  Distal gas. Favor vascular or soft tissue calcifications projecting over the upper sacrum.  IMPRESSION:  1.  No acute findings in the abdomen. 2.  Improved bibasilar aeration with a 8 mm nodular density projecting over the right lung base.  Possibly calcified granuloma. Given patient age, a prudent clinical strategy could include follow- up plain films at 6 months to confirm stability of this appearance.  Original Report Authenticated By: Consuello Bossier, M.D.     1. Dizziness   2. Nausea & vomiting       MDM  Family practice was consulted for possible admission.        Nelia Shi, MD 01/23/12 2207

## 2012-01-24 NOTE — H&P (Signed)
Family Medicine Teaching Service Attending Note  I discussed patient Allen [name]  with Dr. Madolyn Frieze and reviewed their note for today.  I agree with their assessment and plan.

## 2012-01-25 ENCOUNTER — Ambulatory Visit (INDEPENDENT_AMBULATORY_CARE_PROVIDER_SITE_OTHER): Payer: Medicare Other | Admitting: Family Medicine

## 2012-01-25 ENCOUNTER — Encounter: Payer: Self-pay | Admitting: Family Medicine

## 2012-01-25 VITALS — BP 128/73 | HR 82 | Temp 98.9°F | Ht 63.0 in | Wt 137.7 lb

## 2012-01-25 DIAGNOSIS — N401 Enlarged prostate with lower urinary tract symptoms: Secondary | ICD-10-CM | POA: Diagnosis not present

## 2012-01-25 DIAGNOSIS — R339 Retention of urine, unspecified: Secondary | ICD-10-CM | POA: Diagnosis not present

## 2012-01-25 DIAGNOSIS — N3289 Other specified disorders of bladder: Secondary | ICD-10-CM | POA: Diagnosis not present

## 2012-01-25 DIAGNOSIS — R112 Nausea with vomiting, unspecified: Secondary | ICD-10-CM | POA: Diagnosis not present

## 2012-01-25 LAB — COMPREHENSIVE METABOLIC PANEL
ALT: 14 U/L (ref 0–53)
CO2: 27 mEq/L (ref 19–32)
Sodium: 128 mEq/L — ABNORMAL LOW (ref 135–145)
Total Bilirubin: 0.4 mg/dL (ref 0.3–1.2)
Total Protein: 7.7 g/dL (ref 6.0–8.3)

## 2012-01-25 LAB — OCCULT BLOOD, POC DEVICE: Fecal Occult Bld: NEGATIVE

## 2012-01-25 LAB — POCT URINALYSIS DIPSTICK
Blood, UA: NEGATIVE
Ketones, UA: NEGATIVE
Leukocytes, UA: NEGATIVE
Protein, UA: NEGATIVE
Spec Grav, UA: 1.015
pH, UA: 7

## 2012-01-25 NOTE — Patient Instructions (Signed)
We will set up a CT scan for you along with an appointment to see a GI physician. We will get in touch with you in the next day or so about what the results of your CT scan are. If you start vomiting up blood or having blood in your stools, come back right away. I would suggest stopping your Zofran and see if this helps your urinary symptoms.

## 2012-01-25 NOTE — Assessment & Plan Note (Addendum)
I am not sure what the etiology of the patient's symptoms is.  He is not orthostatic today.  He does not look clinically dehydrated today.  As such, I do not see any indication for admission.  However, with his problems with subjective urinary retention, constipation, and persistent nausea that has lasted for 3 weeks I am at least somewhat concerned about the possibility of intraabdominal pathology.  I will obtain CT abd/pelvis with contrast and will also make referral to GI.  Family was satisfied with this plan.  I have also recommended stopping zofran to see if his urinary retention symptoms are related.  It was not helping anyway.  Case discussed in detail with Dr. Mauricio Po.

## 2012-01-25 NOTE — Assessment & Plan Note (Signed)
I wonder if the patient's symptoms of difficulty with urination may be related to zofran.  Will recommend stopping this med and close f/u

## 2012-01-25 NOTE — Progress Notes (Signed)
Patient ID: Allen [name], male   DOB: 1919-01-09, 78 y.o.   MRN: [id number] Subjective: The patient is a 78 y.o. year old male who presents today for nausea/vomiting.  1. N/V: Family reports lots of problems with taking food and then feeling nauseous.  Problems began 3 weeks ago.  Began with dizziness and nausea.  Patient currently feels only nausea and gets lightheaded when he goes from seated to standing.  Has vomited one time in the last 24 hours but has not been eating at all.  No pain.  Vomitus has been whatever he has just eaten.  Family reports that patient has the desire to vomit, urinate, and have bowel movement all at the same time but then does none of the above.  Patient is not really able to keep down any fluids.  Patient's past medical, social, and family history were reviewed and updated as appropriate. History  Substance Use Topics  . Smoking status: Former Games developer  . Smokeless tobacco: Not on file  . Alcohol Use: No   Objective:  Filed Vitals:   01/25/12 1342  BP: 155/55  Pulse: 73  Temp: 98.9 F (37.2 C)   Gen: NAD HEENT: MMM, pharynx non-erythematous CV: Regular, no significant murmurs.  Not tachycardic Resp: CTABL Abd: SNTND, no masses or organomegaly Ext: 2+ pulses, no skin tenting, good cap refill Neuro: Patient has some unsteadiness on his feet but has negative rhomberg and no nystagmas are elicited  Assessment/Plan:  Please also see individual problems in problem list for problem-specific plans.

## 2012-01-25 NOTE — Progress Notes (Signed)
Interpreter Allen [name] for Dr Ritch 

## 2012-01-26 ENCOUNTER — Encounter: Payer: Self-pay | Admitting: Internal Medicine

## 2012-01-26 ENCOUNTER — Ambulatory Visit (HOSPITAL_COMMUNITY)
Admission: RE | Admit: 2012-01-26 | Discharge: 2012-01-26 | Disposition: A | Discharge status: Home / Self Care | Payer: Medicare Other | Source: Ambulatory Visit | Attending: Family Medicine | Admitting: Family Medicine

## 2012-01-26 DIAGNOSIS — R339 Retention of urine, unspecified: Secondary | ICD-10-CM

## 2012-01-26 DIAGNOSIS — R911 Solitary pulmonary nodule: Secondary | ICD-10-CM | POA: Insufficient documentation

## 2012-01-26 DIAGNOSIS — K449 Diaphragmatic hernia without obstruction or gangrene: Secondary | ICD-10-CM | POA: Insufficient documentation

## 2012-01-26 DIAGNOSIS — K5730 Diverticulosis of large intestine without perforation or abscess without bleeding: Secondary | ICD-10-CM | POA: Insufficient documentation

## 2012-01-26 DIAGNOSIS — I719 Aortic aneurysm of unspecified site, without rupture: Secondary | ICD-10-CM | POA: Insufficient documentation

## 2012-01-26 DIAGNOSIS — N3289 Other specified disorders of bladder: Secondary | ICD-10-CM

## 2012-01-26 DIAGNOSIS — R110 Nausea: Secondary | ICD-10-CM | POA: Diagnosis not present

## 2012-01-26 MED ORDER — IOHEXOL 300 MG/ML  SOLN
80.0000 mL | Freq: Once | INTRAMUSCULAR | Status: AC | PRN
  Administered 2012-01-26: 80 mL via INTRAVENOUS

## 2012-01-27 ENCOUNTER — Telehealth: Payer: Self-pay | Admitting: Family Medicine

## 2012-01-27 NOTE — Telephone Encounter (Signed)
Will fwd. To Dr.Ritch (ordered) for review .Arlyss Repress

## 2012-01-27 NOTE — Telephone Encounter (Signed)
Informed family of results.  Patient still not able to eat drink.  I spoke with son who was just getting off from work and had not seen him all day.  Recommended that if he looks really bad to take him to the ED tonight.  Otherwise, got appointment with Dr. Jennette Kettle at 10am tomorrow to re-evaluate for dehydration.

## 2012-01-27 NOTE — Telephone Encounter (Signed)
error 

## 2012-01-27 NOTE — Telephone Encounter (Signed)
Son is calling for results of scan from yesterday on his father.

## 2012-01-28 ENCOUNTER — Observation Stay (HOSPITAL_COMMUNITY): Payer: Medicare Other

## 2012-01-28 ENCOUNTER — Encounter (HOSPITAL_COMMUNITY): Payer: Self-pay | Admitting: *Deleted

## 2012-01-28 ENCOUNTER — Inpatient Hospital Stay (HOSPITAL_COMMUNITY)
Admission: AD | Admit: 2012-01-28 | Discharge: 2012-01-30 | DRG: 392 | Disposition: A | Discharge status: Home / Self Care | Payer: Medicare Other | Source: Ambulatory Visit | Attending: Family Medicine | Admitting: Family Medicine

## 2012-01-28 ENCOUNTER — Encounter: Payer: Self-pay | Admitting: Family Medicine

## 2012-01-28 ENCOUNTER — Ambulatory Visit: Payer: Medicare Other | Admitting: Family Medicine

## 2012-01-28 VITALS — BP 150/78 | HR 74 | Temp 99.2°F | Wt 133.9 lb

## 2012-01-28 DIAGNOSIS — R634 Abnormal weight loss: Secondary | ICD-10-CM | POA: Diagnosis not present

## 2012-01-28 DIAGNOSIS — R911 Solitary pulmonary nodule: Secondary | ICD-10-CM | POA: Diagnosis present

## 2012-01-28 DIAGNOSIS — R112 Nausea with vomiting, unspecified: Secondary | ICD-10-CM | POA: Diagnosis present

## 2012-01-28 DIAGNOSIS — Z79899 Other long term (current) drug therapy: Secondary | ICD-10-CM

## 2012-01-28 DIAGNOSIS — N400 Enlarged prostate without lower urinary tract symptoms: Secondary | ICD-10-CM | POA: Diagnosis present

## 2012-01-28 DIAGNOSIS — Z950 Presence of cardiac pacemaker: Secondary | ICD-10-CM

## 2012-01-28 DIAGNOSIS — K219 Gastro-esophageal reflux disease without esophagitis: Secondary | ICD-10-CM | POA: Diagnosis present

## 2012-01-28 DIAGNOSIS — J189 Pneumonia, unspecified organism: Secondary | ICD-10-CM | POA: Diagnosis not present

## 2012-01-28 DIAGNOSIS — I441 Atrioventricular block, second degree: Secondary | ICD-10-CM | POA: Diagnosis present

## 2012-01-28 DIAGNOSIS — K449 Diaphragmatic hernia without obstruction or gangrene: Secondary | ICD-10-CM | POA: Diagnosis not present

## 2012-01-28 DIAGNOSIS — I10 Essential (primary) hypertension: Secondary | ICD-10-CM | POA: Diagnosis present

## 2012-01-28 DIAGNOSIS — G2581 Restless legs syndrome: Secondary | ICD-10-CM | POA: Diagnosis present

## 2012-01-28 DIAGNOSIS — R633 Feeding difficulties: Secondary | ICD-10-CM | POA: Diagnosis not present

## 2012-01-28 DIAGNOSIS — Z7982 Long term (current) use of aspirin: Secondary | ICD-10-CM | POA: Diagnosis not present

## 2012-01-28 DIAGNOSIS — E785 Hyperlipidemia, unspecified: Secondary | ICD-10-CM | POA: Diagnosis present

## 2012-01-28 DIAGNOSIS — I517 Cardiomegaly: Secondary | ICD-10-CM | POA: Diagnosis not present

## 2012-01-28 DIAGNOSIS — J438 Other emphysema: Secondary | ICD-10-CM

## 2012-01-28 DIAGNOSIS — R0602 Shortness of breath: Secondary | ICD-10-CM | POA: Diagnosis not present

## 2012-01-28 DIAGNOSIS — M5416 Radiculopathy, lumbar region: Secondary | ICD-10-CM

## 2012-01-28 HISTORY — DX: Gastro-esophageal reflux disease without esophagitis: K21.9

## 2012-01-28 HISTORY — DX: Essential (primary) hypertension: I10

## 2012-01-28 HISTORY — DX: Personal history of other diseases of the digestive system: Z87.19

## 2012-01-28 HISTORY — DX: Unspecified osteoarthritis, unspecified site: M19.90

## 2012-01-28 LAB — URINALYSIS, ROUTINE W REFLEX MICROSCOPIC
Bilirubin Urine: NEGATIVE
Ketones, ur: 15 mg/dL — AB
Nitrite: NEGATIVE
Specific Gravity, Urine: 1.017 (ref 1.005–1.030)
Urobilinogen, UA: 0.2 mg/dL (ref 0.0–1.0)

## 2012-01-28 LAB — CBC
HCT: 39.6 % (ref 39.0–52.0)
Hemoglobin: 14.8 g/dL (ref 13.0–17.0)
MCV: 89.6 fL (ref 78.0–100.0)
Platelets: 248 10*3/uL (ref 150–400)
RBC: 4.42 MIL/uL (ref 4.22–5.81)
WBC: 6.5 10*3/uL (ref 4.0–10.5)

## 2012-01-28 LAB — DIFFERENTIAL
Basophils Absolute: 0 10*3/uL (ref 0.0–0.1)
Basophils Relative: 0 % (ref 0–1)
Eosinophils Relative: 1 % (ref 0–5)
Monocytes Absolute: 0.5 10*3/uL (ref 0.1–1.0)
Neutro Abs: 4.1 10*3/uL (ref 1.7–7.7)

## 2012-01-28 LAB — COMPREHENSIVE METABOLIC PANEL
AST: 19 U/L (ref 0–37)
CO2: 25 mEq/L (ref 19–32)
Chloride: 93 mEq/L — ABNORMAL LOW (ref 96–112)
Creatinine, Ser: 1.02 mg/dL (ref 0.50–1.35)
GFR calc Af Amer: 72 mL/min — ABNORMAL LOW (ref >90)
GFR calc non Af Amer: 62 mL/min — ABNORMAL LOW (ref >90)
Glucose, Bld: 108 mg/dL — ABNORMAL HIGH (ref 70–99)
Total Bilirubin: 0.6 mg/dL (ref 0.3–1.2)

## 2012-01-28 MED ORDER — SODIUM CHLORIDE 0.9 % IV SOLN
INTRAVENOUS | Status: DC
  Administered 2012-01-29 (×2): via INTRAVENOUS

## 2012-01-28 MED ORDER — SODIUM CHLORIDE 0.9 % IV SOLN: INTRAVENOUS | Status: DC

## 2012-01-28 MED ORDER — GABAPENTIN 600 MG PO TABS
600.0000 mg | ORAL_TABLET | Freq: Three times a day (TID) | ORAL | Status: DC
  Administered 2012-01-28 – 2012-01-30 (×5): 600 mg via ORAL
  Filled 2012-01-28 (×8): qty 1

## 2012-01-28 MED ORDER — SODIUM CHLORIDE 0.9 % IJ SOLN: 3.0000 mL | Freq: Two times a day (BID) | INTRAMUSCULAR | Status: DC

## 2012-01-28 MED ORDER — AMLODIPINE BESYLATE 5 MG PO TABS
5.0000 mg | ORAL_TABLET | Freq: Every day | ORAL | Status: DC
  Administered 2012-01-28 – 2012-01-30 (×3): 5 mg via ORAL
  Filled 2012-01-28 (×4): qty 1

## 2012-01-28 MED ORDER — SALINE SPRAY 0.65 % NA SOLN
1.0000 | NASAL | Status: DC | PRN
  Filled 2012-01-28: qty 44

## 2012-01-28 MED ORDER — PANTOPRAZOLE SODIUM 40 MG PO TBEC
40.0000 mg | DELAYED_RELEASE_TABLET | Freq: Every day | ORAL | Status: DC
  Administered 2012-01-28 – 2012-01-30 (×3): 40 mg via ORAL
  Filled 2012-01-28 (×3): qty 1

## 2012-01-28 MED ORDER — ONDANSETRON HCL 4 MG/2ML IJ SOLN
4.0000 mg | Freq: Four times a day (QID) | INTRAMUSCULAR | Status: DC | PRN
  Administered 2012-01-28: 4 mg via INTRAVENOUS
  Filled 2012-01-28: qty 2

## 2012-01-28 MED ORDER — ONDANSETRON HCL 4 MG PO TABS
4.0000 mg | ORAL_TABLET | Freq: Four times a day (QID) | ORAL | Status: DC | PRN
  Filled 2012-01-28: qty 1

## 2012-01-28 MED ORDER — ONDANSETRON HCL 4 MG/2ML IJ SOLN: 4.0000 mg | Freq: Four times a day (QID) | INTRAMUSCULAR | Status: DC | PRN

## 2012-01-28 MED ORDER — DEXTROSE-NACL 5-0.9 % IV SOLN
INTRAVENOUS | Status: AC
  Administered 2012-01-28: 14:00:00 via INTRAVENOUS

## 2012-01-28 MED ORDER — TAMSULOSIN HCL 0.4 MG PO CAPS
0.4000 mg | ORAL_CAPSULE | Freq: Every day | ORAL | Status: DC
  Administered 2012-01-28 – 2012-01-29 (×2): 0.4 mg via ORAL
  Filled 2012-01-28 (×3): qty 1

## 2012-01-28 MED ORDER — HEPARIN SODIUM (PORCINE) 5000 UNIT/ML IJ SOLN
5000.0000 [IU] | Freq: Three times a day (TID) | INTRAMUSCULAR | Status: DC
  Administered 2012-01-28 – 2012-01-30 (×6): 5000 [IU] via SUBCUTANEOUS
  Filled 2012-01-28 (×9): qty 1

## 2012-01-28 MED ORDER — ASPIRIN EC 81 MG PO TBEC
81.0000 mg | DELAYED_RELEASE_TABLET | Freq: Every day | ORAL | Status: DC
  Administered 2012-01-28 – 2012-01-30 (×3): 81 mg via ORAL
  Filled 2012-01-28 (×4): qty 1

## 2012-01-28 MED ORDER — MIRTAZAPINE 7.5 MG PO TABS
7.5000 mg | ORAL_TABLET | Freq: Every day | ORAL | Status: DC
  Administered 2012-01-28 – 2012-01-29 (×2): 7.5 mg via ORAL
  Filled 2012-01-28 (×3): qty 1

## 2012-01-28 MED ORDER — SIMVASTATIN 5 MG PO TABS
5.0000 mg | ORAL_TABLET | Freq: Every day | ORAL | Status: DC
  Administered 2012-01-28 – 2012-01-29 (×2): 5 mg via ORAL
  Filled 2012-01-28 (×3): qty 1

## 2012-01-28 MED ORDER — DEXTROSE 5 % IV SOLN
500.0000 mg | INTRAVENOUS | Status: DC
  Administered 2012-01-28: 500 mg via INTRAVENOUS
  Filled 2012-01-28: qty 500

## 2012-01-28 MED ORDER — ASPIRIN EC 81 MG PO TBEC: 81.0000 mg | DELAYED_RELEASE_TABLET | Freq: Every day | ORAL | Status: DC

## 2012-01-28 MED ORDER — DEXTROSE 5 % IV SOLN
1.0000 g | INTRAVENOUS | Status: DC
  Administered 2012-01-28: 1 g via INTRAVENOUS
  Filled 2012-01-28: qty 10

## 2012-01-28 NOTE — Progress Notes (Signed)
FPTS INTERIM NOTE   Patient's UA and CXR negative for infection.  Will d/c antibiotics at this time.  D/w Attending physician Dr. Mauricio Po, who is in agreement.  Jospeh Mangel 01/28/2012, 3:59 PM

## 2012-01-28 NOTE — Consult Note (Signed)
[facility] Consult Note  Referring Provider: No ref. provider found Primary Care Physician:  Tobin Chad, MD Primary Gastroenterologist:  Dr.  Antony Contras Complaint: And a persistent vomiting HPI: Allen [name] is an 78 y.o. Hispanic male  who presents with worsening persistent nausea and vomiting over the last 3-4 weeks ago where he can barely keep down liquids. He was seen in the emergency room. 3 times and released. His relatives they has lost 5-10 pounds of weight. He was admitted today and had an abdominal CT scan that showed a hiatal hernia and a poorly distensible stomach that could not be well evaluated. He does not speak any [place]. His relatives say that he has not really been complaining of any abdominal pain hematemesis or diarrhea. He tolerated some liquid today.  History reviewed. No pertinent past medical history.  Past Surgical History  Procedure Date  . Testicle surgery 1933  . Inguinal hernia repair 1980  . Pacemaker placement 03/2009    syncope - bradycardia, and 2nd degree block     Medications Prior to Admission  Medication Sig Dispense Refill  . acetaminophen (TYLENOL) 650 MG CR tablet Take 650 mg by mouth every 8 (eight) hours as needed.      Marland Kitchen amLODipine (NORVASC) 5 MG tablet Take 1 tablet (5 mg total) by mouth daily. For high blood pressure. Label in Spanish.  90 tablet  3  . aspirin EC 81 MG tablet Take 81 mg by mouth daily.        . Fish Oil OIL Take 1 capsule by mouth 3 (three) times daily.       Marland Kitchen gabapentin (NEURONTIN) 600 MG tablet Take 300-600 mg by mouth 3 (three) times daily. Take 1/2 a tablet 3 times a day. Take 1 tablet at bedtime.      Marland Kitchen loratadine (CLARITIN) 10 MG tablet Take 1 tablet (10 mg total) by mouth daily as needed for allergies. One daily as needed    90 tablet  3  . mirtazapine (REMERON) 7.5 MG tablet Take 1 tablet (7.5 mg total) by mouth at bedtime.  30 tablet  0  . omeprazole (PRILOSEC) 20 MG capsule Take  2 capsules (40 mg total) by mouth 2 (two) times daily.  180 capsule  3  . pravastatin (PRAVACHOL) 10 MG tablet Take 1 tablet (10 mg total) by mouth daily.  90 tablet  3  . sodium chloride (OCEAN) 0.65 % SOLN nasal spray Place 1 spray into the nose as needed for congestion.  45 mL  0    Allergies: No Known Allergies  History reviewed. No pertinent family history.  Social History:  reports that he has quit smoking. He does not have any smokeless tobacco history on file. He reports that he does not drink alcohol or use illicit drugs.  Review of Systems: negative except not readily obtainable   Blood pressure 152/82, pulse 68, temperature 98.6 F (37 C), resp. rate 18, SpO2 96.00%. Head: Normocephalic, without obvious abnormality, atraumatic Neck: no adenopathy, no carotid bruit, no JVD, supple, symmetrical, trachea midline and thyroid not enlarged, symmetric, no tenderness/mass/nodules Resp: clear to auscultation bilaterally Cardio: regular rate and rhythm, S1, S2 normal, no murmur, click, rub or gallop GI: Abdomen soft no apparent tenderness no mass Extremities: extremities normal, atraumatic, no cyanosis or edema  Results for orders placed during the hospital encounter of 01/28/12 (from the past 48 hour(s))  CBC     Status: Abnormal   Collection Time   01/28/12  1:51 PM      Component Value Range Comment   WBC 6.5  4.0 - 10.5 K/uL    RBC 4.42  4.22 - 5.81 MIL/uL    Hemoglobin 14.8  13.0 - 17.0 g/dL    HCT 16.1  09.6 - 04.5 %    MCV 89.6  78.0 - 100.0 fL    MCH 33.5  26.0 - 34.0 pg    MCHC 37.4 (*) 30.0 - 36.0 g/dL RULED OUT INTERFERING SUBSTANCES   RDW 12.0  11.5 - 15.5 %    Platelets 248  150 - 400 K/uL   COMPREHENSIVE METABOLIC PANEL     Status: Abnormal   Collection Time   01/28/12  1:51 PM      Component Value Range Comment   Sodium 128 (*) 135 - 145 mEq/L    Potassium 3.6  3.5 - 5.1 mEq/L    Chloride 93 (*) 96 - 112 mEq/L    CO2 25  19 - 32 mEq/L    Glucose, Bld 108 (*)  70 - 99 mg/dL    BUN 10  6 - 23 mg/dL    Creatinine, Ser 4.09  0.50 - 1.35 mg/dL    Calcium 9.1  8.4 - 81.1 mg/dL    Total Protein 7.6  6.0 - 8.3 g/dL    Albumin 3.8  3.5 - 5.2 g/dL    AST 19  0 - 37 U/L    ALT 13  0 - 53 U/L    Alkaline Phosphatase 65  39 - 117 U/L    Total Bilirubin 0.6  0.3 - 1.2 mg/dL    GFR calc non Af Amer 62 (*) >90 mL/min    GFR calc Af Amer 72 (*) >90 mL/min   DIFFERENTIAL     Status: Normal   Collection Time   01/28/12  1:51 PM      Component Value Range Comment   Neutrophils Relative 63  43 - 77 %    Neutro Abs 4.1  1.7 - 7.7 K/uL    Lymphocytes Relative 27  12 - 46 %    Lymphs Abs 1.7  0.7 - 4.0 K/uL    Monocytes Relative 8  3 - 12 %    Monocytes Absolute 0.5  0.1 - 1.0 K/uL    Eosinophils Relative 1  0 - 5 %    Eosinophils Absolute 0.1  0.0 - 0.7 K/uL    Basophils Relative 0  0 - 1 %    Basophils Absolute 0.0  0.0 - 0.1 K/uL   URINALYSIS, ROUTINE W REFLEX MICROSCOPIC     Status: Abnormal   Collection Time   01/28/12  3:17 PM      Component Value Range Comment   Color, Urine YELLOW  YELLOW    APPearance CLEAR  CLEAR    Specific Gravity, Urine 1.017  1.005 - 1.030    pH 8.0  5.0 - 8.0    Glucose, UA NEGATIVE  NEGATIVE mg/dL    Hgb urine dipstick NEGATIVE  NEGATIVE    Bilirubin Urine NEGATIVE  NEGATIVE    Ketones, ur 15 (*) NEGATIVE mg/dL    Protein, ur NEGATIVE  NEGATIVE mg/dL    Urobilinogen, UA 0.2  0.0 - 1.0 mg/dL    Nitrite NEGATIVE  NEGATIVE    Leukocytes, UA NEGATIVE  NEGATIVE MICROSCOPIC NOT DONE ON URINES WITH NEGATIVE PROTEIN, BLOOD, LEUKOCYTES, NITRITE, OR GLUCOSE <1000 mg/dL.   X-ray Chest Pa And Lateral   01/28/2012  *  RADIOLOGY REPORT*  Clinical Data: Shortness of breath, left lung base crackles  CHEST - 2 VIEW  Comparison: Chest x-ray of 03/20/2011  Findings: Bibasilar linear atelectasis or scarring is noted at the lung bases, left greater than right.  No focal infiltrate or effusion is seen.  The heart is mildly enlarged and stable  and a permanent pacemaker remains.  No bony abnormality is seen. Calcifications in the left abdomen probably represent calcified lymph nodes when compared to the prior CT.  IMPRESSION: Bibasilar linear atelectasis or scarring.  Stable cardiomegaly with pacer.   Original Report Authenticated By: Juline Patch, M.D.     Assessment: Refractory nausea and vomiting with inadequate view of the stomach on CT scan poor distensibility could represent an infiltrative lesion. Plan:  Will begin workup with upper endoscopy which will be performed tomorrow. Quadre Bristol C 01/28/2012, 4:44 PM

## 2012-01-28 NOTE — Progress Notes (Signed)
FPTS INTERIM NOTE  Patient has arrived to [facility] hospital via direct admission from clinic.  He currently appears well, but does not have an interest in eating because of continued nausea and vomiting with eating.  Family reports that the nausea is worsened with sitting and is better with laying.  Has had weight loss (9lb in 10 days) and family is very cocnerned because he is now refusing fluids as well as all solids.  Plan to give Zofran PRN, IVF, obtain GI consult ([facility]).  There was also some concern for PNA based on a CT abd, however, does not appear to have infiltrate on CXR.  Will await urine cx before d/c'ing all antibiotics, although unlikely to be new UTI considered normal UA earlier this week in clinic.   Allen [name] 01/28/2012, 2:39 PM

## 2012-01-28 NOTE — Care Management Note (Unsigned)
    Page 1 of 1   01/28/2012     3:42:36 PM   CARE MANAGEMENT NOTE 01/28/2012  Patient:  Allen [name], Allen [name]   Account Number:  [id number]  Date Initiated:  01/28/2012  Documentation initiated by:  GRAVES-BIGELOW,Thaddaeus Granja  Subjective/Objective Assessment:   Pt admitted with GI symptoms and treating for PNA.  Pt is from home with family.     Action/Plan:   CM will continue to monitor for disposition needs.   Anticipated DC Date:  02/01/2012   Anticipated DC Plan:  HOME W HOME HEALTH SERVICES      DC Planning Services  CM consult      Choice offered to / List presented to:             Status of service:  In process, will continue to follow Medicare Important Message given?   (If response is "NO", the following Medicare IM given date fields will be blank) Date Medicare IM given:   Date Additional Medicare IM given:    Discharge Disposition:    Per UR Regulation:  Reviewed for med. necessity/level of care/duration of stay  If discussed at Long Length of Stay Meetings, dates discussed:    Comments:

## 2012-01-28 NOTE — Progress Notes (Signed)
Pt admitted. See H&P

## 2012-01-28 NOTE — H&P (Signed)
Family Medicine Teaching Service HISTORY & PHYSICAL   Patient name: Allen [name] Medical record number: [id number] Date of birth: 06-May-1919 Age: 78 y.o. Gender: male  Primary Care Provider: Tobin Chad, MD   Chief Complaint: Fatigue, Nausea, Vomiting, Chills    HPI  Allen [name] is a 78 y.o. year old male presenting with 3 weeks of persistent Nausea and Vomiting.  He has been evaluated multiple times in clinic as well as the ED.  He has progressively worsened and is vomiting (what he has eaten) multiple times per day.  Is now unable to keep fluids down.  Reports decreased urinary output but did not have a UTI per UA on 8/19.  He has begun to have chills over the past 2 days but no objective fevers.  Family reports symptoms needing to vomiting, stool and urinate at the same time but nothing happens.  No blious vomiting, hemataemesis.  No loose stools, hematachezia or melana.    He underwent CT Abdomen/Pelvis that revealed a hiatal hernia, diverticulosis (no diverticulitis), moderate enlargement of the prostate gland, and non-specific pulmonary nodule.  His lab values have been unremarkable.  ROS   PER HPI            HISTORY:  PMHx:  Patient Active Problem List  Diagnosis  . HYPERLIPIDEMIA  . RESTLESS LEGS SYNDROME  . LOSS, HEARING NOS  . HYPERTENSION, BENIGN  . SECOND DEGREE ATRIOVENTRICULAR HEART BLOCK  . ALLERGIC RHINITIS, SEASONAL  . GASTROESOPHAGEAL REFLUX, NO ESOPHAGITIS  . CONSTIPATION  . BENIGN PROSTATIC HYPERTROPHY, WITH OBSTRUCTION  . Lumbar radiculopathy, chronic  . TSH elevation  . Anemia  . Congestion of upper airway  . Impacted cerumen of left ear  . Dizziness  . Nausea & vomiting    PSHx: Past Surgical History  Procedure Date  . Testicle surgery 1933  . Inguinal hernia repair 1980  . 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 3 children in [place], lives with wife and son.daugher, Jacky Kindle, is main caregiver. Can't read SpanishHas been in [place] for about 1 year. Wife lives here in [place]. Smoked when he was younger for about 20 years (6-7 cigarettes/day).    Family Hx: No family history on file.  Allergies: No Known Allergies  Home Medications:  (Not in a hospital admission)            OBJECTIVE  Vitals: Filed Vitals:   01/28/12 1007 01/28/12 1008 01/28/12 1009  BP: 154/82 154/78 150/78  Pulse: 74 70 74  Temp: 99.2 F (37.3 C)    TempSrc: Oral    Weight: 133 lb 14.4 oz (60.737 kg)    SpO2: 96%     Wt Readings from Last 3 Encounters:  01/28/12 133 lb 14.4 oz (60.737 kg)  01/25/12 137 lb 11.2 oz (62.46 kg)  01/18/12 142 lb (64.411 kg)   PE: GENERAL:  Elderly hispanic male.  Examined in [facility].  In no discomfort; no respiratory distress. PSYCH: Alert and appropriately interactive;   H&N: AT/[place], MMM, no scleral icterus, EOMi THORAX: HEART: RRR, S1/S2 heard, no murmur LUNGS: Crackles in Left Lung Base, otherwise CTA, no wheezing.   Abdomen:  +BS, soft, non-tender, no rigidity, no guarding, no masses/organomegaly EXTREMITIES: Moves all 4 extremities spontaneously, warm well perfused, no edema, bilateral DP and  PT pulses 2/4.      LABS: LABS:  Basename 01/23/12 1824 01/23/12 1640 01/18/12 1655 03/26/11 0925 03/21/11 0850 03/20/11 1752  WBC 6.4 -- 6.6 4.3 9.8 15.7*  HGB 14.7 15.6 14.0 12.0* 10.5* 11.2*  HCT 40.8 46.0 39.4 36.6* 31.2* 32.6*  PLT 240 -- 286 289 177 178    Basename 01/25/12 1524 01/23/12 1640 01/18/12 1655 03/21/11 0850 03/20/11 1752  NA 128* 135 130* 133* 131*  K 4.3 4.7 4.3 3.7 5.1  CL 96 95* 95* 98 97  CO2 27 -- 29 25 26   BUN 16 13 16  25* 23  CREATININE 1.14 1.30 1.29 1.57* 1.54*  CALCIUM 9.4 -- 9.3 8.6 9.0  GLUCOSE 130* 99 103*  104* 122*    Metabolic: No results found for this basename: MG:3,PHOS:3 in the last 8760 hours  Basename 01/25/12 1524 01/18/12 1655 03/20/11 1752  ALT 14 16 10   AST 21 20 17   ALKPHOS 71 76 57  BILITOT 0.4 0.3 0.6  BILIDIR -- -- --    Basename 01/25/12 1524 01/23/12 2114 01/18/12 1655 03/20/11 1752  PROT 7.7 -- 7.6 6.9  ALBUMIN 4.3 -- 4.5 3.4*  PREALBUMIN -- 23.8 -- --   No results found for this basename: CHOL:3,TRIG:3,HDL:3,LDL:3 in the last 8760 hours  Basename 03/20/11 1547  GLUCAP 101*   No results found for this basename: HGBA1C:3 in the last 8760 hours No results found for this basename: MICROALBUR:3,MALB24HUR:3 in the last 8760 hours  Basename 01/23/12 2114 01/18/12 1655  TSH -- 5.836*  T3FREE -- --  FREET4 1.05 --  T3TOTAL -- --  T4TOTAL -- --  THYROIDAB -- --   No results found for this basename: FOLATE:3,VITAMINB12:3 in the last 8760 hours No results found for this basename: IRON:3,TIBC:3,FERRITIN:3 in the last 8760 hours  No results found for this basename: LEAD:1 in the last 8760 hours No components found with this basename: VITAMIND:3 No results found for this basename: AMMONIA:3 in the last 8760 hours No results found for this basename: URICACID:3 in the last 8760 hours  Hematology:  [facility] 01/23/12 1824  LYMPHSABS 1.6  MONOABS 0.5  EOSABS 0.4  BASOSABS 0.0    Basename 01/23/12 1824 01/18/12 1655 03/26/11 0925  RDW 12.1 13.2 15.4  MCV 91.5 92.7 87.6  MCHC 36.0 35.5 32.8  MRET -- -- --   No results found for this basename: PT:5,INR:5,APTT:5 in the last 8760 hours  Basename 01/23/12 2030  OCCULTBLD NEGATIVE    Other Labs:  [facility] 03/21/11 0600 03/20/11 2359 03/20/11 1752  CKTOTAL 66 72 87  TROPONINI <0.30 <0.30 <0.30  TROPONINT -- -- --  CKMBINDEX -- -- --   No results found for this basename: AMYLASE:5,LIPASE:5 in the last 8760 hours No results found for this basename: DDIMER:3 in the last 8760 hours No results found for this  basename: FIBRINOGEN:3 in the last 8760 hours No results found for this basename: LDH:3 in the last 8760 hours  Basename 03/26/11 0925  CRP --  ESRSEDRATE 36*  SEDRATE --  POCTSEDRATE --   No results found for this basename: LATICACIDVEN:3,PROCALCITON:3 in the last 8760 hours  Basename 01/23/12 1640  PHART --  PCO2ART --  PO2ART --  HCO3 --  TCO2 27  ACIDBASEDEF --  O2SAT --   No results found for this basename: PREGTESTUR,PREGSERUM,HCG,HCGQUANT in the last 8760 hours No results found for this basename: TESTOSTERONE in the last 8760 hours  Basename 01/23/12 1640  PTH --  CAION 1.21   No  results found for this basename: SPEP:3,UPEP:3 in the last 8760 hours No results found for this basename: PROTEIN24HR:3 in the last 8760 hours  No results found for this basename: HIV1X2,HAV,HEPAIGM,HEPBIGM,HEPBCAB,HBEAG,HEPCAB in the last 8760 hours No results found for this basename: ANA,RF,SMOOTHMUSCAB,MITOAB in the last 8760 hours  Drug Levels: No results found for this basename: LABOPIA:5,COCAINSCRNUR:5,LABBENZ:5,AMPHETMU:5,THCU:5,LABBARB:5,ETH:5 in the last 8760 hours  No results found for this basename: PHENYTOIN:3,PHENOBARB:3,VALPROATE:3,CBMZ:3,LITHIUM:3 in the last 8760 hours No results found for this basename: DIGOXIN:3 in the last 8760 hours  Urinalysis  Basename 01/25/12 1520 01/23/12 1656 01/18/12 1659  COLORURINE -- -- --  APPEARANCEUR -- -- --  LABSPEC -- 1.015 --  PHURINE -- 7.5 --  GLUCOSEU -- NEGATIVE --  HGBUR -- NEGATIVE --  BILIRUBINUR NEG NEGATIVE NEG  KETONESUR -- NEGATIVE --  PROTEINUR -- NEGATIVE --  UROBILINOGEN 0.2 0.2 0.2  NITRITE NEG NEGATIVE NEG  LEUKOCYTESUR Negative NEGATIVE Negative    MICRO: No results found for this or any previous visit.  IMAGING: Ct Abdomen Pelvis W Contrast  01/26/2012  *RADIOLOGY REPORT*  Clinical Data: Nausea with occasional vomiting for 3 weeks. History of ventral hernia repair and BPH.  No history of malignancy.  CT  ABDOMEN AND PELVIS WITH CONTRAST  Technique:  Multidetector CT imaging of the abdomen and pelvis was performed following the standard protocol during bolus administration of intravenous contrast.  Contrast: 80mL OMNIPAQUE IOHEXOL 300 MG/ML  SOLN  Comparison: Acute abdominal series 01/23/2012 and 10/04/2003.  Findings: Images through the lung bases demonstrate stable pacemaker leads in the right atrium and right ventricle and a moderate sized hiatal hernia. Atelectasis or scarring in both lung bases is similar to prior radiographs.  There is a 7 mm right lower lobe nodule on image 4 which is not clearly seen on prior radiographs.  The liver, gallbladder, biliary system and pancreas appear unremarkable.  An 11 mm low density splenic lesion on image number eight is likely incidental.  The spleen is normal in size.  There is no adrenal mass.  Left renal cysts measure up to 2.3 cm in diameter.  There is no hydronephrosis or enhancing renal mass.  The right kidney appears normal.  The stomach is incompletely distended and suboptimally evaluated. No small bowel abnormalities are identified.  There are diverticular changes of the distal descending and sigmoid colon without surrounding inflammation.  The prostate gland is moderately enlarged with heterogeneity asymmetric to the right and protuberance into the lumen of the bladder.  There is mild bladder wall thickening.  There is diffuse tortuosity and irregular ectasia of the infrarenal abdominal aorta. There is an approximately 1 cm saccular aneurysm arising from the left aspect of the aorta on image 42.  No enlarged abdominal pelvic lymph nodes are seen.  There is a convex right thoracolumbar scoliosis with associated spondylosis.  IMPRESSION:  1.  Hiatal hernia with incomplete gastric distension. 2.  Descending and sigmoid colon diverticulosis without surrounding inflammatory change or fluid collection. 3.  Probable incidental splenic and left renal cysts. 4.  Moderate  enlargement of the prostate gland with protuberance into the bladder lumen. 5.  Diffuse atherosclerosis with small saccular aneurysm of the infrarenal aorta.  6.  Nonspecific right lower lobe pulmonary nodule, not clearly seen on prior radiographs. If the patient is at high risk for bronchogenic carcinoma, follow-up chest CT at 3-6 months is recommended.  If the patient is at low risk for bronchogenic carcinoma, follow-up chest CT at 6-12 months is recommended.  This recommendation  follows the consensus statement: Guidelines for Management of Small Pulmonary Nodules Detected on CT Scans: A Statement from the Fleischner Society as published in Radiology 2005; 237:395-400.   Original Report Authenticated By: Gerrianne Scale, M.D.    Dg Abd Acute W/chest  01/23/2012  *RADIOLOGY REPORT*  Clinical Data: Constipation for 2 weeks with nausea.  Dizziness.  ACUTE ABDOMEN SERIES (ABDOMEN 2 VIEW & CHEST 1 VIEW)  Comparison: 10/04/2003  Findings: Frontal view of the chest demonstrates dual lead pacer with leads right atrium right ventricle. Midline trachea.  Normal heart size and mediastinal contours for age.  No pleural effusion or pneumothorax.  There is improvement in bibasilar atelectasis/scarring.  Abominal films demonstrate no free intraperitoneal air on upright positioning.  No significant air fluid levels.  S-shaped thoracolumbar spine curvature.  No significant bowel dilatation on supine imaging.  Distal gas. Favor vascular or soft tissue calcifications projecting over the upper sacrum.  IMPRESSION:  1.  No acute findings in the abdomen. 2.  Improved bibasilar aeration with a 8 mm nodular density projecting over the right lung base.  Possibly calcified granuloma. Given patient age, a prudent clinical strategy could include follow- up plain films at 6 months to confirm stability of this appearance.  Original Report Authenticated By: Consuello Bossier, M.D.              Assessment & Plan  78 y.o. year old male with  fatigue, malise, chills, nausea and vomiting.  Presenting with worsening symptoms and progressive malaise  1. PULM: (Community Acquired PNA, Pulmonary Nodule) - Pt with CT scan that showed ?consolidation vs atelectasis B with L > R.  Given crackles in L lung base and pts age concern for [facility]  [facility]   *Rocephin *Azithromycin  - No respiratory distress or O2 requirement at time of admission                                      Plan / Followup  - Repeat CXR; consider plane film radiographic delay given clinical worsening - Pt needs OP follow up for Pulmonary nodule    2. GI: (Nausea, Vomiting, Dehydration) - Ongoing for 2-3 weeks prior to admission  [facility] Course   *D5 1/2NS X 1 liter *Zofran                                        Plan / Followup  [ ]  Consider GI stricture given inability to keep any foods down [ ]  Consider GI consult for EGD    3. CV: (Hx of Pacemaker for 2nd Degree Heart Block) - Denies cardiac symptoms  [facility] Course     Will continue home medications                                      Plan / Followup      4. GU: (Enlarged prostate) - Enlarged prostate on CT with elevated PSA of 7 in 2010  [facility] Course  Plan / Followup  Repeat PSA   --- FEN (Hyponatremia) *D5 NS @ 187ml/hr X 8 hours then NS @ 53mL/hr -Cardiac --- PPx: Heparin            DISPOSITION  Pt to telemetry floor given history of pacemaker.  Will provide IVFs and ABX for presumed CAP.  Pt needs N/V monitored and if worsening will need GI consult for consideration of GI stricture vs delayed gastric emptying although no history for autonomic dysfunction.   Andrena Mews, DO [facility] Resident - PGY-2 01/28/2012 10:52 AM

## 2012-01-29 ENCOUNTER — Encounter (HOSPITAL_COMMUNITY): Payer: Self-pay | Admitting: *Deleted

## 2012-01-29 ENCOUNTER — Inpatient Hospital Stay: Payer: Medicare Other | Admitting: Family Medicine

## 2012-01-29 ENCOUNTER — Encounter (HOSPITAL_COMMUNITY)
Admission: AD | Disposition: A | Discharge status: Home / Self Care | Payer: Self-pay | Source: Ambulatory Visit | Attending: Family Medicine

## 2012-01-29 HISTORY — PX: ESOPHAGOGASTRODUODENOSCOPY: SHX5428

## 2012-01-29 LAB — PSA: PSA: 6.32 ng/mL — ABNORMAL HIGH (ref <=4.00)

## 2012-01-29 LAB — BASIC METABOLIC PANEL
CO2: 26 mEq/L (ref 19–32)
Chloride: 98 mEq/L (ref 96–112)
GFR calc Af Amer: 63 mL/min — ABNORMAL LOW (ref >90)
Potassium: 3.6 mEq/L (ref 3.5–5.1)

## 2012-01-29 LAB — CBC
HCT: 35.8 % — ABNORMAL LOW (ref 39.0–52.0)
Hemoglobin: 13 g/dL (ref 13.0–17.0)
RBC: 3.99 MIL/uL — ABNORMAL LOW (ref 4.22–5.81)
WBC: 5.3 10*3/uL (ref 4.0–10.5)

## 2012-01-29 LAB — URINE CULTURE

## 2012-01-29 SURGERY — EGD (ESOPHAGOGASTRODUODENOSCOPY)
Anesthesia: Moderate Sedation

## 2012-01-29 MED ORDER — MIDAZOLAM HCL 5 MG/ML IJ SOLN
INTRAMUSCULAR | Status: AC
  Filled 2012-01-29: qty 2

## 2012-01-29 MED ORDER — BOOST / RESOURCE BREEZE PO LIQD
1.0000 | Freq: Two times a day (BID) | ORAL | Status: DC
  Administered 2012-01-29 – 2012-01-30 (×2): 1 via ORAL

## 2012-01-29 MED ORDER — BUTAMBEN-TETRACAINE-BENZOCAINE 2-2-14 % EX AERO
INHALATION_SPRAY | CUTANEOUS | Status: DC | PRN
  Administered 2012-01-29: 2 via TOPICAL

## 2012-01-29 MED ORDER — SODIUM CHLORIDE 0.45 % IV SOLN: INTRAVENOUS | Status: DC

## 2012-01-29 MED ORDER — PHENOL 1.4 % MT LIQD
1.0000 | OROMUCOSAL | Status: DC | PRN
  Filled 2012-01-29: qty 177

## 2012-01-29 MED ORDER — FENTANYL CITRATE 0.05 MG/ML IJ SOLN
INTRAMUSCULAR | Status: DC | PRN
  Administered 2012-01-29: 25 ug via INTRAVENOUS

## 2012-01-29 MED ORDER — SODIUM CHLORIDE 0.9 % IV SOLN: INTRAVENOUS | Status: DC

## 2012-01-29 MED ORDER — FENTANYL CITRATE 0.05 MG/ML IJ SOLN
INTRAMUSCULAR | Status: AC
  Filled 2012-01-29: qty 2

## 2012-01-29 MED ORDER — MIDAZOLAM HCL 10 MG/2ML IJ SOLN
INTRAMUSCULAR | Status: DC | PRN
  Administered 2012-01-29: 1 mg via INTRAVENOUS
  Administered 2012-01-29: .5 mg via INTRAVENOUS

## 2012-01-29 NOTE — Progress Notes (Signed)
INITIAL ADULT NUTRITION ASSESSMENT Date: 01/29/2012   Time: 2:10 PM  Reason for Assessment: Malnutrition Screening  ASSESSMENT: Male 78 y.o.  Dx: n/v 2/2 unknown etiology  Hx:  Past Medical History  Diagnosis Date  . Pacemaker   . Hypertension   . GERD (gastroesophageal reflux disease)   . H/O hiatal hernia   . Arthritis     hands and feet   Past Surgical History  Procedure Date  . Testicle surgery 1933  . Inguinal hernia repair 1980  . Pacemaker placement 03/2009    syncope - bradycardia, and 2nd degree block   . Hernia repair    Related Meds:     . amLODipine  5 mg Oral Daily  . aspirin EC  81 mg Oral Daily  . gabapentin  600 mg Oral TID  . heparin  5,000 Units Subcutaneous Q8H  . mirtazapine  7.5 mg Oral QHS  . pantoprazole  40 mg Oral Daily  . simvastatin  5 mg Oral q1800  . sodium chloride  3 mL Intravenous Q12H  . Tamsulosin HCl  0.4 mg Oral QPC supper  . DISCONTD: azithromycin  500 mg Intravenous Q24H  . DISCONTD: cefTRIAXone (ROCEPHIN)  IV  1 g Intravenous Q24H   Ht:  5\' 3"  (160 cm)  Wt:   133 lb (60.7 kg)  Ideal Wt:    56.4 kg % Ideal Wt: 108%  Wt Readings from Last 15 Encounters:  01/28/12 133 lb 14.4 oz (60.737 kg)  01/25/12 137 lb 11.2 oz (62.46 kg)  01/18/12 142 lb (64.411 kg)  01/01/12 138 lb 3 oz (62.681 kg)  09/22/11 136 lb (61.689 kg)  08/25/11 139 lb 8 oz (63.277 kg)  04/16/11 139 lb (63.05 kg)  03/26/11 138 lb (62.596 kg)  03/19/11 138 lb 6.4 oz (62.778 kg)  03/02/11 136 lb 4.8 oz (61.825 kg)  11/17/10 130 lb (58.968 kg)  03/13/10 142 lb (64.411 kg)  03/06/10 139 lb (63.05 kg)  12/17/09 137 lb 3.2 oz (62.234 kg)  10/31/09 136 lb 1.6 oz (61.735 kg)  Usual Wt: 142 lb % Usual Wt: 94%; 6% wt loss x 3 weeks  BMI 23.7 - WNL  Food/Nutrition Related Hx: vomiting and nausea x 3 weeks  Labs:  CMP     Component Value Date/Time   NA 131* 01/29/2012 0700   K 3.6 01/29/2012 0700   CL 98 01/29/2012 0700   CO2 26 01/29/2012 0700   GLUCOSE 97 01/29/2012 0700   BUN 9 01/29/2012 0700   CREATININE 1.14 01/29/2012 0700   CREATININE 1.14 01/25/2012 1524   CALCIUM 8.5 01/29/2012 0700   PROT 7.6 01/28/2012 1351   ALBUMIN 3.8 01/28/2012 1351   AST 19 01/28/2012 1351   ALT 13 01/28/2012 1351   ALKPHOS 65 01/28/2012 1351   BILITOT 0.6 01/28/2012 1351   GFRNONAA 54* 01/29/2012 0700   GFRAA 63* 01/29/2012 0700   Sodium  Date/Time Value Range Status  01/29/2012  7:00 AM 131* 135 - 145 mEq/L Final  01/28/2012  1:51 PM 128* 135 - 145 mEq/L Final  01/25/2012  3:24 PM 128* 135 - 145 mEq/L Final    Potassium  Date/Time Value Range Status  01/29/2012  7:00 AM 3.6  3.5 - 5.1 mEq/L Final  01/28/2012  1:51 PM 3.6  3.5 - 5.1 mEq/L Final  01/25/2012  3:24 PM 4.3  3.5 - 5.3 mEq/L Final   Prealbumin  Date/Time Value Range Status  01/23/2012  9:14 PM 23.8  17.0 - 34.0  mg/dL Final     Intake/Output Summary (Last 24 hours) at 01/29/12 1410 Last data filed at 01/29/12 1116  Gross per 24 hour  Intake      0 ml  Output    300 ml  Net   -300 ml   Diet Order: Full Liquid  Supplements/Tube Feeding: none  IVF:     sodium chloride Last Rate: 75 mL/hr at 01/29/12 1349  dextrose 5 % and 0.9% NaCl Last Rate: 125 mL/hr at 01/28/12 1352  DISCONTD: sodium chloride   DISCONTD: sodium chloride     Estimated Nutritional Needs:   Kcal: 1300 - 1450 kcal Protein: 67 - 80 grams Fluid:  1.5 - 1.8 liters daily  Pt seen in ED on 8/17 for issues with n/v. Pt had very decreased appetite x 2 weeks. Pt was prescribed remeron and zofran. Pt returned to clinic with similar symptoms and was admitted 8/22 (yesterday.)  Pt now has worsening n/v; vomits multiple times a day and vomits what he has eaten. CT of abdomen showed hiatal hernia, diverticulosis, moderate enlargement of the prostate gland, and non-specific pulmonary nodule.   Pt with 9 lb wt loss x 10 days. Family concerned because pt began to refuse fluids yesterday (as well as all solids.)  Endocoscopy  on 8/23 revealed moderate to large hiatal hernia. Plan to proceed with Gastric Emptying Study. Family said pt ate nectarine this morning despite NPO diet order. Family reports that pt will eat American foods.  Pt meets criteria for severe malnutrition in the context of acute illness as evidenced by 7% wt loss x 3 weeks, and intake of <50% of estimated energy requirement x at least 5 days.  NUTRITION DIAGNOSIS: -Inadequate oral intake (NI-2.1).  Status: Ongoing  RELATED TO: GI distress  AS EVIDENCE BY: recent poor meal completion and vomiting; wt loss.  MONITORING/EVALUATION(Goals): Goal: Pt to meet >/= 90% of their estimated nutrition needs Monitor: weights, labs, PO intake, I/O's  EDUCATION NEEDS: -Education not appropriate at this time  INTERVENTION: 1. Resource Breeze po BID, each supplement provides 250 kcal and 9 grams of protein. 2. Advance diet per MD discretion 3. RD to continue to follow nutrition care plan  DOCUMENTATION CODES Per approved criteria  -Severe malnutrition in the context of acute illness or injury   Jarold Motto MS, RD, LDN Pager: [phone] After-hours pager: [phone]

## 2012-01-29 NOTE — Progress Notes (Addendum)
Patient ID: Allen [name], male   DOB: 04/20/1919, 78 y.o.   MRN: [id number] Family Medicine Teaching Service Daily Progress Note Service Page: [phone]  Subjective:  Pt slept well overnight per daughter.  He drank a small amount of ginger ale and some crackers yesterday. Zofran did help him feel less nauseous.  No vomiting overnight.  Pt did say he felt weak this morning per nursing.    Objective: Temp:  [97.5 F (36.4 C)-99.2 F (37.3 C)] 97.5 F (36.4 C) (08/23 0500) Pulse Rate:  [67-74] 69  (08/23 0500) Resp:  [18] 18  (08/22 1400) BP: (96-165)/(61-82) 119/73 mmHg (08/23 0500) SpO2:  [94 %-96 %] 95 % (08/23 0500) Weight:  [133 lb 14.4 oz (60.737 kg)] 133 lb 14.4 oz (60.737 kg) (08/22 1007) Exam: General: sleeping comfortably, in no acute distress Cardiovascular: regular rate and rhythm Respiratory: crackles in base of left lung Abdomen: nontender, nondistended  I have reviewed the patient's medications, labs, imaging, and diagnostic testing.  Notable results are summarized below.  CBC BMET   Lab 01/29/12 0700 01/28/12 1351 01/23/12 1824  WBC 5.3 6.5 6.4  HGB 13.0 14.8 14.7  HCT 35.8* 39.6 40.8  PLT 208 248 240    Lab 01/29/12 0700 01/28/12 1351 01/25/12 1524  NA 131* 128* 128*  K 3.6 3.6 4.3  CL 98 93* 96  CO2 26 25 27   BUN 9 10 16   CREATININE 1.14 1.02 1.14  GLUCOSE 97 108* 130*  CALCIUM 8.5 9.1 9.4    Imaging/Diagnostic Tests: CT 8/20: IMPRESSION:  1. Hiatal hernia with incomplete gastric distension.  2. Descending and sigmoid colon diverticulosis without surrounding  inflammatory change or fluid collection.  3. Probable incidental splenic and left renal cysts.  4. Moderate enlargement of the prostate gland with protuberance  into the bladder lumen.  5. 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  6. Nonspecific right lower lobe pulmonary nodule, not clearly seen  on prior radiographs. If the patient is at  high risk for  bronchogenic carcinoma, follow-up chest CT at 3-6 months is  recommended. If the patient is at low risk for bronchogenic  carcinoma, follow-up chest CT at 6-12 months is recommended. This  recommendation follows the consensus statement: Guidelines for  Management of Small Pulmonary Nodules Detected on CT Scans: A  Statement from the Fleischner Society as published in Radiology  2005; 237:395-400.  CXR 8/22: Findings: Bibasilar linear atelectasis or scarring is noted at the  lung bases, left greater than right. No focal infiltrate or  effusion is seen. The heart is mildly enlarged and stable and a  permanent pacemaker remains. No bony abnormality is seen.  Calcifications in the left abdomen probably represent calcified  lymph nodes when compared to the prior CT.   IMPRESSION:  Bibasilar linear atelectasis or scarring. Stable cardiomegaly with  pacer.  Assessment: 78 yo male with nausea, vomiting, diarrhea who came in with a question of pneumonia that was ruled out by negative CXR and fact that pt has been afebrile with a normal white count.  Nausea/vomiting has been going on for three weeks and GI concerned for possible infiltrative lesion.  Plan: 1)2--3 weeks of nausea, vomiting, diarrhea: Pt has had symptoms for several weeks along with weight loss of 9lbs in 10 days.  Nausea is worse when he sits up and when he tries to eat or drink anything.  Zofran helped his nausea yesterday.  CT showed hiatal hernia and poorly distensible stomach  that could represent an infiltrative lesion. -GI consulted, EGD today -started protonix 40 daily -Zofran prn -pt recently started on Mirtazapine 7.5 mg qHS  2) ?CAP -spot seen on CT along with LLL crackles suspicious for CAP, pt was started on Rocephin and Azithromycin -CXR in hospital was negative for pneumonia findings -UA negative -d/c'd antibiotics because no source for infection, pt afebrile and without O2  requirement.  3)pulmonary nodule -discuss goals of care with pt and family -arrange outpatient follow-up if desired  4)2nd degree heart block: pt has pacemaker -continue home amlodipine, pravastatin, aspirin  5) enlarged prostate on CT, PSA of 7 in 2010 -discuss goals of care with family, defer to PCP for work-up if desired  6) FEN -NS 75 ml/hr -encourage to eat after EGD  7)ppx -heparin  8) dispo: pending results of EGD and GI recs.  Georgina Pillion, Med Student 01/29/2012, 8:54 AM  UPPER LEVEL ADDENDUM: I have seen and examined patient. I agree with assessment and plan as outlined by medical student Daphine Deutscher above. In brief, this is a 78 yo M presenting with increasing fatigue, N/V and chills at home. He had a concern for pneumonia on recent CT, but CXR does not indicate pneumonia. Overall patient states he feels "tired" today but somewhat improved. Daughter is at bedside and interpreting for patient. He is scheduled for EGD today.  O:  Filed Vitals:   01/29/12 1016  BP: 127/75  Pulse:   Temp: 97.8 F (36.6 C)  Resp: 11  Gen: Awake, sitting up in bed talking with daugher HEENT: AT, [place] CV: RRR Pulm: No increased WOB, CTAB Abd: Soft, nontender Neuro: grossly intact  A/P: 78 yo M with fatigue, nausea and vomiting - GI: Going for EDG today to evaluate cause of decreased PO intake and nausea. We appreciate GI care of this patient. Will allow diet, per GI recs. Continue PPI and Mirtazepine  - ID: Concern for pneumonia from prior CT, but CXR does not show pneumonia. No fevers or leukocytosis therefore d/c antibiotics - Pulm: Pulmonary nodule to be evaluated as an outpatient - Heart block: Stable. Continue home medications - Dispo: Patient and family should have goals of care discussion. This is difficult to arrange given the fact that his family interprets for him. We will attempt to facilitate this discussion with a non-family interpreter. Discharge home with family is  pending further workup.  Amber M. Hairford, M.D. 01/29/2012 10:47 AM

## 2012-01-29 NOTE — Progress Notes (Signed)
Spoke to [facility] in Animal nutritionist Med she stated they will try to get Gastric Emptying Study and to keep patient NPO including meds

## 2012-01-29 NOTE — Progress Notes (Signed)
Patient and family told to eat until i could speak with nuclear med about gastric emptying study.  Went back to room to tell patient and family the test would be done today but patient had eaten some fruit.  MD and nuclear med notified. Dr. Pollie Meyer to reorder with npo for Monday am

## 2012-01-29 NOTE — Progress Notes (Signed)
Seen and examined.  We have determined patient does not have pneumonia and are not treating with antibiotics.  We do not have a good etiology for his nausea.  A hopeful sign is that it seems to be improving.  He is [place] for the first time in weeks.  I will advance his diet as tolerated.  Gastric emptying study currently scheduled for Monday.  I would be OK with DC sooner if he has turned the corner and is tolerating PO well.  Recheck in am.

## 2012-01-29 NOTE — Progress Notes (Signed)
Patient complained of dizziness once he got back from endoscopy Neuro check benign.  Dr. Pollie Meyer notified no new orders recieved

## 2012-01-29 NOTE — Progress Notes (Signed)
Patient ID: Allen [name], male   DOB: 1918-09-07, 78 y.o.   MRN: [id number] FMTS Attending Admission Note: Denny Levy MD [phone] pager office [phone] I  have seen and examined this patient, reviewed their chart. I have discussed this patient with the resident. I agree with the resident's findings, assessment and care plan. Dr. Berline Chough and I agree that he needs hsopitalization as he is essentially taking no PO other than a little water at home. He has new crackles at left base---suspicious for pneumonia. Some atelaectasis seen on CT 48 hours ago--this could be pneumonia in setting of dehydration. Have discussed with his family and will admit with tentative dx of pneumona and will need further workup. I have discussed at the time of admission with Drs Berline Chough, Louanne Belton (his PCP) and McGill (inpatient).

## 2012-01-29 NOTE — Op Note (Signed)
[facility] [address] [place], [address]   ENDOSCOPY PROCEDURE REPORT  PATIENT: Allen [name], Allen [name]  MR#: [id number] BIRTHDATE: 1919-05-19 , 89  yrs. old GENDER: Male ENDOSCOPIST:Versia Mignogna Madilyn Fireman, MD REFERRED BY: PROCEDURE DATE:  01/29/2012 PROCEDURE: ASA CLASS: INDICATIONS:   refractory nausea and vomiting MEDICATION:    25 mcg fentanyl, 2 mg Versed TOPICAL ANESTHETIC:    Cetacaine spray  DESCRIPTION OF PROCEDURE:   esophageal mucosa normal. There was a 3 cm broad hiatal hernia. No esophageal ring or stricture was seen. Gastric mucosa was entirely normal with a small pool of bilious material in the dependent portion. Pylorus was patent Duodenum was normal     COMPLICATIONS: None  ENDOSCOPIC IMPRESSION:moderate to large hiatal hernia otherwise normal study.definite explanation for his vomiting.  RECOMMENDATIONS:obtain nuclear medicine gastric emptying study.    _______________________________ Rosalie DoctorDorena Cookey, MD 01/29/2012 11:01 AM

## 2012-01-29 NOTE — Progress Notes (Signed)
[facility] Progress Note  Subjective:  No Change in status. No vomiting since last night.  Objective: Vital signs in last 24 hours: Temp:  [97.5 F (36.4 C)-98.6 F (37 C)] 97.8 F (36.6 C) (08/23 1016) Pulse Rate:  [67-69] 69  (08/23 0500) Resp:  [9-52] 35  (08/23 1055) BP: (96-165)/(51-82) 98/56 mmHg (08/23 1055) SpO2:  [93 %-98 %] 95 % (08/23 1055) Weight change:    PE: Unchanged  Lab Results: Results for orders placed during the hospital encounter of 01/28/12 (from the past 24 hour(s))  CBC     Status: Abnormal   Collection Time   01/28/12  1:51 PM      Component Value Range   WBC 6.5  4.0 - 10.5 K/uL   RBC 4.42  4.22 - 5.81 MIL/uL   Hemoglobin 14.8  13.0 - 17.0 g/dL   HCT 16.1  09.6 - 04.5 %   MCV 89.6  78.0 - 100.0 fL   MCH 33.5  26.0 - 34.0 pg   MCHC 37.4 (*) 30.0 - 36.0 g/dL   RDW 40.9  81.1 - 91.4 %   Platelets 248  150 - 400 K/uL  COMPREHENSIVE METABOLIC PANEL     Status: Abnormal   Collection Time   01/28/12  1:51 PM      Component Value Range   Sodium 128 (*) 135 - 145 mEq/L   Potassium 3.6  3.5 - 5.1 mEq/L   Chloride 93 (*) 96 - 112 mEq/L   CO2 25  19 - 32 mEq/L   Glucose, Bld 108 (*) 70 - 99 mg/dL   BUN 10  6 - 23 mg/dL   Creatinine, Ser 7.82  0.50 - 1.35 mg/dL   Calcium 9.1  8.4 - 95.6 mg/dL   Total Protein 7.6  6.0 - 8.3 g/dL   Albumin 3.8  3.5 - 5.2 g/dL   AST 19  0 - 37 U/L   ALT 13  0 - 53 U/L   Alkaline Phosphatase 65  39 - 117 U/L   Total Bilirubin 0.6  0.3 - 1.2 mg/dL   GFR calc non Af Amer 62 (*) >90 mL/min   GFR calc Af Amer 72 (*) >90 mL/min  DIFFERENTIAL     Status: Normal   Collection Time   01/28/12  1:51 PM      Component Value Range   Neutrophils Relative 63  43 - 77 %   Neutro Abs 4.1  1.7 - 7.7 K/uL   Lymphocytes Relative 27  12 - 46 %   Lymphs Abs 1.7  0.7 - 4.0 K/uL   Monocytes Relative 8  3 - 12 %   Monocytes Absolute 0.5  0.1 - 1.0 K/uL   Eosinophils Relative 1  0 - 5 %   Eosinophils Absolute 0.1  0.0 -  0.7 K/uL   Basophils Relative 0  0 - 1 %   Basophils Absolute 0.0  0.0 - 0.1 K/uL  PSA     Status: Abnormal   Collection Time   01/28/12  1:51 PM      Component Value Range   PSA 6.32 (*) <=4.00 ng/mL  URINALYSIS, ROUTINE W REFLEX MICROSCOPIC     Status: Abnormal   Collection Time   01/28/12  3:17 PM      Component Value Range   Color, Urine YELLOW  YELLOW   APPearance CLEAR  CLEAR   Specific Gravity, Urine 1.017  1.005 - 1.030   pH 8.0  5.0 -  8.0   Glucose, UA NEGATIVE  NEGATIVE mg/dL   Hgb urine dipstick NEGATIVE  NEGATIVE   Bilirubin Urine NEGATIVE  NEGATIVE   Ketones, ur 15 (*) NEGATIVE mg/dL   Protein, ur NEGATIVE  NEGATIVE mg/dL   Urobilinogen, UA 0.2  0.0 - 1.0 mg/dL   Nitrite NEGATIVE  NEGATIVE   Leukocytes, UA NEGATIVE  NEGATIVE  BASIC METABOLIC PANEL     Status: Abnormal   Collection Time   01/29/12  7:00 AM      Component Value Range   Sodium 131 (*) 135 - 145 mEq/L   Potassium 3.6  3.5 - 5.1 mEq/L   Chloride 98  96 - 112 mEq/L   CO2 26  19 - 32 mEq/L   Glucose, Bld 97  70 - 99 mg/dL   BUN 9  6 - 23 mg/dL   Creatinine, Ser 4.09  0.50 - 1.35 mg/dL   Calcium 8.5  8.4 - 81.1 mg/dL   GFR calc non Af Amer 54 (*) >90 mL/min   GFR calc Af Amer 63 (*) >90 mL/min  CBC     Status: Abnormal   Collection Time   01/29/12  7:00 AM      Component Value Range   WBC 5.3  4.0 - 10.5 K/uL   RBC 3.99 (*) 4.22 - 5.81 MIL/uL   Hemoglobin 13.0  13.0 - 17.0 g/dL   HCT 91.4 (*) 78.2 - 95.6 %   MCV 89.7  78.0 - 100.0 fL   MCH 32.6  26.0 - 34.0 pg   MCHC 36.3 (*) 30.0 - 36.0 g/dL   RDW 21.3  08.6 - 57.8 %   Platelets 208  150 - 400 K/uL    Studies/Results: X-ray Chest Pa And Lateral   01/28/2012  *RADIOLOGY REPORT*  Clinical Data: Shortness of breath, left lung base crackles  CHEST - 2 VIEW  Comparison: Chest x-ray of 03/20/2011  Findings: Bibasilar linear atelectasis or scarring is noted at the lung bases, left greater than right.  No focal infiltrate or effusion is seen.  The  heart is mildly enlarged and stable and a permanent pacemaker remains.  No bony abnormality is seen. Calcifications in the left abdomen probably represent calcified lymph nodes when compared to the prior CT.  IMPRESSION: Bibasilar linear atelectasis or scarring.  Stable cardiomegaly with pacer.   Original Report Authenticated By: Juline Patch, M.D.     EGD shows a fairly large hiatal hernia otherwise normal   Assessment: Nausea and vomiting etiology unclear from EGD and CT scan.  Plan: This point we'll obtain nuclear medicine gastric emptying scan.    Calandra Madura C 01/29/2012, 11:04 AM

## 2012-01-30 MED ORDER — TAMSULOSIN HCL 0.4 MG PO CAPS: 0.4000 mg | ORAL_CAPSULE | Freq: Every day | ORAL | Status: DC

## 2012-01-30 MED ORDER — BOOST / RESOURCE BREEZE PO LIQD: 1.0000 | Freq: Two times a day (BID) | ORAL | Status: DC

## 2012-01-30 MED ORDER — ONDANSETRON HCL 4 MG PO TABS: 4.0000 mg | ORAL_TABLET | Freq: Three times a day (TID) | ORAL | Status: AC | PRN

## 2012-01-30 NOTE — Progress Notes (Signed)
Patient ID: Allen [name], male   DOB: [birth year], 78 y.o.   MRN: [id number] Family Medicine Teaching Service Daily Progress Note Service Page: [phone]  Subjective:  Son present at bedside, who interprets. Patient ate small amt breakfast eggs and muffing. Mild nausea, no emesis in 2 days. Patient agreeable and ready for discharge to home.   Objective: Temp:  [97.4 F (36.3 C)-98.3 F (36.8 C)] 97.4 F (36.3 C) (08/24 0500) Pulse Rate:  [61-67] 61  (08/24 0500) Resp:  [9-52] 16  (08/23 2200) BP: (78-151)/(50-94) 120/74 mmHg (08/24 0500) SpO2:  [93 %-98 %] 95 % (08/24 0500)  Exam: General: sleeping comfortably, in no acute distress Cardiovascular: regular rate and rhythm Respiratory: bibasilar crackles. No wheeze or distress. Abdomen: nontender, nondistended, BS+ LE: no edema.   I have reviewed the patient's medications, labs, imaging, and diagnostic testing.  Notable results are summarized below.  CBC BMET   Lab 01/29/12 0700 01/28/12 1351 01/23/12 1824  WBC 5.3 6.5 6.4  HGB 13.0 14.8 14.7  HCT 35.8* 39.6 40.8  PLT 208 248 240    Lab 01/29/12 0700 01/28/12 1351 01/25/12 1524  NA 131* 128* 128*  K 3.6 3.6 4.3  CL 98 93* 96  CO2 26 25 27   BUN 9 10 16   CREATININE 1.14 1.02 1.14  GLUCOSE 97 108* 130*  CALCIUM 8.5 9.1 9.4    Imaging/Diagnostic Tests: CT 8/20: IMPRESSION:  1. Hiatal hernia with incomplete gastric distension.  2. Descending and sigmoid colon diverticulosis without surrounding  inflammatory change or fluid collection.  3. Probable incidental splenic and left renal cysts.  4. Moderate enlargement of the prostate gland with protuberance  into the bladder lumen.  5. 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  6. Nonspecific right lower lobe pulmonary nodule, not clearly seen  on prior radiographs. If the patient is at high risk for  bronchogenic carcinoma, follow-up chest CT at 3-6 months is  recommended.  If the patient is at low risk for bronchogenic  carcinoma, follow-up chest CT at 6-12 months is recommended. This  recommendation follows the consensus statement: Guidelines for  Management of Small Pulmonary Nodules Detected on CT Scans: A  Statement from the Fleischner Society as published in Radiology  2005; 237:395-400.  CXR 8/22: Findings: Bibasilar linear atelectasis or scarring is noted at the  lung bases, left greater than right. No focal infiltrate or  effusion is seen. The heart is mildly enlarged and stable and a  permanent pacemaker remains. No bony abnormality is seen.  Calcifications in the left abdomen probably represent calcified  lymph nodes when compared to the prior CT.   IMPRESSION:  Bibasilar linear atelectasis or scarring. Stable cardiomegaly with  pacer.  Assessment: 78 yo Hispanic male with history of GERD, HTN with nausea, vomiting, diarrhea of uncertain etiology.  Plan: 1)Nausea, vomiting, diarrhea: subacute, with weight loss of 9lbs in 10 days. Currently emesis resolved and tolerating PO intake.  CT showed hiatal hernia and poorly distensible stomach that could represent an infiltrative lesion. -EGD shows hiatal hernia, otherwise negative -plans for gastric emptying study, will attempt to schedule as outpatient. -protonix 40 daily -Zofran prn for rx at home. -pt recently started on Mirtazapine 7.5 mg qHS -monitor electrolytes.  2)pulmonary nodule -arrange outpatient follow-up if desired  3)2nd degree heart block: pt has pacemaker -continue home amlodipine, pravastatin, aspirin  6) enlarged prostate on CT, PSA of 7 in 2010 -discuss goals of care with family, defer to PCP  for work-up if desired  7) FEN -NS 75 ml/hr, will DC PIV. -improved PO intake -hyponatremia improved  8)ppx -heparin  9) dispo: Plan for DC home today. Patient desires return home, plenty of family at home for support.  Lloyd Huger, MD PGY-3 01/30/2012, 8:47 AM

## 2012-01-30 NOTE — Discharge Summary (Signed)
Physician Discharge Summary  Patient ID: Allen [name] MRN: [id number] DOB/AGE: [birth year] 78 y.o.  Admit date: 01/28/2012 Discharge date: 01/30/2012  Admission Diagnoses: Nausea and vomiting Diagnosis  . HYPERLIPIDEMIA  . RESTLESS LEGS SYNDROME  . LOSS, HEARING NOS  . HYPERTENSION, BENIGN  . SECOND DEGREE ATRIOVENTRICULAR HEART BLOCK  . ALLERGIC RHINITIS, SEASONAL  . GASTROESOPHAGEAL REFLUX, NO ESOPHAGITIS  . CONSTIPATION  . BENIGN PROSTATIC HYPERTROPHY, WITH OBSTRUCTION  . Lumbar radiculopathy, chronic  . TSH elevation  . Anemia  . Congestion of upper airway  . Impacted cerumen of left ear  . Dizziness  . Nausea & vomiting   Hyponatremia  Discharge Diagnoses:  Active Problems:      Nausea & vomiting, improved . HYPERLIPIDEMIA  . RESTLESS LEGS SYNDROME  . LOSS, HEARING NOS  . HYPERTENSION, BENIGN  . SECOND DEGREE ATRIOVENTRICULAR HEART BLOCK  . ALLERGIC RHINITIS, SEASONAL  . GASTROESOPHAGEAL REFLUX, NO ESOPHAGITIS  . CONSTIPATION  . BENIGN PROSTATIC HYPERTROPHY, WITH OBSTRUCTION  . Lumbar radiculopathy, chronic  . TSH elevation  . Anemia  . Congestion of upper airway  . Impacted cerumen of left ear  . Dizziness  . Hyponatremia, improved     Discharged Condition: fair  Hospital Course: 78 yo male with HTN, BPH, GERD presents subacutely with several weeks of nausea, emesis, malaise. He was admitted from clinic for concern of pneumonia as well, subsequently ruled out for pneumonia without CT evidence or clinical respiratory symptoms. Empiric antibiotics were discontinued. Noted a RLL pulmonary nodule incidental finding as described below.   Evaluated by gastroenterology and concern for possible infiltrative gastric lesion, though not seen on EGD. Plan was for gastric emptying study, but patient ate some food and this was postponed until Monday due to failure to be NPO.  He clinically improved with no emesis in 2 days prior to discharge. Tolerating  small amounts of soft foods, including eggs. Patient and son agreeable for discharge with anti-emetics prn. Plan for gastric emptying study to be performed as outpatient, though unable to coordinate this on the weekend. Advised to eat small portions as tolerated and call clinic on Monday morning so that test can be rescheduled.   Consults: Gastroenterology-recommended EGD and gastric emptying study (to be done as outpatient)    Significant Diagnostic Studies:  CT Abd/pelvis 8/20:  IMPRESSION:  1. Hiatal hernia with incomplete gastric distension. 2. Descending and sigmoid colon diverticulosis without surrounding inflammatory change or fluid collection. 3. Probable incidental splenic and left renal cysts. 4. Moderate enlargement of the prostate gland with protuberance  into the bladder lumen. 5. Diffuse atherosclerosis with small saccular aneurysm of the infrarenal aorta.  6. Nonspecific right lower lobe pulmonary nodule, not clearly seen on prior radiographs. If the patient is at high risk for bronchogenic carcinoma, follow-up chest CT at 3-6 months is recommended. If the patient is at low risk for bronchogenic carcinoma, follow-up chest CT at 6-12 months is recommended. This recommendation follows the consensus statement: Guidelines for Management of Small Pulmonary Nodules Detected on CT Scans: A Statement from the Fleischner Society as published in Radiology 2005; 237:395-400.   CXR 8/22:  Findings: Bibasilar linear atelectasis or scarring is noted at the lung bases, left greater than right. No focal infiltrate or effusion is seen. The heart is mildly enlarged and stable and a permanent pacemaker remains. No bony abnormality is seen. Calcifications in the left abdomen probably represent calcified lymph nodes when compared to the prior CT.   IMPRESSION:  Bibasilar linear atelectasis or  scarring. Stable cardiomegaly with  Pacer.  EGD: hiatal hernia, otherwise negative.  FOB  negative.  Treatments: IV hydration and antiemetics.  Discharge Exam: Blood pressure 120/74, pulse 61, temperature 97.4 F (36.3 C), temperature source Oral, resp. rate 16, SpO2 95.00%.  Exam:  General: sleeping comfortably, in no acute distress, lying flat Cardiovascular: regular rate and rhythm, no murmur Respiratory: bibasilar crackles. No wheeze or distress.  Abdomen: nontender, nondistended, BS+  LE: no edema.    Disposition: 01-Home or Self Care  Discharge Orders    Future Appointments: Provider: Department: Dept Phone: Center:   02/23/2012 3:30 PM Beverley Fiedler, MD [facility] Office [phone] [facility]     Future Orders Please Complete By Expires   Diet - low sodium heart healthy      Increase activity slowly      Discharge instructions      Comments:   Eat small meals, liquids, soft foods as tolerated.  Take zofran as needed for nausea. Do not eat or drink anything after 10 pm on Sunday night.   Call MD for:  persistant nausea and vomiting      Call MD for:  severe uncontrolled pain        Medication List  As of 01/30/2012 11:09 AM   TAKE these medications         acetaminophen 650 MG CR tablet   Commonly known as: TYLENOL   Take 650 mg by mouth every 8 (eight) hours as needed.      amLODipine 5 MG tablet   Commonly known as: NORVASC   Take 1 tablet (5 mg total) by mouth daily. For high blood pressure. Label in Spanish.      aspirin EC 81 MG tablet   Take 81 mg by mouth daily.      feeding supplement Liqd   Take 1 Container by mouth 2 (two) times daily between meals.      Fish Oil Oil   Take 1 capsule by mouth 3 (three) times daily.      gabapentin 600 MG tablet   Commonly known as: NEURONTIN   Take 300-600 mg by mouth 3 (three) times daily. Take 1/2 a tablet 3 times a day. Take 1 tablet at bedtime.      loratadine 10 MG tablet   Commonly known as: CLARITIN   Take 1 tablet (10 mg total) by mouth daily as needed for allergies. One daily as  needed          mirtazapine 7.5 MG tablet   Commonly known as: REMERON   Take 1 tablet (7.5 mg total) by mouth at bedtime.      omeprazole 20 MG capsule   Commonly known as: PRILOSEC   Take 2 capsules (40 mg total) by mouth 2 (two) times daily.      ondansetron 4 MG tablet   Commonly known as: ZOFRAN   Take 1 tablet (4 mg total) by mouth every 8 (eight) hours as needed for nausea.      pravastatin 10 MG tablet   Commonly known as: PRAVACHOL   Take 1 tablet (10 mg total) by mouth daily.      sodium chloride 0.65 % Soln nasal spray   Commonly known as: OCEAN   Place 1 spray into the nose as needed for congestion.      Tamsulosin HCl 0.4 MG Caps   Commonly known as: FLOMAX   Take 1 capsule (0.4 mg total) by mouth daily after supper.  Follow-up Information    Follow up with Tobin Chad, MD. Schedule an appointment as soon as possible for a visit in 2 days.   Contact information:   [address] [place] [address] [phone]          Follow up issues:  1. Gastric emptying study to be coordinated as an outpatient. GI follow up info provided. 2. Pulmonary nodule surveillance, though not likely clinically significant depending on patient's desires for care measures. 3. Remeron titration, patient intermittently tearful. 4. CT finding prostate hypertrophy, PSA 7 in 2010. Outpatient workup per patient's long term goals.  SignedLloyd Huger PGY-3 01/30/2012, 11:09 AM

## 2012-01-30 NOTE — Discharge Summary (Signed)
Seen and examined.  Discussed with Dr. Cristal Ford.  Agree with her management.  See my note of earlier today.

## 2012-01-30 NOTE — Progress Notes (Signed)
Subjective: No nausea/vomiting x ~ 24 hours.  Objective: Vital signs in last 24 hours: Temp:  [97.4 F (36.3 C)-98.4 F (36.9 C)] 98.4 F (36.9 C) (08/24 1418) Pulse Rate:  [61-85] 85  (08/24 1418) Resp:  [16] 16  (08/24 1418) BP: (90-120)/(53-74) 107/69 mmHg (08/24 1418) SpO2:  [93 %-95 %] 93 % (08/24 1418) Weight:  [61.462 kg (135 lb 8 oz)] 61.462 kg (135 lb 8 oz) (08/24 1418) Weight change:  Last BM Date: 01/30/12  PE: GEN:  NAD ABD:  Soft  Assessment:  1.  Nausea and vomiting.  Resolved.  Unclear etiology, with unremarkable CT and upper endoscopy.  Plan:  1.  OK for discharge today from GI perspective. 2.  Will need outpatient follow-up with my partner, Dr. Dorena Cookey, of [facility]. 3.  Will sign off; thank you for the consult.   Allen [name] 01/30/2012, 2:57 PM

## 2012-01-30 NOTE — Progress Notes (Signed)
Seen and examined.  Discussed with Dr. Cristal Ford.  Agree with her documentation and management.  Briefly, agree with DC.  He is eating more and no abd pain.  Controled by Zofran (and likely helped with mirtazapine.)  Daughter relates that he is crying for no apparent reason.  He lives with wife and daughter.  No recent changes.  Does have chronic back and Rt leg pain.  Several things to follow up as outpatient.

## 2012-02-01 ENCOUNTER — Encounter (HOSPITAL_COMMUNITY): Payer: Self-pay

## 2012-02-01 ENCOUNTER — Ambulatory Visit (HOSPITAL_COMMUNITY)
Admission: RE | Admit: 2012-02-01 | Discharge: 2012-02-01 | Disposition: A | Discharge status: Home / Self Care | Payer: Medicare Other | Source: Ambulatory Visit | Attending: Family Medicine | Admitting: Family Medicine

## 2012-02-01 ENCOUNTER — Telehealth: Payer: Self-pay | Admitting: Family Medicine

## 2012-02-01 ENCOUNTER — Encounter (HOSPITAL_COMMUNITY): Payer: Self-pay | Admitting: Gastroenterology

## 2012-02-01 DIAGNOSIS — R112 Nausea with vomiting, unspecified: Secondary | ICD-10-CM

## 2012-02-01 MED ORDER — TECHNETIUM TC 99M SULFUR COLLOID
2.0000 | Freq: Once | INTRAVENOUS | Status: AC | PRN
  Administered 2012-02-01: 2 via INTRAVENOUS

## 2012-02-01 NOTE — Telephone Encounter (Signed)
Ordered as follow up from hospitalization, Requested by Dr Madilyn Fireman.

## 2012-02-05 ENCOUNTER — Telehealth: Payer: Self-pay | Admitting: Family Medicine

## 2012-02-05 NOTE — Telephone Encounter (Signed)
Daughter in law is calling for results from test.

## 2012-02-05 NOTE — Telephone Encounter (Signed)
Called pt's dtr and informed of Dr.Briscoe's note. Lorenda Hatchet, Renato Battles

## 2012-02-05 NOTE — Telephone Encounter (Signed)
You can let her know gastric emptying test is normal.  Can discuss lab results with Dr. Sheffield Slider further

## 2012-02-05 NOTE — Telephone Encounter (Signed)
Fwd. To Dr.Briscoe for review. Lorenda Hatchet, Renato Battles

## 2012-02-09 ENCOUNTER — Other Ambulatory Visit (HOSPITAL_COMMUNITY): Payer: Medicare Other

## 2012-02-16 ENCOUNTER — Encounter: Payer: Self-pay | Admitting: Family Medicine

## 2012-02-16 ENCOUNTER — Ambulatory Visit (INDEPENDENT_AMBULATORY_CARE_PROVIDER_SITE_OTHER): Payer: Medicare Other | Admitting: Family Medicine

## 2012-02-16 VITALS — BP 144/80 | HR 63 | Ht 63.0 in | Wt 136.0 lb

## 2012-02-16 DIAGNOSIS — K219 Gastro-esophageal reflux disease without esophagitis: Secondary | ICD-10-CM

## 2012-02-16 DIAGNOSIS — N401 Enlarged prostate with lower urinary tract symptoms: Secondary | ICD-10-CM

## 2012-02-16 DIAGNOSIS — R112 Nausea with vomiting, unspecified: Secondary | ICD-10-CM | POA: Diagnosis not present

## 2012-02-16 NOTE — Progress Notes (Signed)
  Subjective:    Patient ID: Allen [name], male    DOB: 22-Jun-1918, 78 y.o.   MRN: [id number]  HPI Visit conducted in Spanish.  Has family members assisting in visit, as patient is quite hard of hearing.   Has been doing much better since discharge.  He has not been vomiting anymore.  Eating relatively well. Uses Ensure 2 times daily; taking Resource Breeze twice daily between meals. Having one to two BMs a day, soft and non-bloody.  Taking the metamucil regularly.   Reviewed medications since discharge.  Of note, patient reports that he did not have problems with urinary retention before starting tamsulosin one month ago; he has been stopped on this med without ill effect.  Family voices concerns about degree of pill burden, and interested in reducing as much as possible.   Patient denies cough or shortness of breath.  Denies chest pain.  Denies any feelings of depression, responds energetically that he feels very well. Reports some difficulty swallowingfood after chewing; has had a normal EGD with GI inpatient, and a normal gastric emptying study as outpatient.   Some pain along R hip/sciatic distribution; has been prescribed gabapentin 600mg  tablets to take 1-1/2 tabs twice daily and a third tablet at bedtime.  Currently he takes 1/2 tab twice a day.  Concerns about taking too much medicine, although he has not had an adverse effect with this med.   Reviewed patient labs and studies, including dc summary.  Discussion about repeatedly elevated PSA  And options for further workup.  Patient and family both agree that further workup is not their wish, given that he is asymptomatic and is back to his baseline.   Review of Systems see above.  Quit cigarettes over 25 yrs ago.      Objective:   Physical Exam  Well appearing, walks without assistance.  Gets to exam table anddown again similarly without assistance.  HEENT Neck supple. No cervical adenopathy. Very poor dentition, with  several broken molars.  COR Regular s1s2 PULM Clear bilat. NO wheezes or rales appreciated ABD Soft, nontender, nondistended. Present bowel sounds. No masses noted. No lower ext edema noted on exam.       Assessment & Plan:

## 2012-02-16 NOTE — Assessment & Plan Note (Signed)
Patient with diagnosis of BPH; he denies symptoms of retention even before his recent trial of tamsulosin, which he did not feel helped him.  He wishes to hold; I have instructed family to call if he is having symptoms of retention, and can be restarted.    I discussed the possible options for further workup of his elevated PSA, and the possibility of malignancy.  In reviewing his labs/studies, he has had a very normal CBC and alk phos; CT abd pelvis without suggestions of prostatic malignancy or metastatic disease.  Family and patient all agree that he desires no further workup at this time.

## 2012-02-16 NOTE — Patient Instructions (Addendum)
Fue un Research officer, trade union.   Sobre las medicinas:   GABAPENTIN (para dolor de los nervios en la cadera derecha): Puede aumentar a una tableta (600mg ) DOS VECES POR DIA; con el desayuno y el almuerzo.  Puede aumentarle aun mas (a TRES por dia) despues de una semana si no se le mejora los sintomas.  Descontinuamos la medicina para la prostata (Tamsulosin).  Si comienza con sintomas de no poder pasar bien la orina, por favor llame para poder reiniciarla de nuevo.  Las demas medicinas tal y Cathlean Sauer' tomandolas.  Vamos a volver a Automotive engineer tomografia del pecho en 3 a 6 meses (fin de este ano 2013).  Puse la orden, asi que puede [place] para Nov/Diciembre.  MAY CHANGE PCP TO DR Mauricio Po AT PATIENT REQUEST.

## 2012-02-16 NOTE — Assessment & Plan Note (Signed)
Recent N/V with weight loss, admitted to hospital and suspected of having PNA as cause.  He is doing much better, now tolerating food.  Poor dentition makes this more difficult.  Plan to address dental issues; puree or soft diet until then.    Due to abnormal lung bases on CT abd, to reimage chest with CT in 3-6 months.  Family agrees with this.

## 2012-02-22 ENCOUNTER — Encounter: Payer: Self-pay | Admitting: Internal Medicine

## 2012-02-23 ENCOUNTER — Ambulatory Visit: Payer: Medicare Other | Admitting: Internal Medicine

## 2012-02-26 ENCOUNTER — Encounter: Payer: Self-pay | Admitting: Family Medicine

## 2012-02-26 NOTE — Progress Notes (Signed)
  Subjective:    Patient ID: Allen [name], male    DOB: [birth year], 78 y.o.   MRN: [id number]  HPI    Review of Systems     Objective:   Physical Exam        Assessment & Plan:   Received refill authorization request for Mirtazapine 7.5mg  daily; patient had complained he was taking too much medicine in the past and this was stopped.  Will monitor for mood and weight at next visit before deciding whether to reinstate med.  Mirtazapine is cancelled from med list at this time.  Paula Compton, MD

## 2012-03-04 ENCOUNTER — Other Ambulatory Visit: Payer: Self-pay | Admitting: Family Medicine

## 2012-03-13 DIAGNOSIS — Z23 Encounter for immunization: Secondary | ICD-10-CM | POA: Diagnosis not present

## 2012-03-25 ENCOUNTER — Ambulatory Visit: Payer: Medicare Other

## 2012-04-29 ENCOUNTER — Other Ambulatory Visit: Payer: Self-pay | Admitting: Family Medicine

## 2012-05-24 ENCOUNTER — Other Ambulatory Visit: Payer: Self-pay | Admitting: Family Medicine

## 2012-06-24 ENCOUNTER — Other Ambulatory Visit: Payer: Self-pay | Admitting: Family Medicine

## 2012-06-29 ENCOUNTER — Encounter (HOSPITAL_COMMUNITY): Payer: Self-pay | Admitting: *Deleted

## 2012-06-29 ENCOUNTER — Emergency Department (HOSPITAL_COMMUNITY)
Admission: EM | Admit: 2012-06-29 | Discharge: 2012-06-29 | Disposition: A | Discharge status: Home / Self Care | Payer: Medicare Other | Attending: Emergency Medicine | Admitting: Emergency Medicine

## 2012-06-29 ENCOUNTER — Emergency Department (HOSPITAL_COMMUNITY): Payer: Medicare Other

## 2012-06-29 DIAGNOSIS — Z950 Presence of cardiac pacemaker: Secondary | ICD-10-CM | POA: Insufficient documentation

## 2012-06-29 DIAGNOSIS — Z8719 Personal history of other diseases of the digestive system: Secondary | ICD-10-CM | POA: Insufficient documentation

## 2012-06-29 DIAGNOSIS — Z8739 Personal history of other diseases of the musculoskeletal system and connective tissue: Secondary | ICD-10-CM | POA: Insufficient documentation

## 2012-06-29 DIAGNOSIS — K219 Gastro-esophageal reflux disease without esophagitis: Secondary | ICD-10-CM | POA: Insufficient documentation

## 2012-06-29 DIAGNOSIS — Z87891 Personal history of nicotine dependence: Secondary | ICD-10-CM | POA: Insufficient documentation

## 2012-06-29 DIAGNOSIS — Z792 Long term (current) use of antibiotics: Secondary | ICD-10-CM | POA: Diagnosis not present

## 2012-06-29 DIAGNOSIS — M549 Dorsalgia, unspecified: Secondary | ICD-10-CM

## 2012-06-29 DIAGNOSIS — Z79899 Other long term (current) drug therapy: Secondary | ICD-10-CM | POA: Insufficient documentation

## 2012-06-29 DIAGNOSIS — I10 Essential (primary) hypertension: Secondary | ICD-10-CM | POA: Insufficient documentation

## 2012-06-29 DIAGNOSIS — J9819 Other pulmonary collapse: Secondary | ICD-10-CM | POA: Diagnosis not present

## 2012-06-29 DIAGNOSIS — R112 Nausea with vomiting, unspecified: Secondary | ICD-10-CM

## 2012-06-29 LAB — CBC WITH DIFFERENTIAL/PLATELET
Eosinophils Absolute: 0.3 10*3/uL (ref 0.0–0.7)
Eosinophils Relative: 3 % (ref 0–5)
Hemoglobin: 15.1 g/dL (ref 13.0–17.0)
Lymphs Abs: 2.6 10*3/uL (ref 0.7–4.0)
MCH: 33.6 pg (ref 26.0–34.0)
MCHC: 36.6 g/dL — ABNORMAL HIGH (ref 30.0–36.0)
MCV: 91.8 fL (ref 78.0–100.0)
Monocytes Relative: 8 % (ref 3–12)
RBC: 4.5 MIL/uL (ref 4.22–5.81)

## 2012-06-29 LAB — POCT I-STAT, CHEM 8
Calcium, Ion: 1.25 mmol/L (ref 1.13–1.30)
Creatinine, Ser: 1.4 mg/dL — ABNORMAL HIGH (ref 0.50–1.35)
Hemoglobin: 15 g/dL (ref 13.0–17.0)
Sodium: 138 mEq/L (ref 135–145)
TCO2: 25 mmol/L (ref 0–100)

## 2012-06-29 MED ORDER — SODIUM CHLORIDE 0.9 % IV BOLUS (SEPSIS)
1000.0000 mL | Freq: Once | INTRAVENOUS | Status: AC
  Administered 2012-06-29: 1000 mL via INTRAVENOUS

## 2012-06-29 MED ORDER — ONDANSETRON HCL 4 MG PO TABS: 4.0000 mg | ORAL_TABLET | Freq: Four times a day (QID) | ORAL | Status: DC | PRN

## 2012-06-29 MED ORDER — ONDANSETRON HCL 4 MG/2ML IJ SOLN: 4.0000 mg | Freq: Once | INTRAMUSCULAR | Status: DC

## 2012-06-29 MED ORDER — ONDANSETRON HCL 4 MG/2ML IJ SOLN
4.0000 mg | Freq: Once | INTRAMUSCULAR | Status: AC
  Administered 2012-06-29: 4 mg via INTRAVENOUS
  Filled 2012-06-29: qty 2

## 2012-06-29 NOTE — ED Notes (Addendum)
Here with daughter. Daughter reports pt here for back pain. Developed back pain while vomiting earlier. Denies back pain at this time. C/o nausea now.  was seen by dentist today, had tooth extracted, started on abx and hydrocodone. Developed vomiting PTA. Vomited, coughed  & choked during triage. LS with crackles at this time. No obvious blood in emesis. Pt speaks spanish & wears hearing aids, daughter translating. SPO2 noted to be low.

## 2012-06-29 NOTE — ED Provider Notes (Signed)
History     CSN: [id number]  Arrival date & time 06/29/12  1948   First MD Initiated Contact with Patient 06/29/12 2111      Chief Complaint  Patient presents with  . Back Pain  . Emesis    (Consider location/radiation/quality/duration/timing/severity/associated sxs/prior treatment) Patient is a 79 y.o. male presenting with back pain and vomiting. The history is provided by the patient.  Back Pain   Emesis   He had been to the dentist earlier today and given a prescription for Vicodin. Shortly after taking it tonight, he vomited. He had complained of pain across his mid back that time but he vomited that. However, he states the pain is gone now. Currently, he denies all pain. He still is having some nausea. He did vomit after arrival in the ED as well. There's been no fever chills or sweats. He denies any abdominal pain.  Past Medical History  Diagnosis Date  . Pacemaker   . Hypertension   . GERD (gastroesophageal reflux disease)   . H/O hiatal hernia   . Arthritis     hands and feet    Past Surgical History  Procedure Date  . Testicle surgery 1933  . Inguinal hernia repair 1980  . Pacemaker placement 03/2009    syncope - bradycardia, and 2nd degree block   . Hernia repair   . Esophagogastroduodenoscopy 01/29/2012    Procedure: ESOPHAGOGASTRODUODENOSCOPY (EGD);  Surgeon: Barrie Folk, MD;  Location: [facility] ENDOSCOPY;  Service: Endoscopy;  Laterality: N/A;  Intrepreter scheduled 930-12.    History reviewed. No pertinent family history.  History  Substance Use Topics  . Smoking status: Former Games developer  . Smokeless tobacco: Not on file  . Alcohol Use: No      Review of Systems  Gastrointestinal: Positive for vomiting.  Musculoskeletal: Positive for back pain.  All other systems reviewed and are negative.    Allergies  Review of patient's allergies indicates no known allergies.  Home Medications   Current Outpatient Rx  Name  Route  Sig  Dispense  Refill  .  ACETAMINOPHEN ER 650 MG PO TBCR   Oral   Take 650 mg by mouth every 8 (eight) hours as needed. For pain         . AMLODIPINE BESYLATE 5 MG PO TABS      TAKE ONE TABLET BY MOUTH EVERY DAY FOR BLOOD PRESSURE   90 tablet   2   . AMOXICILLIN 500 MG PO TABS   Oral   Take 500 mg by mouth 4 (four) times daily.         . ASPIRIN EC 81 MG PO TBEC   Oral   Take 81 mg by mouth daily.           Marland Kitchen CALCIUM PO   Oral   Take 1 tablet by mouth daily.         . RESOURCE BREEZE PO LIQD   Oral   Take 1 Container by mouth 2 (two) times daily between meals.   12 Container   1   . FISH OIL OIL   Oral   Take 1 capsule by mouth 3 (three) times daily.          Marland Kitchen GABAPENTIN 600 MG PO TABS   Oral   Take 300-600 mg by mouth 3 (three) times daily. Take 1/2 a tablet 3 times a day. Take 1 tablet at bedtime.         Marland Kitchen HYDROCODONE-ACETAMINOPHEN 5-325 MG  PO TABS   Oral   Take 1 tablet by mouth 4 (four) times daily.         Marland Kitchen OMEPRAZOLE 20 MG PO CPDR      TAKE TWO CAPSULES BY MOUTH TWICE DAILY   180 capsule   2   . PRAVASTATIN SODIUM 10 MG PO TABS      TAKE ONE TABLET BY MOUTH EVERY DAY   90 tablet   2     BP 121/68  Pulse 84  Temp 97.9 F (36.6 C) (Oral)  Resp 16  SpO2 92%  Physical Exam  Nursing note and vitals reviewed.  79 year old male, resting comfortably and in no acute distress. Vital signs are normal. Oxygen saturation is 92%, which is normal. Head is normocephalic and atraumatic. PERRLA, EOMI. Oropharynx is clear. Neck is nontender and supple without adenopathy or JVD. Back is nontender and there is no CVA tenderness. Lungs are clear without rales, wheezes, or rhonchi. Chest is nontender. Heart has regular rate and rhythm without murmur. Abdomen is soft, flat, nontender without masses or hepatosplenomegaly and peristalsis is hypoactive. Extremities have no cyanosis or edema, full range of motion is present. Skin is warm and dry without rash. Neurologic:  Mental status is normal, cranial nerves are intact, there are no motor or sensory deficits.  ED Course  Procedures (including critical care time)  Results for orders placed during the hospital encounter of 06/29/12  CBC WITH DIFFERENTIAL      Component Value Range   WBC 9.6  4.0 - 10.5 K/uL   RBC 4.50  4.22 - 5.81 MIL/uL   Hemoglobin 15.1  13.0 - 17.0 g/dL   HCT 40.9  81.1 - 91.4 %   MCV 91.8  78.0 - 100.0 fL   MCH 33.6  26.0 - 34.0 pg   MCHC 36.6 (*) 30.0 - 36.0 g/dL   RDW 78.2  95.6 - 21.3 %   Platelets 239  150 - 400 K/uL   Neutrophils Relative 62  43 - 77 %   Neutro Abs 6.0  1.7 - 7.7 K/uL   Lymphocytes Relative 27  12 - 46 %   Lymphs Abs 2.6  0.7 - 4.0 K/uL   Monocytes Relative 8  3 - 12 %   Monocytes Absolute 0.8  0.1 - 1.0 K/uL   Eosinophils Relative 3  0 - 5 %   Eosinophils Absolute 0.3  0.0 - 0.7 K/uL   Basophils Relative 0  0 - 1 %   Basophils Absolute 0.0  0.0 - 0.1 K/uL  POCT I-STAT, CHEM 8      Component Value Range   Sodium 138  135 - 145 mEq/L   Potassium 3.8  3.5 - 5.1 mEq/L   Chloride 103  96 - 112 mEq/L   BUN 20  6 - 23 mg/dL   Creatinine, Ser 0.86 (*) 0.50 - 1.35 mg/dL   Glucose, Bld 578 (*) 70 - 99 mg/dL   Calcium, Ion 4.69  6.29 - 1.30 mmol/L   TCO2 25  0 - 100 mmol/L   Hemoglobin 15.0  13.0 - 17.0 g/dL   HCT 52.8  41.3 - 24.4 %   Dg Chest Portable 1 View  06/29/2012  *RADIOLOGY REPORT*  Clinical Data: Back pain.  PORTABLE CHEST - 1 VIEW  Comparison: 01/28/2012  Findings: Streaky densities at both lung bases are suggestive for atelectasis.  There is left cardiac pacemaker.  Heart size is normal.  No evidence for pulmonary  edema.  Trachea is midline.  IMPRESSION: Bibasilar densities are most compatible with atelectasis.   Original Report Authenticated By: Richarda Overlie, M.D.       1. Nausea & vomiting   2. Back pain       MDM  Nausea and vomiting which are probably secondary to hydrocodone. His prior records are reviewed and he does have a history  of an abdominal aortic aneurysm but it was very small. He has been hemodynamically stable in the ED and does not have a clinical picture of dissecting aneurysm. He'll be given IV hydration and IV ondansetron for nausea.  He feels better after above noted medication. He has had no recurrence of pain and no recurrence of nausea. He is sent with a prescription for ondansetron and advised to premedicate himself with ondansetron before taking hydrocodone again.      Dione Booze, MD 06/29/12 2328

## 2012-06-29 NOTE — ED Notes (Signed)
Per daughter at bedside she was called to living room by her mother because her father was complaining of central mid back pain after vomiting. Pt went to dentist this am and had two teeth removed, was prescribed vicodin and amoxi, took both around 230 today, first episode of vomiting around 730. Pt reports nausea still at this time. Pt denies abdominal or chest pain. Pt denies sob. O2 sats noted to be low during assessment, pt placed on O2 via [place]. Daughter states pt has a hx of smoking several years ago.

## 2012-08-18 ENCOUNTER — Other Ambulatory Visit: Payer: Self-pay | Admitting: Family Medicine

## 2012-12-23 ENCOUNTER — Ambulatory Visit (INDEPENDENT_AMBULATORY_CARE_PROVIDER_SITE_OTHER): Payer: Medicare Other | Admitting: Family Medicine

## 2012-12-23 ENCOUNTER — Encounter: Payer: Self-pay | Admitting: Family Medicine

## 2012-12-23 VITALS — BP 133/77 | HR 67 | Temp 98.0°F | Ht 63.0 in | Wt 139.2 lb

## 2012-12-23 DIAGNOSIS — R413 Other amnesia: Secondary | ICD-10-CM | POA: Diagnosis not present

## 2012-12-23 DIAGNOSIS — F0280 Dementia in other diseases classified elsewhere without behavioral disturbance: Secondary | ICD-10-CM

## 2012-12-23 DIAGNOSIS — Z9181 History of falling: Secondary | ICD-10-CM

## 2012-12-23 DIAGNOSIS — G2589 Other specified extrapyramidal and movement disorders: Secondary | ICD-10-CM

## 2012-12-23 DIAGNOSIS — R32 Unspecified urinary incontinence: Secondary | ICD-10-CM | POA: Diagnosis not present

## 2012-12-23 HISTORY — DX: Dementia in other diseases classified elsewhere, unspecified severity, without behavioral disturbance, psychotic disturbance, mood disturbance, and anxiety: F02.80

## 2012-12-23 LAB — POCT UA - MICROSCOPIC ONLY

## 2012-12-23 LAB — CBC
MCHC: 35.7 g/dL (ref 30.0–36.0)
MCV: 90.2 fL (ref 78.0–100.0)
Platelets: 264 10*3/uL (ref 150–400)
RDW: 13.8 % (ref 11.5–15.5)
WBC: 5.7 10*3/uL (ref 4.0–10.5)

## 2012-12-23 LAB — COMPREHENSIVE METABOLIC PANEL
ALT: 10 U/L (ref 0–53)
AST: 16 U/L (ref 0–37)
Alkaline Phosphatase: 73 U/L (ref 39–117)
BUN: 15 mg/dL (ref 6–23)
Calcium: 9.3 mg/dL (ref 8.4–10.5)
Chloride: 97 mEq/L (ref 96–112)
Creat: 1.35 mg/dL (ref 0.50–1.35)
Total Bilirubin: 0.5 mg/dL (ref 0.3–1.2)

## 2012-12-23 LAB — POCT URINALYSIS DIPSTICK
Bilirubin, UA: NEGATIVE
Blood, UA: NEGATIVE
Glucose, UA: NEGATIVE
Ketones, UA: NEGATIVE
Spec Grav, UA: 1.02

## 2012-12-23 MED ORDER — GABAPENTIN 600 MG PO TABS: ORAL_TABLET | ORAL | Status: DC

## 2012-12-23 MED ORDER — AMLODIPINE BESYLATE 5 MG PO TABS: ORAL_TABLET | ORAL | Status: DC

## 2012-12-23 MED ORDER — OMEPRAZOLE 20 MG PO CPDR: DELAYED_RELEASE_CAPSULE | ORAL | Status: DC

## 2012-12-23 MED ORDER — PRAVASTATIN SODIUM 10 MG PO TABS: ORAL_TABLET | ORAL | Status: DC

## 2012-12-23 NOTE — Assessment & Plan Note (Signed)
Patient requires assist for transfer in room; daughter reports that he is at risk for falls at home, despite not having fallen yet.  Will order home health PT evaluation to the home to assess house and patient for consideration of assistive device (walker, cane). Home accommodations to prevent fall.

## 2012-12-23 NOTE — Patient Instructions (Addendum)
Fue un Research officer, trade union.  Estamos chequeando varios laboratorios para la memoria, tambien para la incontinencia [place].  Mande' las recetas que ya esta' tomando; la presion esta' bajo control y no hay que hacer ningun cambio a las medicinas.   Quiero volver a Acupuncturist de 4 a 6 semanas.  FOLLOW UP WITH DR Mauricio Po 4 TO 6 WEEKS, MEMORY LOSS

## 2012-12-23 NOTE — Progress Notes (Signed)
  Subjective:    Patient ID: Allen [name], male    DOB: 12-16-1918, 79 y.o.   MRN: [id number]  HPI Visit in Spanish; daughter Doneen Poisson is present and offering history.  Patient feels well;continues to use hearing aid for hearing impairment.  Was in ED on Jan 22 for abd pain believed to be secondary to opioids taken after a dental procedure.  No abd pain presently.   Daughter reports that patient has been more forgetful of recent events; still remembers distant events very clearly.  Short-term memory (unable to figure out how old family members are now).  He lives with his wife and daughter.  Walks without assistance, but daughter concerned about possible falls.  No falls recently.  Dresses and bathes himself, toilets himself.  Wife or daughter prepare his food.  He does not drive.   Denies headache, fevers, chills, weight loss, chest pain, dyspnea, cough.   Review of Systems     Objective:   Physical Exam Well appearing, no apparent distress HEENT neck supple, no cervical nodes. No thyroid nodules or tenderness. Moist mucus membranes COR Regular S1S2, no extra sounds PULM Clear bilaterally, no rales or wheezes NEURO: Able to get to exam table with 1-pt assistance. Minor rigidity with passive flex/extension of elbows.  Brachioradialis and biceps reflexes 1+ bilat. Hand flex/extension grossly intact and symmetric.  Mental status: Oriented to "[facility]", "Doctor's office".  Cannot tell me the month, day of week, or day of the month.  Reports year as "14".  Reports that he watches the news but cannot name a current event.  Immediate and 5-minute recall of three objects is 2 (immediate) and 2 (5-min).        Assessment & Plan:

## 2012-12-23 NOTE — Assessment & Plan Note (Signed)
Modified mental status exam today with notable deficiencies.  Will begin with lab workup to include TSH, RPR, B12 folate.  Consider Aricept at next visit after labs are back.

## 2012-12-23 NOTE — Assessment & Plan Note (Signed)
On gabapentin, which has controlled his leg pain and RLS. Continue gabapentin.

## 2012-12-25 LAB — URINE CULTURE
Colony Count: NO GROWTH
Organism ID, Bacteria: NO GROWTH

## 2012-12-26 ENCOUNTER — Telehealth: Payer: Self-pay | Admitting: Family Medicine

## 2012-12-26 DIAGNOSIS — R7989 Other specified abnormal findings of blood chemistry: Secondary | ICD-10-CM

## 2012-12-26 NOTE — Telephone Encounter (Signed)
[phone] Called to report results to patient/ daughter Allen [name], left message for him to have repeat nonfasting labs for TSH and free T4 before next appt with me on Aug 8th. Message left in Spanish. JB

## 2013-01-03 ENCOUNTER — Other Ambulatory Visit: Payer: Medicare Other

## 2013-01-03 DIAGNOSIS — H04129 Dry eye syndrome of unspecified lacrimal gland: Secondary | ICD-10-CM | POA: Diagnosis not present

## 2013-01-03 DIAGNOSIS — R946 Abnormal results of thyroid function studies: Secondary | ICD-10-CM | POA: Diagnosis not present

## 2013-01-03 DIAGNOSIS — R7989 Other specified abnormal findings of blood chemistry: Secondary | ICD-10-CM

## 2013-01-03 DIAGNOSIS — H353190 Nonexudative age-related macular degeneration, unspecified eye, stage unspecified: Secondary | ICD-10-CM | POA: Diagnosis not present

## 2013-01-03 NOTE — Progress Notes (Signed)
Drew: TSH & Free T4   [place], MLS

## 2013-01-04 LAB — T4, FREE: Free T4: 1.01 ng/dL (ref 0.80–1.80)

## 2013-01-05 ENCOUNTER — Encounter: Payer: Self-pay | Admitting: Family Medicine

## 2013-01-12 ENCOUNTER — Telehealth: Payer: Self-pay | Admitting: Family Medicine

## 2013-01-12 NOTE — Telephone Encounter (Signed)
The daughter is calling for the results of her fathers blood work.

## 2013-01-12 NOTE — Telephone Encounter (Signed)
Will fwd to MD for review.  Nina Hoar L, CMA  

## 2013-01-12 NOTE — Telephone Encounter (Signed)
Spoke with patient's daughter about his TSH and LT4. No diagnosis of hypothyroidism. JB

## 2013-01-13 ENCOUNTER — Ambulatory Visit: Payer: Medicare Other | Admitting: Family Medicine

## 2013-01-20 ENCOUNTER — Ambulatory Visit: Payer: Medicare Other | Admitting: Family Medicine

## 2013-02-17 ENCOUNTER — Encounter: Payer: Self-pay | Admitting: Family Medicine

## 2013-02-17 ENCOUNTER — Ambulatory Visit (INDEPENDENT_AMBULATORY_CARE_PROVIDER_SITE_OTHER): Payer: Medicare Other | Admitting: Family Medicine

## 2013-02-17 VITALS — BP 138/74 | HR 63 | Ht 63.0 in | Wt 137.4 lb

## 2013-02-17 DIAGNOSIS — K219 Gastro-esophageal reflux disease without esophagitis: Secondary | ICD-10-CM

## 2013-02-17 DIAGNOSIS — I10 Essential (primary) hypertension: Secondary | ICD-10-CM | POA: Diagnosis not present

## 2013-02-17 DIAGNOSIS — I441 Atrioventricular block, second degree: Secondary | ICD-10-CM

## 2013-02-17 DIAGNOSIS — Z23 Encounter for immunization: Secondary | ICD-10-CM

## 2013-02-17 MED ORDER — PRAVASTATIN SODIUM 10 MG PO TABS: ORAL_TABLET | ORAL | Status: DC

## 2013-02-17 MED ORDER — OMEPRAZOLE 20 MG PO CPDR: DELAYED_RELEASE_CAPSULE | ORAL | Status: DC

## 2013-02-17 NOTE — Patient Instructions (Addendum)
Fue un Research officer, trade union; me alegro que la presion arterial se ha mejorado y que ya se siente mejor.   Estamos suspendiendo la amlodipine por completo, ya que parece que no la necesita.   Elfredia Nevins que haga una cita con el cardiologo (Dr Johney Frame) para interrogacion del marcapasos que se implanto' el 08 de [place] del 2010.  Revision en 6 meses, o antes si se hacenecesario.  FOLLOW UP WITH DR Mauricio Po IN 6 MONTHS.   PLEASE GIVE CONTACT INFORMATION FOR DR Hillis Range, [place] [facility]; PATIENT FOR FOLLOW UP OF PACEMAKER WITH DR Johney Frame.

## 2013-02-17 NOTE — Assessment & Plan Note (Signed)
Patient for follow up with Dr Johney Frame for his pacemaker, to consider interrogating for any events that might coincide with recent bout of hypotension, which has resolved off amlodipine.

## 2013-02-17 NOTE — Assessment & Plan Note (Signed)
Improved while on PPI.  To refill today.

## 2013-02-17 NOTE — Assessment & Plan Note (Signed)
Appears to have become hypotensive on amlodipine, has normalized now that the med has been held for 1 week. Will continue to hold.  His weight is relatively unchanged; between his daughter and me, we cannot identify any lifestyle or dietary/med changes that would account for his drop in blood pressure.  He is without complaints at the present time.  Continue with ASA and to hold his amlodipine.

## 2013-02-17 NOTE — Progress Notes (Signed)
  Subjective:    Patient ID: Allen [name], male    DOB: [birth year], 79 y.o.   MRN: [id number]  HPI  Visit conducted in Spanish.  Primary historian is daughter.  Brought in by daughter for follow up of his blood pressure. Daughter, who is his primary caregiver, reports that he had some complaints of feeling dizzy last week; she took his BP and found his SBP to be in the 90s.  She has been holding his amlodipine since then, and she noticed a gradual rise in his BP to the low-100s to where it is today (138/74).  He reports to me that he is feeling well, no longer dizzy and without complaints. Of note, no reported chest pain or palpitations during this episode.  No dyspnea noted.  No headaches.   Patient had a Biotronik pacer placed on 03/25/2009, was last seen by Dr Johney Frame for his pacer on 03/13/2010 by records in [place].  Daughter asks about follow up.   Continues to tolerate his statin, aspirin.  Refills on omeprazole, which helps prevent his GERD.    Review of Systems     Objective:   Physical Exam Well appearing, no apparent distress HEENT Neck supple. No cervical adenopathy. Thyroid supple.  COR Regular S1S2, no extra sounds PULM Clear bilaterally.        Assessment & Plan:

## 2013-03-08 ENCOUNTER — Other Ambulatory Visit: Payer: Self-pay | Admitting: Family Medicine

## 2013-03-08 MED ORDER — OMEPRAZOLE 20 MG PO CPDR: DELAYED_RELEASE_CAPSULE | ORAL | Status: DC

## 2013-03-08 MED ORDER — GABAPENTIN 600 MG PO TABS: ORAL_TABLET | ORAL | Status: DC

## 2013-03-08 MED ORDER — PRAVASTATIN SODIUM 10 MG PO TABS: ORAL_TABLET | ORAL | Status: DC

## 2013-03-09 ENCOUNTER — Ambulatory Visit (INDEPENDENT_AMBULATORY_CARE_PROVIDER_SITE_OTHER): Payer: Medicare Other | Admitting: Family Medicine

## 2013-03-09 ENCOUNTER — Encounter: Payer: Self-pay | Admitting: Family Medicine

## 2013-03-09 VITALS — BP 157/94 | HR 73 | Temp 98.1°F | Ht 63.0 in | Wt 139.0 lb

## 2013-03-09 DIAGNOSIS — R300 Dysuria: Secondary | ICD-10-CM | POA: Diagnosis not present

## 2013-03-09 DIAGNOSIS — N390 Urinary tract infection, site not specified: Secondary | ICD-10-CM

## 2013-03-09 LAB — POCT UA - MICROSCOPIC ONLY

## 2013-03-09 LAB — POCT URINALYSIS DIPSTICK
Bilirubin, UA: NEGATIVE
Glucose, UA: NEGATIVE
Nitrite, UA: NEGATIVE
Spec Grav, UA: 1.02
Urobilinogen, UA: 0.2

## 2013-03-09 MED ORDER — CIPROFLOXACIN HCL 500 MG PO TABS: 500.0000 mg | ORAL_TABLET | Freq: Two times a day (BID) | ORAL | Status: DC

## 2013-03-09 NOTE — Assessment & Plan Note (Signed)
Complicated as he is male, no signs of systemic involvement Interestingly has rods and cocci in chains on Micro Treat with 500 mg cipro BID X 7 days.  Discussed reasons to seek emergency care, including but not limited to, urinary retention, and inability to maintain his hydration.

## 2013-03-09 NOTE — Patient Instructions (Signed)
It was great meeting you!  Infeccin urinaria  (Urinary Tract Infection)  La infeccin urinaria puede ocurrir en Corporate treasurer del tracto urinario. El tracto urinario es un sistema de drenaje del cuerpo por el que se eliminan los desechos y el exceso de [place]. El tracto urinario est formado por dos riones, dos urteres, la vejiga y Engineer, mining. Los riones son rganos que tienen forma de frijol. Cada rin tiene aproximadamente el tamao del puo. Estn situados debajo de las [place], uno a cada lado de la columna vertebral CAUSAS  La causa de la infeccin son los microbios, que son organismos microscpicos, que incluyen hongos, virus, y bacterias. Estos organismos son tan pequeos que slo pueden verse a travs del microscopio. Las bacterias son los microorganismos que ms comnmente causan infecciones urinarias.  SNTOMAS  Los sntomas pueden variar segn la edad y el sexo del paciente y por la ubicacin de la infeccin. Los sntomas en las mujeres jvenes incluyen la necesidad frecuente e intensa de orinar y una sensacin dolorosa de ardor en la vejiga o en la uretra durante la miccin. Las mujeres y los hombres mayores podrn sentir cansancio, temblores y debilidad y Futures trader musculares y Engineer, mining abdominal. Si tiene [place], puede significar que la infeccin est en los riones. Otros sntomas son dolor en la espalda o en los lados debajo de las [place], nuseas y vmitos.  DIAGNSTICO  Para diagnosticar una infeccin urinaria, el mdico le preguntar acerca de sus sntomas. [employer] una [place]. La muestra de orina se analiza para Engineer, manufacturing bacterias y glbulos blancos de Risk manager. Los glbulos blancos se forman en el organismo para ayudar a Artist las infecciones.  TRATAMIENTO  Por lo general, las infecciones urinarias pueden tratarse con medicamentos. Debido a que la [employer] de las infecciones son causadas por bacterias, por lo general pueden tratarse con  antibiticos. La eleccin del antibitico y la duracin del tratamiento depender de sus sntomas y el tipo de bacteria causante de la infeccin.  INSTRUCCIONES PARA EL CUIDADO EN EL HOGAR   Si le recetaron antibiticos, tmelos exactamente como su mdico le indique. Termine el medicamento aunque se sienta mejor despus de haber tomado slo algunos.  Beba gran cantidad de lquido para mantener la orina de tono claro o color amarillo plido.  Evite la cafena, el t y las [address]. Estas sustancias irritan la vejiga.  Vaciar la vejiga con frecuencia. Evite retener la orina durante largos perodos.  Vace la vejiga antes y despus de Management consultant.  Despus de mover el intestino, las mujeres deben higienizarse la regin perineal desde adelante hacia atrs. Use slo un papel tissue por vez. SOLICITE ATENCIN MDICA SI:   Siente dolor en la espalda.  Le sube la fiebre.  Los sntomas no mejoran luego de [address]. SOLICITE ATENCIN MDICA DE INMEDIATO SI:   Siente dolor intenso en la espalda o en la zona inferior del abdomen.  Comienza a sentir escalofros.  Tiene nuseas o vmitos.  Tiene una sensacin continua de quemazn o molestias al [employer]. ASEGRESE DE QUE:   Comprende estas instrucciones.  Controlar su enfermedad.  Solicitar ayuda de inmediato si no mejora o empeora. Document Released: 03/04/2005 Document Revised: 02/17/2012 [facility] Patient Information 2014 [place], [place].

## 2013-03-09 NOTE — Progress Notes (Signed)
  Subjective:    Patient ID: Allen [name], male    DOB: June 12, 1918, 79 y.o.   MRN: [id number]  HPI Patient presents for same day appointment for dysuria  States he's had dysuria for about 2-3 days. Denies fevers, chills, sweats, suprapubic pain, back pain, decreased appetite, and dizziness. Also notes that his urine flow seems slow, and mentions that he's been on medicine for BPH in the past.   He feels that he may be herniating more frequently than usual but states that the volume is the same as usual each time he urinates. Denies any difficulty with his flow currently (other than slow flow).   He also denies penile discharge and states that he's never had this before.   Review of Systems Per HPI    Objective:   Physical Exam  Gen: NAD, alert, cooperative with exam HEENT: NCAT CV: RRR, good S1/S2, no murmur Resp: CTABL, no wheezes, non-labored Abd: SNTND, BS present, no guarding or organomegaly, no CVA tenderness Ext: No edema, warm Neuro: Alert and oriented, No gross deficits     Assessment & Plan:  See problem specific a/p

## 2013-03-10 ENCOUNTER — Inpatient Hospital Stay (HOSPITAL_COMMUNITY)
Admission: EM | Admit: 2013-03-10 | Discharge: 2013-03-13 | DRG: 689 | Disposition: A | Discharge status: Home / Self Care | Payer: Medicare Other | Attending: Family Medicine | Admitting: Family Medicine

## 2013-03-10 ENCOUNTER — Emergency Department (HOSPITAL_COMMUNITY): Payer: Medicare Other

## 2013-03-10 ENCOUNTER — Other Ambulatory Visit: Payer: Self-pay

## 2013-03-10 ENCOUNTER — Encounter (HOSPITAL_COMMUNITY): Payer: Self-pay | Admitting: Physical Medicine and Rehabilitation

## 2013-03-10 DIAGNOSIS — R42 Dizziness and giddiness: Secondary | ICD-10-CM | POA: Diagnosis present

## 2013-03-10 DIAGNOSIS — N390 Urinary tract infection, site not specified: Principal | ICD-10-CM | POA: Diagnosis present

## 2013-03-10 DIAGNOSIS — G2581 Restless legs syndrome: Secondary | ICD-10-CM | POA: Diagnosis present

## 2013-03-10 DIAGNOSIS — I10 Essential (primary) hypertension: Secondary | ICD-10-CM | POA: Diagnosis not present

## 2013-03-10 DIAGNOSIS — K5900 Constipation, unspecified: Secondary | ICD-10-CM | POA: Diagnosis present

## 2013-03-10 DIAGNOSIS — N401 Enlarged prostate with lower urinary tract symptoms: Secondary | ICD-10-CM | POA: Diagnosis not present

## 2013-03-10 DIAGNOSIS — J189 Pneumonia, unspecified organism: Secondary | ICD-10-CM | POA: Diagnosis present

## 2013-03-10 DIAGNOSIS — E871 Hypo-osmolality and hyponatremia: Secondary | ICD-10-CM | POA: Diagnosis not present

## 2013-03-10 DIAGNOSIS — Z7982 Long term (current) use of aspirin: Secondary | ICD-10-CM

## 2013-03-10 DIAGNOSIS — B952 Enterococcus as the cause of diseases classified elsewhere: Secondary | ICD-10-CM | POA: Diagnosis present

## 2013-03-10 DIAGNOSIS — K219 Gastro-esophageal reflux disease without esophagitis: Secondary | ICD-10-CM | POA: Diagnosis not present

## 2013-03-10 DIAGNOSIS — Z79899 Other long term (current) drug therapy: Secondary | ICD-10-CM | POA: Diagnosis not present

## 2013-03-10 DIAGNOSIS — E785 Hyperlipidemia, unspecified: Secondary | ICD-10-CM | POA: Diagnosis not present

## 2013-03-10 DIAGNOSIS — R079 Chest pain, unspecified: Secondary | ICD-10-CM | POA: Diagnosis not present

## 2013-03-10 DIAGNOSIS — Z950 Presence of cardiac pacemaker: Secondary | ICD-10-CM

## 2013-03-10 DIAGNOSIS — R339 Retention of urine, unspecified: Secondary | ICD-10-CM

## 2013-03-10 DIAGNOSIS — R55 Syncope and collapse: Secondary | ICD-10-CM

## 2013-03-10 DIAGNOSIS — Z87891 Personal history of nicotine dependence: Secondary | ICD-10-CM

## 2013-03-10 DIAGNOSIS — R569 Unspecified convulsions: Secondary | ICD-10-CM | POA: Diagnosis present

## 2013-03-10 DIAGNOSIS — N138 Other obstructive and reflux uropathy: Secondary | ICD-10-CM | POA: Diagnosis present

## 2013-03-10 LAB — POCT I-STAT, CHEM 8
BUN: 11 mg/dL (ref 6–23)
Calcium, Ion: 1.11 mmol/L — ABNORMAL LOW (ref 1.13–1.30)
Creatinine, Ser: 1.4 mg/dL — ABNORMAL HIGH (ref 0.50–1.35)
Glucose, Bld: 150 mg/dL — ABNORMAL HIGH (ref 70–99)
TCO2: 22 mmol/L (ref 0–100)

## 2013-03-10 LAB — CBC WITH DIFFERENTIAL/PLATELET
Eosinophils Absolute: 0 10*3/uL (ref 0.0–0.7)
Eosinophils Relative: 0 % (ref 0–5)
HCT: 36 % — ABNORMAL LOW (ref 39.0–52.0)
Lymphocytes Relative: 5 % — ABNORMAL LOW (ref 12–46)
Lymphs Abs: 0.7 10*3/uL (ref 0.7–4.0)
MCH: 32.6 pg (ref 26.0–34.0)
MCV: 88.9 fL (ref 78.0–100.0)
Monocytes Absolute: 0.7 10*3/uL (ref 0.1–1.0)
Monocytes Relative: 5 % (ref 3–12)
RBC: 4.05 MIL/uL — ABNORMAL LOW (ref 4.22–5.81)
WBC: 14.1 10*3/uL — ABNORMAL HIGH (ref 4.0–10.5)

## 2013-03-10 LAB — URINE MICROSCOPIC-ADD ON

## 2013-03-10 LAB — URINALYSIS, ROUTINE W REFLEX MICROSCOPIC
Glucose, UA: NEGATIVE mg/dL
Hgb urine dipstick: NEGATIVE
Specific Gravity, Urine: 1.031 — ABNORMAL HIGH (ref 1.005–1.030)

## 2013-03-10 LAB — POCT I-STAT TROPONIN I: Troponin i, poc: 0 ng/mL (ref 0.00–0.08)

## 2013-03-10 MED ORDER — CALCIUM CARBONATE-VITAMIN D 500-200 MG-UNIT PO TABS
1.0000 | ORAL_TABLET | Freq: Every day | ORAL | Status: DC
  Administered 2013-03-10 – 2013-03-13 (×4): 1 via ORAL
  Filled 2013-03-10 (×4): qty 1

## 2013-03-10 MED ORDER — PANTOPRAZOLE SODIUM 40 MG PO TBEC
40.0000 mg | DELAYED_RELEASE_TABLET | Freq: Every day | ORAL | Status: DC
  Administered 2013-03-11 – 2013-03-13 (×3): 40 mg via ORAL
  Filled 2013-03-10 (×3): qty 1

## 2013-03-10 MED ORDER — ASPIRIN EC 81 MG PO TBEC
81.0000 mg | DELAYED_RELEASE_TABLET | Freq: Every day | ORAL | Status: DC
  Administered 2013-03-10 – 2013-03-13 (×4): 81 mg via ORAL
  Filled 2013-03-10 (×4): qty 1

## 2013-03-10 MED ORDER — POLYETHYLENE GLYCOL 3350 17 G PO PACK
17.0000 g | PACK | Freq: Every day | ORAL | Status: DC | PRN
  Filled 2013-03-10: qty 1

## 2013-03-10 MED ORDER — VANCOMYCIN HCL IN DEXTROSE 1-5 GM/200ML-% IV SOLN
1000.0000 mg | Freq: Once | INTRAVENOUS | Status: AC
  Administered 2013-03-10: 1000 mg via INTRAVENOUS
  Filled 2013-03-10: qty 200

## 2013-03-10 MED ORDER — DEXTROSE 5 % IV SOLN
1.0000 g | INTRAVENOUS | Status: DC
  Administered 2013-03-11 – 2013-03-13 (×3): 1 g via INTRAVENOUS
  Filled 2013-03-10 (×4): qty 10

## 2013-03-10 MED ORDER — ACETAMINOPHEN 650 MG RE SUPP: 325.0000 mg | Freq: Four times a day (QID) | RECTAL | Status: DC | PRN

## 2013-03-10 MED ORDER — BOOST / RESOURCE BREEZE PO LIQD
1.0000 | Freq: Two times a day (BID) | ORAL | Status: DC
  Administered 2013-03-11 – 2013-03-13 (×4): 1 via ORAL

## 2013-03-10 MED ORDER — ENOXAPARIN SODIUM 30 MG/0.3ML ~~LOC~~ SOLN
30.0000 mg | SUBCUTANEOUS | Status: DC
  Administered 2013-03-10: 30 mg via SUBCUTANEOUS
  Filled 2013-03-10 (×2): qty 0.3

## 2013-03-10 MED ORDER — SODIUM CHLORIDE 0.9 % IV SOLN
Freq: Once | INTRAVENOUS | Status: AC
  Administered 2013-03-10: 15:00:00 via INTRAVENOUS

## 2013-03-10 MED ORDER — AZITHROMYCIN 500 MG PO TABS
500.0000 mg | ORAL_TABLET | Freq: Every day | ORAL | Status: AC
  Administered 2013-03-10: 500 mg via ORAL
  Filled 2013-03-10: qty 1

## 2013-03-10 MED ORDER — SIMVASTATIN 5 MG PO TABS
5.0000 mg | ORAL_TABLET | Freq: Every day | ORAL | Status: DC
  Administered 2013-03-10 – 2013-03-12 (×3): 5 mg via ORAL
  Filled 2013-03-10 (×4): qty 1

## 2013-03-10 MED ORDER — SODIUM CHLORIDE 0.9 % IV SOLN
INTRAVENOUS | Status: DC
  Administered 2013-03-10: 20:00:00 via INTRAVENOUS
  Administered 2013-03-11: 13:00:00 10 mL/h via INTRAVENOUS
  Administered 2013-03-11: 05:00:00 via INTRAVENOUS

## 2013-03-10 MED ORDER — DEXTROSE 5 % IV SOLN
1.0000 g | Freq: Once | INTRAVENOUS | Status: AC
  Administered 2013-03-10: 1 g via INTRAVENOUS
  Filled 2013-03-10: qty 10

## 2013-03-10 MED ORDER — SODIUM CHLORIDE 0.9 % IV SOLN
Freq: Once | INTRAVENOUS | Status: AC
  Administered 2013-03-10: 12:00:00 via INTRAVENOUS

## 2013-03-10 MED ORDER — DOCUSATE SODIUM 100 MG PO CAPS
100.0000 mg | ORAL_CAPSULE | Freq: Two times a day (BID) | ORAL | Status: DC
Start: 2013-03-10 — End: 2013-03-13
  Administered 2013-03-10 – 2013-03-13 (×6): 100 mg via ORAL
  Filled 2013-03-10 (×7): qty 1

## 2013-03-10 MED ORDER — SODIUM CHLORIDE 0.9 % IV BOLUS (SEPSIS)
250.0000 mL | Freq: Once | INTRAVENOUS | Status: AC
  Administered 2013-03-10: 250 mL via INTRAVENOUS

## 2013-03-10 MED ORDER — AZITHROMYCIN 250 MG PO TABS
250.0000 mg | ORAL_TABLET | Freq: Every day | ORAL | Status: DC
  Administered 2013-03-11 – 2013-03-12 (×2): 250 mg via ORAL
  Filled 2013-03-10 (×2): qty 1

## 2013-03-10 MED ORDER — OMEGA-3-ACID ETHYL ESTERS 1 G PO CAPS
1.0000 | ORAL_CAPSULE | Freq: Two times a day (BID) | ORAL | Status: DC
  Administered 2013-03-10 – 2013-03-13 (×6): 1 g via ORAL
  Filled 2013-03-10 (×7): qty 1

## 2013-03-10 MED ORDER — SODIUM CHLORIDE 0.9 % IJ SOLN
3.0000 mL | Freq: Two times a day (BID) | INTRAMUSCULAR | Status: DC
  Administered 2013-03-11 – 2013-03-13 (×4): 3 mL via INTRAVENOUS

## 2013-03-10 MED ORDER — ACETAMINOPHEN 325 MG PO TABS
325.0000 mg | ORAL_TABLET | Freq: Four times a day (QID) | ORAL | Status: DC | PRN
  Administered 2013-03-11: 325 mg via ORAL
  Filled 2013-03-10: qty 1

## 2013-03-10 NOTE — Progress Notes (Signed)
NURSING PROGRESS NOTE  Allen [name] [id number] Admission Data: 03/10/2013 7:54 PM Attending Provider: Janit Pagan, MD WUJ:[id number],YNWGN O, MD Code Status: Full  Allen [name] is a 79 y.o. male patient admitted from ED:  -No acute distress noted.  -No complaints of shortness of breath.  -No complaints of chest pain.   Cardiac Monitoring: Box # 16 in place. Cardiac monitor yields:paced.  Blood pressure 132/78, pulse 67, temperature 98.6 F (37 C), temperature source Oral, resp. rate 16, height 5\' 4"  (1.626 m), weight 63.095 kg (139 lb 1.6 oz), SpO2 92.00%.   IV Fluids:  IV in place, occlusive dsg intact without redness, IV cath forearm right, condition patent and no redness normal saline.   Allergies:  Review of patient's allergies indicates no known allergies.  Past Medical History:   has a past medical history of Pacemaker; Hypertension; GERD (gastroesophageal reflux disease); H/O hiatal hernia; and Arthritis.  Past Surgical History:   has past surgical history that includes Testicle surgery (1933); Inguinal hernia repair (1980); pacemaker placement (03/2009); Hernia repair; and Esophagogastroduodenoscopy (01/29/2012).  Social History:   reports that he has quit smoking. He does not have any smokeless tobacco history on file. He reports that he does not drink alcohol or use illicit drugs.  Skin: intact  Patient/Family orientated to room. Information packet given to patient/family. Admission inpatient armband information verified with patient/family to include name and date of birth and placed on patient arm. Side rails up x 2, fall assessment and education completed with patient/family. Patient/family able to verbalize understanding of risk associated with falls and verbalized understanding to call for assistance before getting out of bed. Call light within reach. Patient/family able to voice and demonstrate understanding of unit orientation instructions.     Will continue to evaluate and treat per MD orders.

## 2013-03-10 NOTE — H&P (Signed)
FMTS Attending Admission Note: Allen Ferrie,MD I  have seen and examined this patient, reviewed their chart. I have discussed this patient with the resident. I agree with the resident's findings, assessment and care plan.  Patient present with dizziness and weakness for the past few weeks as well as dysuria for which he was seen at the clinic yesterday. His UA checked at the clinic was suspicious for infection,patient was prescribed A/B but is yet to pick up his prescription. He denies blood in his urine,but urine darker,no fever.He denies cough. For his dizziness there is no known trigger,there is associated body weakness,he denies fall. No change in his hearing. With my limited spanish speaking I could not obtain much detailed information.  Filed Vitals:   03/10/13 1745 03/10/13 1750 03/10/13 1755 03/10/13 2210  BP: 123/70 136/80 132/78 113/71  Pulse: 63 72 67 63  Temp:    98 F (36.7 C)  TempSrc:    Oral  Resp:    18  Height: 5\' 4"  (1.626 m)     Weight: 139 lb 1.6 oz (63.095 kg)     SpO2:    94%   Exam: Gen: Pleasant elderly male in no distress,call in bed. HEENT: [place]/AT,PERRLSA,EOMI. Resp: Air entry equal B/L with mild b/l crackles more on right,no wheezing. Heart: S1 S2 normal,RRR,no murmurs. Abd: Benign,no CVT. Ext: No edema.  A/P: 79 y/o male with: 1. UTI (COmplicated with dizziness in a male elderly patient).     Urine culture obtained and pending.     I agree with A/B coverage.     Increase hydration.  2. Community Acquired Pneumonia:     Right LL on CXR.     Blood culture recommended.     Continue Ceftriaxone and Zithromax.     Monitor O2 sat and [place] O2 as needed.  3. Hyponatremia:      Seem a little dehydrated,hence unlikely due to fluid overload.      Monitor level,recheck Bmet in AM.  4. Dizziness: Likely related to complicated UTI     Symptom improved.     Monitor vitals and obtain orthostatic BP measurement.     Fall precaution recommended,would benefit  from PT for his weakness.     Consider CT head if recurrent and persistent.

## 2013-03-10 NOTE — ED Notes (Addendum)
Pt presents to department for evaluation of generalized weakness and dizziness. Was seen by PCP yesterday, family states "kidney issues" and was prescribed antibiotics. pt states pain with urination. Pt is alert and oriented x4. Family at bedside to translate.

## 2013-03-10 NOTE — H&P (Addendum)
Family Medicine Teaching [facility] Admission History and Physical Service Pager: [phone]  Patient name: Allen [name] Medical record number: [id number] Date of birth: Jan 25, 1919 Age: 79 y.o. Gender: male  Primary Care Provider: Barbaraann Barthel, MD Consultants: None Code Status: Full for now, wants to discuss with son and daughter  Chief Complaint: Weakness, dysuria  Assessment and Plan: Allen [name] is a 79 y.o. male presenting with weakness and dysuria found to have UTI and pneumonia. PMH is significant for HTN, pacemaker, and BPH.   Urinary Tract Infection: Month-long history of dysuria with history of BPH concerning  - UA nitrite positive, looks worse than yesterday, though sent yesterday to clinic (1+ cocci singles and chains on micro yesterday) [ ]  f/u Urine cultures from 10/2 and 10/3 [ ]  f/u Blood cultures ordered today though after 1 dose vancomycin - holding gabapentin due to ?urinary retention possibility  BPH: No medical treatment at home - Monitor UOP and cath prn urinary retention   CA-Pneumonia: No complaint of cough at this time. Rales at right lung base, saturating well. New RLL infiltrate on CXR. s/p vancomycin 1g in ED - Starting rocephin to cover UTI and adding azithromycin for atypical coverage - Monitor oxygenation status, O2 prn  Dehydration: Cr: 1.40. Apparent creatinine baseline 1.0-1.1 - Will bolus NS and continue hydration, encourage po fluids - Strict I/O  Hyponatremia: Na: 126 appears slight worsening of chronic Na 130 - Recheck BMP tomorrow AM  Dizziness. Thought to be due to infection/dehydration more likely than central process.  - Gentle hydration given cardiac history - Orthostatic VS  - Head CT and cardiac workup if continues tomorrow AM after rehydration - PT/OT  HTN - Not on antihypertensive at home. - Borderline low pressures now  - monitor VS - On pravastatin 10mg , will replace with formulary  statin - Continue ASA 81  Implanted pacemaker: Has not seen cardiologist in years, so we will touch base with cardiology Re: follow up management - ECG dual-paced - POC trop neg - Repeat ECG in AM - Telemetry  Constipation:  - Colace and miralax - Consider Abd XR if this does not resolve  FEN/GI: 250cc NS bolus followed by NS at 75cc/hr, Regular diet Prophylaxis: Lovenox 30mg , continue PPI   Disposition: Admit to observation on telemetry, attending Dr. Lum Babe  History of Present Illness: Allen [name] is a 79 y.o. male presenting with the chief complaint of dizziness and weakness since this morning. PMH significant for BPH, recently diagnosed   He reports about a month-long history of burning when urinating. Seen in clinic yesterday with 2-3 days dysuria, no fevers/chills/sweats/suprapubic or back pain, decreased appetite, or dizziness. U/A microscopy showed cocci, and 500 cipro BID was prescribed at that visit, but has not taken this. He has still had burning with urination and very concentrated urine. He has been able to eat and drink plenty. He has a history of BPH and complains of increasing frequency of low volume. He also complains of constipation today, but denies abdominal pain and has taken nothing for this.  Has had a pacemaker for 5 or 6 years, and has not been consistently followed by a cardiologist but denies any cardiac issues since placement of pacer. Denies CP, SOB, edema.    No fevers, chills, rash, HA, or throat pain. Has had no changes of appetite and no problems eating. Currently denies nausea, but has had this recently. He performs ADLs independently though he lives with one of his children.  Review Of Systems: Per HPI.  Otherwise 12 point review of systems was performed and was unremarkable.  Patient Active Problem List   Diagnosis Date Noted  . Complicated UTI (urinary tract infection) 03/09/2013  . Memory loss 12/23/2012  . Risk for falls  12/23/2012  . Urinary incontinence 12/23/2012  . Nausea & vomiting 01/18/2012  . Impacted cerumen of left ear 01/01/2012  . TSH elevation 03/26/2011  . Anemia 03/26/2011  . Lumbar radiculopathy, chronic 11/17/2010  . SECOND DEGREE ATRIOVENTRICULAR HEART BLOCK 03/13/2010  . CONSTIPATION 04/17/2009  . LOSS, HEARING NOS 11/09/2006  . BENIGN PROSTATIC HYPERTROPHY, WITH OBSTRUCTION 11/09/2006  . HYPERTENSION, BENIGN 10/20/2006  . ALLERGIC RHINITIS, SEASONAL 10/20/2006  . HYPERLIPIDEMIA 08/05/2006  . RESTLESS LEGS SYNDROME 08/05/2006  . GASTROESOPHAGEAL REFLUX, NO ESOPHAGITIS 08/05/2006   Past Medical History: Past Medical History  Diagnosis Date  . Pacemaker   . Hypertension   . GERD (gastroesophageal reflux disease)   . H/O hiatal hernia   . Arthritis     hands and feet   Past Surgical History: Past Surgical History  Procedure Laterality Date  . Testicle surgery  1933  . Inguinal hernia repair  1980  . Pacemaker placement  03/2009    syncope - bradycardia, and 2nd degree block   . Hernia repair    . Esophagogastroduodenoscopy  01/29/2012    Procedure: ESOPHAGOGASTRODUODENOSCOPY (EGD);  Surgeon: Barrie Folk, MD;  Location: [facility]COPY;  Service: Endoscopy;  Laterality: N/A;  Intrepreter scheduled 930-12.   Social History: History  Substance Use Topics  . Smoking status: Former Games developer  . Smokeless tobacco: Not on file  . Alcohol Use: No   Please also refer to relevant sections of EMR.  Family History: History reviewed. No pertinent family history. Allergies and Medications: No Known Allergies No current facility-administered medications on file prior to encounter.   Current Outpatient Prescriptions on File Prior to Encounter  Medication Sig Dispense Refill  . aspirin EC 81 MG tablet Take 81 mg by mouth daily.        . feeding supplement (RESOURCE BREEZE) LIQD Take 1 Container by mouth 2 (two) times daily between meals.  12 Container  1  . Fish Oil OIL Take 1  capsule by mouth 3 (three) times daily.       . ciprofloxacin (CIPRO) 500 MG tablet Take 1 tablet (500 mg total) by mouth 2 (two) times daily. For 7 days  14 tablet  0    Objective: BP 107/57  Pulse 71  Temp(Src) 98.6 F (37 C) (Oral)  Resp 20  Ht 5\' 4"  (1.626 m)  Wt 141 lb (63.957 kg)  BMI 24.19 kg/m2  SpO2 94% Gen: Elderly but well-appearing 79 yo male in NAD HEENT: PERRL, MMM, posterior oropharynx clear, hearing aids in place, EOMI Pulm: Non-labored; rales at R base, no wheezes CV: Regular rate though distant, no murmur; distal pulses intact/symmetric; no LE edema or elevated JVP GI: Normoactive BS; soft, non-tender, non-distended, no HSM Skin: No rashes, wounds, ulcers MSK: Normal muscle bulk for age, no digital clubbing/cyanosis Neuro: CN II-XII without deficits grossly, sensation intact to light touch Psych: A&Ox3, speech clear and coherent  Labs and Imaging: CBC BMET   Recent Labs Lab 03/10/13 1143 03/10/13 1220  WBC 14.1*  --   HGB 13.2 13.6  HCT 36.0* 40.0  PLT 221  --     Recent Labs Lab 03/10/13 1220  NA 126*  K 4.0  CL 93*  BUN 11  CREATININE 1.40*  GLUCOSE 150*     ECG Non-ischemic, paced rhythm    03/10/2013 13:58  Color, Urine AMBER (A)  APPearance HAZY (A)  Specific Gravity, Urine 1.031 (H)  pH 6.0  Glucose NEGATIVE  Bilirubin Urine SMALL (A)  Ketones, ur 15 (A)  Protein 30 (A)  Urobilinogen, UA 4.0 (H)  Nitrite POSITIVE (A)  Leukocytes, UA LARGE (A)  Hgb urine dipstick NEGATIVE  Urine-Other MUCOUS PRESENT  WBC, UA TOO NUMEROUS TO COUNT  RBC / HPF 0-2  Squamous Epithelial / LPF FEW (A)  Bacteria, UA FEW (A)  Casts GRANULAR CAST (A)   CXR FINDINGS:  1458 hours. Interval development of airspace disease in the right  lung base. Left lung is clear. Cardiopericardial silhouette is at  upper limits of normal for size. Left 2 lead permanent pacemaker is  again noted. Telemetry leads overlie the chest.  IMPRESSION:  New airspace  disease at the right lung base suggests pneumonia.  Ryan B. Jarvis Newcomer, MD, PGY-1 03/10/2013 7:16 PM [facility]   I have seen and examined patient with PGY-1 and agree with assessment and plan. My additions are included in purple above.  Leona Singleton, MD 03/11/2013 12:00 AM PGY-2

## 2013-03-10 NOTE — ED Notes (Signed)
Admitting doctors at bedside.

## 2013-03-10 NOTE — ED Notes (Signed)
Family practice consult at bedside.  

## 2013-03-10 NOTE — ED Provider Notes (Signed)
CSN: [id number]     Arrival date & time 03/10/13  1104 History   First MD Initiated Contact with Patient 03/10/13 1130     Chief Complaint  Patient presents with  . Dizziness   (Consider location/radiation/quality/duration/timing/severity/associated sxs/prior Treatment) HPI Comments: 79 year old male with a history of pacemaker placement secondary to second-degree AV block with bradycardia and syncope in the past. He presents today because of generalized weakness. He states that he was diagnosed with a possible urinary infection yesterday at his family doctor's office but when he woke up this morning he was feeling weak all over. He denies any focal weakness, he does admit to having nausea no abdominal pain, chest pain, shortness of breath or fevers. Nothing seems to make this better, worse with standing. He has had no medications prior to arrival but seems to be prescribed ciprofloxacin yesterday.  The history is provided by the patient.    Past Medical History  Diagnosis Date  . Pacemaker   . Hypertension   . GERD (gastroesophageal reflux disease)   . H/O hiatal hernia   . Arthritis     hands and feet   Past Surgical History  Procedure Laterality Date  . Testicle surgery  1933  . Inguinal hernia repair  1980  . Pacemaker placement  03/2009    syncope - bradycardia, and 2nd degree block   . Hernia repair    . Esophagogastroduodenoscopy  01/29/2012    Procedure: ESOPHAGOGASTRODUODENOSCOPY (EGD);  Surgeon: Barrie Folk, MD;  Location: [facility] ENDOSCOPY;  Service: Endoscopy;  Laterality: N/A;  Intrepreter scheduled 930-12.   History reviewed. No pertinent family history. History  Substance Use Topics  . Smoking status: Former Games developer  . Smokeless tobacco: Not on file  . Alcohol Use: No    Review of Systems  All other systems reviewed and are negative.    Allergies  Review of patient's allergies indicates no known allergies.  Home Medications   Current Outpatient Rx  Name   Route  Sig  Dispense  Refill  . aspirin EC 81 MG tablet   Oral   Take 81 mg by mouth daily.           Marland Kitchen CALCIUM-VITAMIN D PO   Oral   Take 1 capsule by mouth daily.         . feeding supplement (RESOURCE BREEZE) LIQD   Oral   Take 1 Container by mouth 2 (two) times daily between meals.   12 Container   1   . Fish Oil OIL   Oral   Take 1 capsule by mouth 3 (three) times daily.          Marland Kitchen gabapentin (NEURONTIN) 600 MG tablet   Oral   Take 600 mg by mouth 2 (two) times daily.         Marland Kitchen omeprazole (PRILOSEC) 20 MG capsule   Oral   Take 20 mg by mouth 2 (two) times daily.         . pravastatin (PRAVACHOL) 10 MG tablet   Oral   Take 10 mg by mouth daily.         . ciprofloxacin (CIPRO) 500 MG tablet   Oral   Take 1 tablet (500 mg total) by mouth 2 (two) times daily. For 7 days   14 tablet   0    BP 107/57  Pulse 71  Temp(Src) 98.6 F (37 C) (Oral)  Resp 20  Ht 5\' 4"  (1.626 m)  Wt 141 lb (63.957  kg)  BMI 24.19 kg/m2  SpO2 94% Physical Exam  Nursing note and vitals reviewed. Constitutional: He appears well-developed and well-nourished. No distress.  HENT:  Head: Normocephalic and atraumatic.  Mouth/Throat: Oropharynx is clear and moist. No oropharyngeal exudate.  Eyes: Conjunctivae and EOM are normal. Pupils are equal, round, and reactive to light. Right eye exhibits no discharge. Left eye exhibits no discharge. No scleral icterus.  Neck: Normal range of motion. Neck supple. No JVD present. No thyromegaly present.  Cardiovascular: Normal rate, regular rhythm, normal heart sounds and intact distal pulses.  Exam reveals no gallop and no friction rub.   No murmur heard. Pulmonary/Chest: Effort normal. No respiratory distress. He has no wheezes. He has rales ( Slight rales to the right lower lobe).  Abdominal: Soft. Bowel sounds are normal. He exhibits no distension and no mass. There is no tenderness.  Musculoskeletal: Normal range of motion. He exhibits no  edema and no tenderness.  Lymphadenopathy:    He has no cervical adenopathy.  Neurological: He is alert. Coordination normal.  The patient is able to perform all commands without difficulty, speech is normal, no facial droop, cranial nerves III through XII are intact, normal strength and sensation of all 4 extremities, normal reflexes the patellar tendons.  Skin: Skin is warm and dry. No rash noted. No erythema.  Psychiatric: He has a normal mood and affect. His behavior is normal.    ED Course  Procedures (including critical care time) Labs Review Labs Reviewed  URINALYSIS, ROUTINE W REFLEX MICROSCOPIC - Abnormal; Notable for the following:    Color, Urine AMBER (*)    APPearance HAZY (*)    Specific Gravity, Urine 1.031 (*)    Bilirubin Urine SMALL (*)    Ketones, ur 15 (*)    Protein, ur 30 (*)    Urobilinogen, UA 4.0 (*)    Nitrite POSITIVE (*)    Leukocytes, UA LARGE (*)    All other components within normal limits  CBC WITH DIFFERENTIAL - Abnormal; Notable for the following:    WBC 14.1 (*)    RBC 4.05 (*)    HCT 36.0 (*)    MCHC 36.7 (*)    Neutrophils Relative % 90 (*)    Neutro Abs 12.7 (*)    Lymphocytes Relative 5 (*)    All other components within normal limits  URINE MICROSCOPIC-ADD ON - Abnormal; Notable for the following:    Squamous Epithelial / LPF FEW (*)    Bacteria, UA FEW (*)    Casts GRANULAR CAST (*)    All other components within normal limits  POCT I-STAT, CHEM 8 - Abnormal; Notable for the following:    Sodium 126 (*)    Chloride 93 (*)    Creatinine, Ser 1.40 (*)    Glucose, Bld 150 (*)    Calcium, Ion 1.11 (*)    All other components within normal limits  URINE CULTURE  POCT I-STAT TROPONIN I   Imaging Review Dg Chest Port 1 View  03/10/2013   CLINICAL DATA:  Chest pain with weakness and dizziness.  EXAM: PORTABLE CHEST - 1 VIEW  COMPARISON:  06/29/2012  FINDINGS: 1458 hours. Interval development of airspace disease in the right lung base.  Left lung is clear. Cardiopericardial silhouette is at upper limits of normal for size. Left 2 lead permanent pacemaker is again noted. Telemetry leads overlie the chest.  IMPRESSION: New airspace disease at the right lung base suggests pneumonia.   Electronically Signed  By: Kennith Center M.D.   On: 03/10/2013 15:08    MDM   1. UTI (lower urinary tract infection)   2. Hyponatremia   3. CAP (community acquired pneumonia)    The patient has a benign appearance, vital signs are normal except for his oxygen saturations are 91%. There are some rales at the right base, we'll obtain a chest x-ray in addition her urinalysis and lab work to evaluate for other sources of the patient's weakness. Clinically he has a normal rate, normal pulses and normal blood pressure. This does not appear to be in acute pacemaker issue. Will investigate if no other etiology the patient's generalized weakness exists.  The patient does have a urinary tract infection, he also has a significant infiltrate on the right lower lobe of his chest x-ray. I discussed this with the admitting service the family practice service who will come to see the patient in consultation for admission. He also has hyponatremia and a leukocytosis confirming that his infections are likely more serious than initially thought. He was initially hypotensive but this has improved somewhat, he is not febrile nor is he tachycardic. His oxygen saturations are between 91-94% on room air but he does not appear to have any respiratory distress. About a quarter, will be admitted to the hospital to  Vida Roller, MD 03/10/13 [id number]

## 2013-03-11 ENCOUNTER — Other Ambulatory Visit: Payer: Self-pay

## 2013-03-11 DIAGNOSIS — I10 Essential (primary) hypertension: Secondary | ICD-10-CM | POA: Diagnosis present

## 2013-03-11 DIAGNOSIS — Z79899 Other long term (current) drug therapy: Secondary | ICD-10-CM | POA: Diagnosis not present

## 2013-03-11 DIAGNOSIS — K5900 Constipation, unspecified: Secondary | ICD-10-CM | POA: Diagnosis present

## 2013-03-11 DIAGNOSIS — G2581 Restless legs syndrome: Secondary | ICD-10-CM | POA: Diagnosis present

## 2013-03-11 DIAGNOSIS — N401 Enlarged prostate with lower urinary tract symptoms: Secondary | ICD-10-CM

## 2013-03-11 DIAGNOSIS — N390 Urinary tract infection, site not specified: Secondary | ICD-10-CM | POA: Diagnosis not present

## 2013-03-11 DIAGNOSIS — R42 Dizziness and giddiness: Secondary | ICD-10-CM | POA: Diagnosis not present

## 2013-03-11 DIAGNOSIS — N138 Other obstructive and reflux uropathy: Secondary | ICD-10-CM | POA: Diagnosis not present

## 2013-03-11 DIAGNOSIS — R569 Unspecified convulsions: Secondary | ICD-10-CM | POA: Diagnosis not present

## 2013-03-11 DIAGNOSIS — E871 Hypo-osmolality and hyponatremia: Secondary | ICD-10-CM | POA: Diagnosis not present

## 2013-03-11 DIAGNOSIS — E785 Hyperlipidemia, unspecified: Secondary | ICD-10-CM | POA: Diagnosis present

## 2013-03-11 DIAGNOSIS — R55 Syncope and collapse: Secondary | ICD-10-CM | POA: Diagnosis not present

## 2013-03-11 DIAGNOSIS — K219 Gastro-esophageal reflux disease without esophagitis: Secondary | ICD-10-CM | POA: Diagnosis present

## 2013-03-11 DIAGNOSIS — J189 Pneumonia, unspecified organism: Secondary | ICD-10-CM | POA: Diagnosis present

## 2013-03-11 DIAGNOSIS — B952 Enterococcus as the cause of diseases classified elsewhere: Secondary | ICD-10-CM | POA: Diagnosis present

## 2013-03-11 DIAGNOSIS — Z950 Presence of cardiac pacemaker: Secondary | ICD-10-CM | POA: Diagnosis not present

## 2013-03-11 DIAGNOSIS — Z87891 Personal history of nicotine dependence: Secondary | ICD-10-CM | POA: Diagnosis not present

## 2013-03-11 DIAGNOSIS — Z7982 Long term (current) use of aspirin: Secondary | ICD-10-CM | POA: Diagnosis not present

## 2013-03-11 LAB — CBC
HCT: 34.9 % — ABNORMAL LOW (ref 39.0–52.0)
Hemoglobin: 12.5 g/dL — ABNORMAL LOW (ref 13.0–17.0)
MCH: 31.9 pg (ref 26.0–34.0)
MCHC: 35.8 g/dL (ref 30.0–36.0)
MCV: 89 fL (ref 78.0–100.0)
Platelets: 226 10*3/uL (ref 150–400)
RDW: 12.8 % (ref 11.5–15.5)

## 2013-03-11 LAB — BASIC METABOLIC PANEL
BUN: 10 mg/dL (ref 6–23)
CO2: 22 mEq/L (ref 19–32)
Calcium: 8.1 mg/dL — ABNORMAL LOW (ref 8.4–10.5)
Chloride: 96 mEq/L (ref 96–112)
Glucose, Bld: 108 mg/dL — ABNORMAL HIGH (ref 70–99)
Potassium: 3.9 mEq/L (ref 3.5–5.1)

## 2013-03-11 LAB — CULTURE, URINE COMPREHENSIVE: Colony Count: >=100000

## 2013-03-11 MED ORDER — SODIUM CHLORIDE 0.9 % IV SOLN
1.0000 g | Freq: Four times a day (QID) | INTRAVENOUS | Status: DC
  Administered 2013-03-11 – 2013-03-12 (×6): 1 g via INTRAVENOUS
  Filled 2013-03-11 (×8): qty 1000

## 2013-03-11 MED ORDER — ENOXAPARIN SODIUM 40 MG/0.4ML ~~LOC~~ SOLN
40.0000 mg | SUBCUTANEOUS | Status: DC
  Administered 2013-03-11 – 2013-03-12 (×2): 40 mg via SUBCUTANEOUS
  Filled 2013-03-11 (×3): qty 0.4

## 2013-03-11 MED ORDER — TRAMADOL HCL 50 MG PO TABS
50.0000 mg | ORAL_TABLET | Freq: Once | ORAL | Status: AC
  Administered 2013-03-11: 13:00:00 50 mg via ORAL
  Filled 2013-03-11: qty 1

## 2013-03-11 MED ORDER — TAMSULOSIN HCL 0.4 MG PO CAPS
0.4000 mg | ORAL_CAPSULE | Freq: Every day | ORAL | Status: DC
  Filled 2013-03-11: qty 1

## 2013-03-11 MED ORDER — TAMSULOSIN HCL 0.4 MG PO CAPS
0.4000 mg | ORAL_CAPSULE | Freq: Once | ORAL | Status: AC
  Administered 2013-03-11: 11:00:00 0.4 mg via ORAL
  Filled 2013-03-11: qty 1

## 2013-03-11 NOTE — Progress Notes (Signed)
Called to patient's room by family.  Upon entering with other staff, pt was in chair unresponsive and shaking.  Per OT, pt had vomited prior to losing consciousness.  Family stated "He said he was feeling dizzy and his head turned to the left and then he became stiff."  VSS.  BP 137/76, HR 65, O2 sats 95% on RA.  CBG 159.  When asked if pt had any history of seizures, told by family that he had one prior to his pacemaker placement.  Informed Family practice about patient's situation.  Pt c/o feeling weak and is confused according to family.  Pt resting in bed.  Will continue to monitor.

## 2013-03-11 NOTE — Significant Event (Signed)
Rapid Response Event Note  Overview:  Called for patient unresponsive and seizure like activity Time Called: 1440 Arrival Time: 1443 Event Type: Other (Comment)  Initial Focused Assessment:  Upon arrival to patients room, Rn and family at bedside.  Pt was assisting patient back to bed.  As per PT, Patient was sitting in chair, when he complained of being dizzy started to vomit and was unresponsive for about 20 seconds where his body became stiff, head deviated to the left with some head jerky.  On my arrival Patient was alert and oriented, ( granddaughter translated) MAE and stated he felt weak.    Interventions: VSS, MD paged and updated   Event Summary:RN to call if assistance needed   at      at          [facility], Maryagnes Amos

## 2013-03-11 NOTE — Evaluation (Signed)
Physical Therapy Evaluation Patient Details Name: Allen [name] MRN: [id number] DOB: 20-Mar-1919 Today's Date: 03/11/2013 Time: 4098-1191 PT Time Calculation (min): 29 min  PT Assessment / Plan / Recommendation History of Present Illness  Pt presents with pain with urination, weakness, and dizziness.  Clinical Impression  Pt is not still experiencing dizziness when up and strength has returned as well. Largest complaint is painful urination. He ambulated 250' at supervision/ mod I level. No acute or f/u PT recommended at this time, however, was instructed to walk in hall with family/ nsg 3-5x/ day and was given HEP to perform to maintain strength until d/c home. PT signing off, appreciate referral.    PT Assessment  Patent does not need any further PT services    Follow Up Recommendations  No PT follow up    Does the patient have the potential to tolerate intense rehabilitation      Barriers to Discharge        Equipment Recommendations  None recommended by PT    Recommendations for Other Services     Frequency      Precautions / Restrictions Precautions Precautions: None Restrictions Weight Bearing Restrictions: No   Pertinent Vitals/Pain Faces 4/10 pain with urination, RN notified      Mobility  Bed Mobility Bed Mobility: Supine to Sit;Sitting - Scoot to Edge of Bed Supine to Sit: 7: Independent Sitting - Scoot to [place] of Bed: 7: Independent Details for Bed Mobility Assistance: pt able to sit straight up in bed from 30 degrees, into long sitting, with no difficulty, able to get to side of bed without difficulty as well Transfers Transfers: Sit to Stand;Stand to Sit Sit to Stand: 6: Modified independent (Device/Increase time);From bed Stand to Sit: 6: Modified independent (Device/Increase time);To chair/3-in-1 Ambulation/Gait Ambulation/Gait Assistance: 5: Supervision;4: Min assist Ambulation Distance (Feet): 250 Feet Assistive device: None;1 person  hand held assist Ambulation/Gait Assistance Details: pt began holding therapist's hand as he was initally a but unsteady but after first 100', ambulated with no assistance at supervision/ mod I level. Gait Pattern: Within Functional Limits Gait velocity: WFL Stairs: No Wheelchair Mobility Wheelchair Mobility: No    Exercises General Exercises - Lower Extremity Ankle Circles/Pumps: AROM;Both;10 reps;Seated Heel Slides: AROM;10 reps;Both;Seated Straight Leg Raises: AROM;Both;10 reps;Seated   PT Diagnosis:    PT Problem List:   PT Treatment Interventions:       PT Goals(Current goals can be found in the care plan section) Acute Rehab PT Goals Patient Stated Goal: return home PT Goal Formulation: No goals set, d/c therapy  Visit Information  Last PT Received On: 03/11/13 Assistance Needed: +1 History of Present Illness: Pt presents with pain with urination, weakness, and dizziness.       Prior Functioning  Home Living Family/patient expects to be discharged to:: Private residence Living Arrangements: Spouse/significant other;Children Available Help at Discharge: Family;Available 24 hours/day Type of Home: House Home Access: Stairs to enter [employer] of Steps: 3 Entrance Stairs-Rails: Right Home Layout: One level Home Equipment: None Additional Comments: pt does not drive but does everything else for himself Prior Function Level of Independence: Independent Communication Communication: Prefers language other than English Dominant Hand: Right    Cognition  Cognition Arousal/Alertness: Awake/alert Behavior During Therapy: WFL for tasks assessed/performed Overall Cognitive Status: Within Functional Limits for tasks assessed Memory: Decreased short-term memory (family reports mild STM issues, not new)    Extremity/Trunk Assessment Upper Extremity Assessment Upper Extremity Assessment: Overall WFL for tasks assessed Lower Extremity Assessment  Lower  Extremity Assessment: Overall WFL for tasks assessed Cervical / Trunk Assessment Cervical / Trunk Assessment: Normal   Balance Balance Balance Assessed: Yes Dynamic Standing Balance Dynamic Standing - Balance Support: No upper extremity supported;During functional activity Dynamic Standing - Level of Assistance: 6: Modified independent (Device/Increase time)  End of Session PT - End of Session Equipment Utilized During Treatment: Gait belt Activity Tolerance: Patient tolerated treatment well Patient left: in chair;with call bell/phone within reach;with family/visitor present Nurse Communication: Mobility status;Patient requests pain meds  GP Functional Assessment Tool Used: clincial judgement Functional Limitation: Mobility: Walking and moving around Mobility: Walking and Moving Around Current Status [phone]): At least 1 percent but less than 20 percent impaired, limited or restricted Mobility: Walking and Moving Around Goal Status [phone]): At least 1 percent but less than 20 percent impaired, limited or restricted Mobility: Walking and Moving Around Discharge Status [phone]): At least 1 percent but less than 20 percent impaired, limited or restricted  Lyanne Co, PT  Acute Rehab Services  [phone]  Lyanne Co 03/11/2013, 11:45 AM

## 2013-03-11 NOTE — Progress Notes (Signed)
Pt bladder scanned post void.  Showed about 350cc urine retained in bladder.

## 2013-03-11 NOTE — Progress Notes (Signed)
FMTS Brief Progress Note:  Called to pt's room after an apparent episode of Transient Loss of Consciousness.  Pt reportedly was sitting in bedside chair, became diaphoretic, nauseated, dizzy then proceeded to have LOC accompanied by convulsions.  No prior hx of seizure activity but this did seem to be similar to his prior episodes of syncope that was associated with his heart block and have been relieved by his dual chamber pacer.    He reports being weak at this time and his urinary symptoms are slowing improving.  Denies any chest pain, dyspnea, or orthpnea.  Pt did receive Flomax at 1050 today prior to the episode.  He was previously on this medication however it was stopped for unknown reasons, now presumably orthostasis.  Full set of vitals following episode were normal per nursing note.  TELE strip reviewed and at the time of the event there was no captured pacing evident on telemetry however significant artifact.  Given he has not seen cardiology in quite some time and now new syncopal episode with questionable missed pacer spike will have cardiology evaluate him as an In Pt.  Was hopeful this could be completed as OP but now workup indicated today.  Will cycle CEs as safe measure but less likely.  Will plan to measure post void residual with bladder SCAN now.  If >155ml will place foley catheter.  Likely needs Urology workup as OP at this point either way.  I do not favor this being a primary seizure given no prior seizure activity and evidence of potential cardiac cause with missed pacer spikes and flomax.  No indication for advanced imaging as no trauma.  Will follow closely.    Andrena Mews, DO [facility] Resident - PGY-3 03/11/2013 3:40 PM

## 2013-03-11 NOTE — H&P (Signed)
FMTS Attending Progress Note: Allen Jafri,MD I  have seen and examined this patient, reviewed their chart. I have discussed this patient with the resident. I agree with the resident's findings, assessment and care plan.

## 2013-03-11 NOTE — H&P (Signed)
FMTS Attending Admission Note: Anuj Summons,MD I  have seen and examined this patient, reviewed their chart. I have discussed this patient with the resident. I agree with the resident's findings, assessment and care plan.  

## 2013-03-11 NOTE — Progress Notes (Signed)
Family Medicine Teaching Service Daily Progress Note Intern Pager: [phone]  Patient name: Allen [name] Medical record number: [id number] Date of birth: Sep 02, 1918 Age: 79 y.o. Gender: male  Primary Care Provider: Barbaraann Barthel, MD Consultants:  Code Status: FULL  Pt Overview and Major Events to Date:  10/3 - Seen in clinic with UTI - +enterococcus (no sensitives at this time) 10/3 - Admitted through ED due to worsening dysuria 10/4 - urinary retention postvoid residual.  Syncopal episode with Flomax.  Foley placed  Assessment and Plan: Allen [name] is a 79 y.o. male presenting with weakness and dysuria found to have UTI and pneumonia. PMH is significant for HTN, pacemaker, and BPH.   Urinary Tract Infection with dysuria: Month-long history of dysuria with history of BPH concerning  + UTI with enterococcus positive culture from 10/3 [ ]  f/u sensitives, will need PO regimen established today with sensitivites [ ]  f/u Blood cultures ordered today though after 1 dose vancomycin - ADDED AMPICILLIN UNTIL SENSITIVES RETURN.  Hopeful will be able to tx UTI and PNA with Levaquin - Will add Ibuprofen for anti-inflammatory effect  BPH: No current medical treatment at home - positive post void residual but intolerant of Flomax. -Discussed with Dr. Berneice Heinrich with Urology who recommended starting Finasteride, and Rapaflo if able while hospitalized given no significant orthostasis has been noted with Rapaflo.   - Consider voiding trial in AM if tolerating meds otherwise will need to be seen in UROLOGY Clinic in 1-2 weeks for next voiding trial.    CA-Pneumonia: No resp complaints currently Rales at right lung base, saturating well. New RLL infiltrate on CXR. s/p vancomycin 1g in ED - On Rocephin & Azithro for CAP, see above - Monitor oxygenation status, O2 prn  Dehydration: Cr: 1.40 at admission, Resolved today. Apparent creatinine baseline 1.0-1.1  Hyponatremia: Na:  126 at admission, currently stable ~ baseline of 130  Dizziness.  Improved again today.  Syncopal episode yesterday.  Cardiology agree's likely neurogenic syncope.   - FLOMAX added to "allergy list" - watch with addition of Rapaflo but urology confident that should not cause syncope -  PT/OT denies needs at home - Carotid dopplers today for completeness but low likelihood of neurologic event causing symptoms  HTN & CV Risk- Not on antihypertensive at home. - no indication for intervention  - On low potency Statin; continue at this time, no prior significant atherosclerotic disease - Recheck TSH given hx of mild elevation - Continue ASA 81  Implanted Bichamber pacemaker:  [ ]  Needs OP follow up with EP  Constipation: RESOLVED  FEN/GI: regular diet, KVO fluids  Prophylaxis: Lovenox 30mg , continue PPI  Disposition: Pending cultures  Subjective: Foley catheter placed yesterday.  Patient's symptoms stable. resolved.  Denies any fevers rigors or chills.  No further syncopal episodes  Objective: Temp:  [97.6 F (36.4 C)-98.4 F (36.9 C)] 97.6 F (36.4 C) (10/04 2213) Pulse Rate:  [62-70] 70 (10/04 2213) Resp:  [18] 18 (10/04 2213) BP: (129-147)/(67-78) 147/78 mmHg (10/04 2213) SpO2:  [94 %-95 %] 95 % (10/04 2213) Weight:  [139 lb 14.4 oz (63.458 kg)] 139 lb 14.4 oz (63.458 kg) (10/04 2440) Physical Exam: PHYSICAL EXAM: GENERAL: Adult hispanic  male. In no discomfort; no respiratory distress  PSYCH: alert and appropriate, good insight   HNEENT: MMM  CARDIO: RRR, S1/S2 heard, no murmur  LUNGS: crackeles in R lung base  ABDOMEN: Soft non-tender, no superpubic tenderness  EXTREM:  Warm, well perfused.  Moves all 4  extremities spontaneously; no lateralization.  No noted foot lesions.  Distal pulses intact.  trace pretibial edema.  GU: Foley in place, red sediment in tube, no uretheral erythema  SKIN:     Laboratory:  Recent Labs Lab 03/10/13 1143 03/10/13 1220 03/11/13 0550   WBC 14.1*  --  9.4  HGB 13.2 13.6 12.5*  HCT 36.0* 40.0 34.9*  PLT 221  --  226    Recent Labs Lab 03/10/13 1220 03/11/13 0550  NA 126* 131*  K 4.0 3.9  CL 93* 96  CO2  --  22  BUN 11 10  CREATININE 1.40* 0.92  CALCIUM  --  8.1*  GLUCOSE 150* 108*    Blood Cx - NGTD Urine CX - Enterococcus (pending sensitivies)  Imaging/Diagnostic Tests: CXR - + for R LL infiltrate  Andrena Mews, DO 03/11/2013, 11:57 PM PGY-3, [facility] FPTS Intern pager: [phone], text pages welcome

## 2013-03-11 NOTE — Care Management Utilization Note (Signed)
UR completed.    Davanee Klinkner Wise Hildegarde Dunaway, RN, BSN Phone #[phone]  

## 2013-03-11 NOTE — Progress Notes (Signed)
OT Discharge Note  Patient Details Name: Deniro Laymon MRN: [id number] DOB: 12-30-1918   Cancelled Treatment:    Reason Eval/Treat Not Completed: OT screened, no needs identified, will sign off (general weakness/ near baseline)  Harolyn Rutherford Pager: [phone]  03/11/2013, 2:32 PM

## 2013-03-11 NOTE — H&P (Signed)
Family Medicine Teaching Service Daily Progress Note Intern Pager: [phone]  Patient name: Allen [name] Medical record number: [id number] Date of birth: 1919-06-05 Age: 79 y.o. Gender: male  Primary Care Provider: Barbaraann Barthel, MD Consultants:  Code Status: FULL  Pt Overview and Major Events to Date:  10/3 - Seen in clinic with UTI - +enterococcus (no sensitives at this time) 10/3 - Admitted through ED due to worsening dysuria  Assessment and Plan: Allen [name] is a 79 y.o. male presenting with weakness and dysuria found to have UTI and pneumonia. PMH is significant for HTN, pacemaker, and BPH.   Urinary Tract Infection: Month-long history of dysuria with history of BPH concerning  + UTI with enterococcus positive culture from 10/3 [ ]  f/u sensitivies  [ ]  f/u Blood cultures ordered today though after 1 dose vancomycin - ADDED AMPICILLIN UNTIL SENSITIVES RETURN.  Hopeful will be able to tx UTI and PNA with Levaquin  BPH: No current medical treatment at home - positive post void residual >> START FLOMAX - Consider foley if post void residual remains >150 with flomax  CA-Pneumonia: No resp complaints currently Rales at right lung base, saturating well. New RLL infiltrate on CXR. s/p vancomycin 1g in ED - On Rocephin & Azithro for CAP, see above - Monitor oxygenation status, O2 prn  Dehydration: Cr: 1.40 at admission, Resolved today. Apparent creatinine baseline 1.0-1.1  Hyponatremia: Na: 126 at admission, currently stable ~ baseline of 130  Dizziness.  Does not report today, no orthostatic symptoms - monitor with addition of Flomax - PT eval  HTN & CV Risk- Not on antihypertensive at home. - no indication for intervention  - On low potency Statin; continue at this time, no prior significant atherosclerotic disease - Continue ASA 81  Implanted pacemaker: Has not seen cardiologist in years, so we will touch base with cardiology Re: follow up  management - ECG dual-paced - POC trop neg - Repeat ECG in AM - Telemetry  Constipation: RESOLVED  FEN/GI: regular diet, KVO fluids  Prophylaxis: Lovenox 30mg , continue PPI  Disposition: Pending cultures  Subjective: reports continue frequency and dysuria  Objective: Temp:  [98 F (36.7 C)-98.6 F (37 C)] 98.4 F (36.9 C) (10/04 0644) Pulse Rate:  [62-85] 62 (10/04 0644) Resp:  [14-20] 18 (10/04 0644) BP: (97-138)/(49-80) 138/77 mmHg (10/04 0644) SpO2:  [90 %-96 %] 94 % (10/04 0644) Weight:  [139 lb 1.6 oz (63.095 kg)-141 lb (63.957 kg)] 139 lb 14.4 oz (63.458 kg) (10/04 1478) Physical Exam: PHYSICAL EXAM: GENERAL: Adult hispanic  male. In no discomfort; no respiratory distress  PSYCH: alert and appropriate, good insight   HNEENT: MMM  CARDIO: RRR, S1/S2 heard, no murmur  LUNGS: crackeles in R lung base  ABDOMEN: Soft non-tender, no superpubic tenderness  EXTREM:  Warm, well perfused.  Moves all 4 extremities spontaneously; no lateralization.  No noted foot lesions.  Distal pulses intact.  trace pretibial edema.  GU:   SKIN:     Laboratory:  Recent Labs Lab 03/10/13 1143 03/10/13 1220 03/11/13 0550  WBC 14.1*  --  9.4  HGB 13.2 13.6 12.5*  HCT 36.0* 40.0 34.9*  PLT 221  --  226    Recent Labs Lab 03/10/13 1220 03/11/13 0550  NA 126* 131*  K 4.0 3.9  CL 93* 96  CO2  --  22  BUN 11 10  CREATININE 1.40* 0.92  CALCIUM  --  8.1*  GLUCOSE 150* 108*    Blood Cx - NGTD  Urine CX - Enterococcus (pending sensitivies)  Imaging/Diagnostic Tests: CXR - + for R LL infiltrate  Andrena Mews, DO 03/11/2013, 8:48 AM PGY-3, [facility] FPTS Intern pager: [phone], text pages welcome

## 2013-03-11 NOTE — Consult Note (Signed)
CARDIOLOGY CONSULT NOTE   Patient ID: Mauro Arps MRN: [id number] DOB/AGE: February 23, 1919 79 y.o.  Admit Date: 03/10/2013 Referring Physician: Family Practice Resident Primary Physician: Barbaraann Barthel, MD Consulting Cardiologist: [place], Peter Primary Cardiologist: Hillis Range Reason for Consultation: Questionable Pacemaker failure  Clinical Summary Mr. [name]  is a 79 y.o.male admitted with urinary tract infection, found to have also pneumonia. He has a history of hypertension and BPH and a pacemaker. The patient had been seen in the past by Dr. Hillis Range to follow Biotronic Dual chamber pacemaker,PPM Serial Number: [id number] implanted on 03/25/2009 in the setting of second degree A-V heart block. His last seen by Dr. Johney Frame on 03/13/2010. Unfortunately, there are no further notes on followup concerning pacemaker interrogations. The patient discussed ", and family members do not know when he had his pacemaker checked last. A daughter who is not at bedside take him to his appointments.   Today the patient was sitting on the bedside chair became diaphoretic nauseated,dizzy and have change in level of consciousness, became stiff and had convulsions. Telemetry strip revealed artifact, and possible non-capture of pacemaker. Her asked for further recommendations and pacemaker interrogation. Family states that he has had these episodes in the past, but not after pacemaker implantation. The family interprets for him, as he does not speak [place]. On questioning through an interpreter, the patient does not remember the episode. He denies any headache dizziness chest pain or shortness of breath now or prior to the episode.    No Known Allergies  Medications Scheduled Medications: . ampicillin (OMNIPEN) IV  1 g Intravenous Q6H  . aspirin EC  81 mg Oral Daily  . azithromycin  250 mg Oral Daily  . calcium-vitamin D  1 tablet Oral Daily  . cefTRIAXone (ROCEPHIN)  IV  1 g  Intravenous Q24H  . docusate sodium  100 mg Oral BID  . enoxaparin (LOVENOX) injection  30 mg Subcutaneous Q24H  . feeding supplement  1 Container Oral BID BM  . omega-3 acid ethyl esters  1 capsule Oral BID  . pantoprazole  40 mg Oral Daily  . simvastatin  5 mg Oral q1800  . sodium chloride  3 mL Intravenous Q12H  . tamsulosin  0.4 mg Oral QHS     Infusions: . sodium chloride 10 mL/hr (03/11/13 1235)     PRN Medications:  acetaminophen, acetaminophen, polyethylene glycol   Past Medical History  Diagnosis Date  . Pacemaker   . Hypertension   . GERD (gastroesophageal reflux disease)   . H/O hiatal hernia   . Arthritis     hands and feet    Past Surgical History  Procedure Laterality Date  . Testicle surgery  1933  . Inguinal hernia repair  1980  . Pacemaker placement  03/2009    syncope - bradycardia, and 2nd degree block   . Hernia repair    . Esophagogastroduodenoscopy  01/29/2012    Procedure: ESOPHAGOGASTRODUODENOSCOPY (EGD);  Surgeon: Barrie Folk, MD;  Location[facility] END[facility]COPY;  Service: Endoscopy;  Laterality: N/A;  Intrepreter scheduled 930-12.    Social History Mr. Clayburn Weekly reports that he has quit smoking. He does not have any smokeless tobacco history on file. Mr. Emersyn Kotarski reports that he does not drink alcohol.  Review of Systems Otherwise reviewed and negative except as outlined.  Physical Examination Blood pressure 129/67, pulse 67, temperature 98.1 F (36.7 C), temperature source Oral, resp. rate 18, height 5\' 4"  (1.626 m), weight 139 lb 14.4 oz (  63.458 kg), SpO2 95.00%.  Intake/Output Summary (Last 24 hours) at 03/11/13 1623 Last data filed at 03/11/13 1417  Gross per 24 hour  Intake 1497.5 ml  Output    601 ml  Net  896.5 ml    Telemetry:  HEENT: Conjunctiva and lids normal, oropharynx clear with moist mucosa. Neck: Supple, no elevated JVP or carotid bruits, no thyromegaly. Lungs: Crackles in the left base.   Cardiac: Regular rate and rhythm, no S3 or significant systolic murmur, no pericardial rub. Abdomen: Soft, nontender, no hepatomegaly, bowel sounds present, no guarding or rebound. Extremities: No pitting edema, distal pulses 2+. Skin: Warm and dry. Musculoskeletal: No kyphosis. Neuropsychiatric: Alert and oriented x3, very HOH, affect grossly appropriate.  Prior Cardiac Testing/Procedures  1. Pacemaker interrogation Parameters  Mode: DDD-CLS Lower Rate Limit: 60 Upper Rate Limit: 130  Paced AV Delay: 300 Next Cardiology Appt Due: 09/08/2010  Tech Comments: CHECKED BY INDUSTRY--NORMAL DEVICE FUNCTION. ROV IN 6 MTHS W/DEVICE CLINIC. Vella Kohler March 13, 2010 4:08 PM  MD Comments: agree  paced AV extended to from to promote intrinsic conduction. He has intrinsic conduction today with PR of 260. If worsening symptoms, we will return AV delay to .    Lab Results  Basic Metabolic Panel:  Recent Labs Lab 03/10/13 1220 03/11/13 0550  NA 126* 131*  K 4.0 3.9  CL 93* 96  CO2  --  22  GLUCOSE 150* 108*  BUN 11 10  CREATININE 1.40* 0.92  CALCIUM  --  8.1*    CBC:  Recent Labs Lab 03/10/13 1143 03/10/13 1220 03/11/13 0550  WBC 14.1*  --  9.4  NEUTROABS 12.7*  --   --   HGB 13.2 13.6 12.5*  HCT 36.0* 40.0 34.9*  MCV 88.9  --  89.0  PLT 221  --  226    Radiology: Dg Chest Port 1 View  03/10/2013   CLINICAL DATA:  Chest pain with weakness and dizziness.  EXAM: PORTABLE CHEST - 1 VIEW  COMPARISON:  06/29/2012  FINDINGS: 1458 hours. Interval development of airspace disease in the right lung base. Left lung is clear. Cardiopericardial silhouette is at upper limits of normal for size. Left 2 lead permanent pacemaker is again noted. Telemetry leads overlie the chest.  IMPRESSION: New airspace disease at the right lung base suggests pneumonia.   Electronically Signed   By: Kennith Center M.D.   On: 03/10/2013 15:08     ECG: AV paced.    Impression  and Recommendations  1. S/P Biotronic Dual Chamber Pacmaker in 2010: Has not been interrogated since 2011, at that time Dr. Johney Frame increasing AV delay. This was placed in the setting of syncope in the setting of bradycardia and second-degree AV block. I have, [employer] representative to interrogate his pacemaker sometime this evening or first thing in the morning.  2.Syncope: Sudden onset while sitting on a bedside chair. Patient began have diaphoresis and nausea, and became very stiff and had his eyes rolled back in his head. It is noted that patient is mildly hyponatremic. We will recheck BMETt today. Question TIA. Patient does not remember episode.  3. Hypertension. Currently blood pressure is well-controlled. He is not on any antihypertensive medications at at this time.   4.Pneumonia: Per primary team  5. UTI: Per primary team.    Signed: Bettey Mare. Lyman Bishop NP [facility] 03/11/2013, 4:23 PM Co-Sign MD  As above, patient seen and examined. Briefly he is a 79 year old male who  I am asked to evaluate for syncope. He does not speak english and history is obtained with the assistance of a family member. The patient does have a history of pacemaker placement but has had no followup since 2011. Note his LV function in 2010 was normal. He remains quite active and denies dyspnea on exertion, orthopnea, PND, pedal edema, exertional chest pain or palpitations. He was admitted earlier today for treatment of a urinary tract infection and pneumonia. He was sitting in his chair and developed nausea followed by vomiting. He then had syncope with seizure activity. No preceding palpitations, chest pain or dyspnea. He now feels weak but is otherwise without complaints. I have reviewed telemetry strips and this demonstrates ventricular pacing. There were no pauses during the event. Interrogation of his device is normal. Etiology of event not clear but not related to rhythm disturbance based on review  of telemetry. Possible vagal episode with vomiting versus orthostasis with Flomax (may need to be Dced). Patient does not need further cardiac evaluation. Would ask him to followup with Dr. Johney Frame in the future for pacemaker checks. Please call with questions. Olga Millers

## 2013-03-12 DIAGNOSIS — R42 Dizziness and giddiness: Secondary | ICD-10-CM | POA: Diagnosis not present

## 2013-03-12 DIAGNOSIS — J189 Pneumonia, unspecified organism: Secondary | ICD-10-CM | POA: Diagnosis not present

## 2013-03-12 DIAGNOSIS — N390 Urinary tract infection, site not specified: Secondary | ICD-10-CM | POA: Diagnosis not present

## 2013-03-12 LAB — URINE CULTURE

## 2013-03-12 LAB — BASIC METABOLIC PANEL
BUN: 8 mg/dL (ref 6–23)
CO2: 24 mEq/L (ref 19–32)
Chloride: 94 mEq/L — ABNORMAL LOW (ref 96–112)
Creatinine, Ser: 0.82 mg/dL (ref 0.50–1.35)
GFR calc Af Amer: 86 mL/min — ABNORMAL LOW (ref >90)
Potassium: 3.2 mEq/L — ABNORMAL LOW (ref 3.5–5.1)
Sodium: 130 mEq/L — ABNORMAL LOW (ref 135–145)

## 2013-03-12 LAB — TROPONIN I: Troponin I: <0.3 ng/mL (ref <0.30)

## 2013-03-12 LAB — PSA: PSA: 35.04 ng/mL — ABNORMAL HIGH (ref <=4.00)

## 2013-03-12 MED ORDER — POTASSIUM CHLORIDE CRYS ER 20 MEQ PO TBCR
40.0000 meq | EXTENDED_RELEASE_TABLET | Freq: Once | ORAL | Status: AC
  Administered 2013-03-12: 10:00:00 40 meq via ORAL
  Filled 2013-03-12: qty 2

## 2013-03-12 MED ORDER — SILODOSIN 4 MG PO CAPS
4.0000 mg | ORAL_CAPSULE | Freq: Every day | ORAL | Status: DC
  Administered 2013-03-12 – 2013-03-13 (×2): 4 mg via ORAL
  Filled 2013-03-12 (×3): qty 1

## 2013-03-12 MED ORDER — HALOPERIDOL LACTATE 5 MG/ML IJ SOLN
1.0000 mg | Freq: Once | INTRAMUSCULAR | Status: AC
  Administered 2013-03-12: 1 mg via INTRAVENOUS
  Filled 2013-03-12: qty 1

## 2013-03-12 MED ORDER — LEVOFLOXACIN 500 MG PO TABS
500.0000 mg | ORAL_TABLET | ORAL | Status: DC
  Administered 2013-03-12: 21:00:00 500 mg via ORAL
  Filled 2013-03-12 (×2): qty 1

## 2013-03-12 MED ORDER — IBUPROFEN 400 MG PO TABS
400.0000 mg | ORAL_TABLET | Freq: Four times a day (QID) | ORAL | Status: DC | PRN
  Administered 2013-03-12: 400 mg via ORAL
  Filled 2013-03-12: qty 1

## 2013-03-12 MED ORDER — SILODOSIN 8 MG PO CAPS
8.0000 mg | ORAL_CAPSULE | Freq: Every day | ORAL | Status: DC
  Filled 2013-03-12 (×2): qty 1

## 2013-03-12 MED ORDER — FINASTERIDE 5 MG PO TABS
5.0000 mg | ORAL_TABLET | Freq: Every day | ORAL | Status: DC
  Administered 2013-03-12 – 2013-03-13 (×2): 5 mg via ORAL
  Filled 2013-03-12 (×2): qty 1

## 2013-03-12 MED ORDER — NON FORMULARY: 8.0000 mg | Freq: Every morning | Status: DC

## 2013-03-12 NOTE — Progress Notes (Signed)
FMTS Attending Admission Note: Allen Gores,MD I  have seen and examined this patient, reviewed their chart. I have discussed this patient with the resident. I agree with the resident's findings, assessment and care plan.  

## 2013-03-13 DIAGNOSIS — R339 Retention of urine, unspecified: Secondary | ICD-10-CM | POA: Diagnosis not present

## 2013-03-13 DIAGNOSIS — N390 Urinary tract infection, site not specified: Secondary | ICD-10-CM | POA: Diagnosis not present

## 2013-03-13 DIAGNOSIS — I10 Essential (primary) hypertension: Secondary | ICD-10-CM

## 2013-03-13 DIAGNOSIS — R55 Syncope and collapse: Secondary | ICD-10-CM | POA: Diagnosis not present

## 2013-03-13 DIAGNOSIS — N401 Enlarged prostate with lower urinary tract symptoms: Secondary | ICD-10-CM | POA: Diagnosis not present

## 2013-03-13 MED ORDER — SILODOSIN 4 MG PO CAPS: 4.0000 mg | ORAL_CAPSULE | Freq: Every day | ORAL | Status: DC

## 2013-03-13 MED ORDER — LEVOFLOXACIN 500 MG PO TABS: 500.0000 mg | ORAL_TABLET | ORAL | Status: DC

## 2013-03-13 MED ORDER — FINASTERIDE 5 MG PO TABS: 5.0000 mg | ORAL_TABLET | Freq: Every day | ORAL | Status: DC

## 2013-03-13 NOTE — Progress Notes (Signed)
FMTS Attending Note  I personally saw and evaluated the patient. The plan of care was discussed with the resident team. I agree with the assessment and plan as documented by the resident.   1. UTI - complete 7 day course of Levaquin as outpatient 2. BPH/Urine retention - voiding trial today, if passes OK to send home on Rapaflo, if fails voiding trial will need to go home with catheter and close Urology follow up, PSA elevation likely related to trauma, will need repeat PSA as outpatient  3. CAP - improved on abx.  Disposition: home pending voiding trial  Donnella Sham MD

## 2013-03-13 NOTE — Progress Notes (Signed)
Foley removed at 1152. Blood-tinged urine was in the tubing prior to removal. Urine in the bag was yellow, so this was recent.

## 2013-03-13 NOTE — Discharge Summary (Signed)
[facility] Discharge Summary  Patient name: Allen [name] Medical record number: [id number] Date of birth: 19-Jan-1919 Age: 79 y.o. Gender: male Date of Admission: 03/10/2013  Date of Discharge: 03/13/13 Admitting Physician: Janit Pagan, MD  Primary Care Provider: Barbaraann Barthel, MD Consultants: Cardiology   Indication for Hospitalization: dysuria  Discharge Diagnoses/Problem List:  UTI Community acquired PNA Syncope  BPH Implanted Bichamber pacemaker Urinary incontinence   Disposition: stable   Discharge Condition: home   Brief Hospital Course: Allen [name] is a 79 y.o. male presenting with weakness and dysuria found to have UTI and pneumonia. PMH is significant for HTN, pacemaker, and BPH.   Urinary Tract Infection with dysuria: Month long history of dysuria. Urinalysis showed large leukocytes, nitrites and many WBC. Initially started on rocephin but when urine cultures from clinic resulted Enterococcus, ampicillin was started. Sensitivities resulted so Levaquin was started for a course of seven days. Blood cultures from the hospital were collected after one dose of vancomycin had no growth.   Syncope: Pt reportedly was sitting in bedside chair, became diaphoretic, nauseated, dizzy then proceeded to have LOC accompanied by convulsions. No prior hx of seizure activity but this did seem to be similar to his prior episodes of syncope that was associated with his heart block and have been relieved by his dual chamber pacer. He reports being weak at this time and his urinary symptoms are slowing improving. DenieD any chest pain, dyspnea, or orthpnea. Pt did receive Flomax prior to the episode. He was previously on this medication however it was stopped for unknown reasons, now presumably orthostasis.  TELE strip reviewed and at the time of the event there was no captured pacing evident on telemetry however significant artifact.  Cardiac enzymes and carotid doppler were negative.  Has follow up with cardiology to evaluate pacer.   BPH: After the incident with the syncope, the patient retained greater than 150 cc as a post void volume so a foley was placed. Urology was called and they suggested Finasteride and Rapaflo since he was intolerant to flomax. Foley was discontinued and patient voided with only 77cc postvoid residual, so was discharged with no foley catheter and return precautions reviewed regarding urinary retention symptoms.  PSA resulted high at 35 which was most likely due to be traumatic from the placement of the foley. A follow up with Urology was scheduled for this reason.   CAP: Chest xray showed infiltrate for which he didn't have any repiratory complaints and continued to saturate well on room air. He was started on rocephin and azithromycin. Due to the urine cultures growing enterococcus, Levaquin was used for 7 days to cover the CAP and UTI.    Dehydration: Had a mildly elevated creatinine 1.40 at admission from a baseline of 1.0-1.1. Resolved with gently fluid resuscitation.    Hyponatremia: Sodium was low at 126 at admission. Stabilized prior to discharge.     HTN & CV Risk : There was no indication for intervention. Continued on low potency statin.     S/P Biotronic Dual Chamber Pacmaker in 2010: Has not been interrogated since 2011, at that time Dr. Johney Frame increasing AV delay. This was placed in the setting of syncope in the setting of bradycardia and second-degree AV block.  [employer] representative was sent to interrogate his pacemaker.   Issues for Follow Up:  1. BPH - started on finasteride and rapiflo. Foley discontinued at d/c and voided with 77cc postvoid residual . Urology follow up. PSA  35.  2. Elevated TSH - could be acutely high from being ill - recheck outpatient 3. UTI and CAP - received Levaquin 500 mg for 7 days.  4. Follow up with outpatient cardiology to evaluate pacer    Significant Procedures: Carotid dopplers.   Significant Labs and Imaging:   Recent Labs Lab 03/10/13 1143 03/10/13 1220 03/11/13 0550  WBC 14.1*  --  9.4  HGB 13.2 13.6 12.5*  HCT 36.0* 40.0 34.9*  PLT 221  --  226    Recent Labs Lab 03/10/13 1220 03/11/13 0550 03/12/13 0220  NA 126* 131* 130*  K 4.0 3.9 3.2*  CL 93* 96 94*  CO2  --  22 24  GLUCOSE 150* 108* 113*  BUN 11 10 8   CREATININE 1.40* 0.92 0.82  CALCIUM  --  8.1* 8.3*      Results/Tests Pending at Time of Discharge: none   Discharge Medications:    Medication List    STOP taking these medications       ciprofloxacin 500 MG tablet  Commonly known as:  CIPRO     gabapentin 600 MG tablet  Commonly known as:  NEURONTIN      TAKE these medications       aspirin EC 81 MG tablet  Take 81 mg by mouth daily.     CALCIUM-VITAMIN D PO  Take 1 capsule by mouth daily.     feeding supplement (RESOURCE BREEZE) Liqd  Take 1 Container by mouth 2 (two) times daily between meals.     finasteride 5 MG tablet  Commonly known as:  PROSCAR  Take 1 tablet (5 mg total) by mouth daily.     Fish Oil Oil  Take 1 capsule by mouth 3 (three) times daily.     levofloxacin 500 MG tablet  Commonly known as:  LEVAQUIN  Take 1 tablet (500 mg total) by mouth daily.     omeprazole 20 MG capsule  Commonly known as:  PRILOSEC  Take 20 mg by mouth 2 (two) times daily.     pravastatin 10 MG tablet  Commonly known as:  PRAVACHOL  Take 10 mg by mouth daily.     silodosin 4 MG Caps capsule  Commonly known as:  RAPAFLO  Take 1 capsule (4 mg total) by mouth daily with breakfast.        Discharge Instructions: Please refer to Patient Instructions section of EMR for full details.  Patient was counseled important signs and symptoms that should prompt return to medical care, changes in medications, dietary instructions, activity restrictions, and follow up appointments.   Follow-Up Appointments: Follow-up Information    Follow up with Sebastian Ache, MD On 03/27/2013. (10:30 AM for urology appointment - please arrive 15 minutes early)    Specialty:  Urology   Contact information:   [address] [place] [address] [phone]       Follow up with Randal Buba, MD On 03/17/2013. (9:00 for hospital follow up - please arrive 15 minutes early)    Specialty:  Family Medicine   Contact information:   [address] [place] [address] [phone]       Follow up with Rick Duff, PA-C On 03/24/2013. (10:30 AM - please arrive 15 minutes early)    Specialty:  Cardiology   Contact information:   [address] [place] [address] [phone]       Clare Gandy, MD 03/14/2013, 1:31 PM PGY-1, [facility]

## 2013-03-13 NOTE — Progress Notes (Signed)
Patient discharge teaching given, including activity, diet, follow-up appoints, and medications. Patient verbalized understanding of all discharge instructions. IV access was d/c'd. Vitals are stable. Skin is intact except as charted in most recent assessments. Pt to be escorted out by NT, to be driven home by family. 

## 2013-03-13 NOTE — Progress Notes (Signed)
*  PRELIMINARY RESULTS* Vascular Ultrasound Carotid Duplex (Doppler) has been completed.  Findings suggest 1-39% internal carotid artery stenosis bilaterally. Vertebral arteries are patent with antegrade flow.  03/13/2013 11:16 AM Gertie Fey, RVT, RDCS, RDMS

## 2013-03-13 NOTE — Progress Notes (Signed)
Family Medicine Teaching Service Daily Progress Note Intern Pager: [phone]  Patient name: Allen [name] Medical record number: [id number] Date of birth: 1918/12/02 Age: 79 y.o. Gender: male  Primary Care Provider: Barbaraann Barthel, MD Consultants:  Code Status: FULL  Pt Overview and Major Events to Date:  10/3 - Seen in clinic with UTI - +enterococcus (no sensitives at this time) 10/3 - Admitted through ED due to worsening dysuria 10/4 - urinary retention postvoid residual.  Syncopal episode with Flomax.  Foley placed  Assessment and Plan: Allen [name] is a 79 y.o. male presenting with weakness and dysuria found to have UTI and pneumonia. PMH is significant for HTN, pacemaker, and BPH.   Urinary Tract Infection with dysuria: Month-long history of dysuria with history of BPH concerning  + UTI with enterococcus positive culture from 10/3 - blood cultures show NGTD  - Levaquin 10/5>for 7 days. Sensitive  - Will add Ibuprofen for anti-inflammatory effect  BPH: No current medical treatment at home - Currently with foley placed --> d/c to initiate voiding trial  -Discussed with Dr. Berneice Heinrich with Urology who recommended starting Finasteride, and Rapaflo if able while hospitalized given no significant orthostasis has been noted with Rapaflo.   - Voiding trial this AM. If fails voiding trial then will discharge with foley  - follow up set up with Urology  - PSA 35, could be elevated due to placement of the foley   CA-Pneumonia: No resp complaints currently Rales at right lung base, saturating well. New RLL infiltrate on CXR. s/p vancomycin 1g in ED - On Rocephin 10/4> - azithromycin 10/3>10/5 - will need treatment for a minimum of 5 days.  - Monitor oxygenation status, O2 prn  Dehydration: Cr: 1.40 at admission, Resolved today. Apparent creatinine baseline 1.0-1.1  Hyponatremia: Na: 126 at admission, currently stable ~ baseline of 130  Dizziness.  Improved again  today.  Syncopal episode yesterday.  Cardiology agree's likely neurogenic syncope.   - FLOMAX added to "allergy list" - watch with addition of Rapaflo but urology confident that should not cause syncope -  PT/OT denies needs at home - Carotid dopplers today for completeness but low likelihood of neurologic event causing symptoms  HTN & CV Risk- Not on antihypertensive at home. - no indication for intervention  - On low potency Statin; continue at this time, no prior significant atherosclerotic disease - TSH elevated at 7.214 - Continue ASA 81  Implanted Bichamber pacemaker:  [ ]  Needs OP follow up with EP  Constipation: RESOLVED  FEN/GI: regular diet, KVO fluids  Prophylaxis: Lovenox 30mg , continue PPI  Disposition: Pending cultures  Subjective: Patient sitting up in bed. He slept through the night after having an episode of agitation last night. He received a dose of Haldol and didn't have any further events. He has not had any more feelings of lightheaded or syncope.   Objective: Temp:  [97.8 F (36.6 C)-98.3 F (36.8 C)] 97.8 F (36.6 C) (10/06 0503) Pulse Rate:  [68-71] 68 (10/06 0503) Resp:  [18] 18 (10/06 0503) BP: (132-150)/(71-84) 132/71 mmHg (10/06 0503) SpO2:  [95 %-96 %] 96 % (10/06 0503) Weight:  [142 lb 3.2 oz (64.501 kg)] 142 lb 3.2 oz (64.501 kg) (10/06 0503) Physical Exam: PHYSICAL EXAM: GENERAL: Adult hispanic  male. In no discomfort; no respiratory distress  PSYCH: alert and appropriate, good insight   HNEENT: MMM  CARDIO: RRR, S1/S2 heard, no murmur  LUNGS: Clear breath sounds  ABDOMEN: Soft non-tender, no superpubic tenderness  EXTREM:  Warm, well perfused.  Moves all 4 extremities spontaneously; no lateralization.  No noted foot lesions.  Distal pulses intact.  trace pretibial edema.  GU: Foley in place, no red sediment in bag   SKIN:     Laboratory:  Recent Labs Lab 03/10/13 1143 03/10/13 1220 03/11/13 0550  WBC 14.1*  --  9.4  HGB 13.2  13.6 12.5*  HCT 36.0* 40.0 34.9*  PLT 221  --  226    Recent Labs Lab 03/10/13 1220 03/11/13 0550 03/12/13 0220  NA 126* 131* 130*  K 4.0 3.9 3.2*  CL 93* 96 94*  CO2  --  22 24  BUN 11 10 8   CREATININE 1.40* 0.92 0.82  CALCIUM  --  8.1* 8.3*  GLUCOSE 150* 108* 113*    Blood Cx - NGTD Urine CX - Enterococcus (pending sensitivies)  Imaging/Diagnostic Tests: CXR - + for R LL infiltrate  Clare Gandy, MD 03/13/2013, 9:57 AM PGY-3, [facility] FPTS Intern pager: [phone], text pages welcome

## 2013-03-15 NOTE — Discharge Summary (Signed)
I agree with the discharge summary as documented.   Advith Martine MD  

## 2013-03-17 ENCOUNTER — Ambulatory Visit (INDEPENDENT_AMBULATORY_CARE_PROVIDER_SITE_OTHER): Payer: Medicare Other | Admitting: Emergency Medicine

## 2013-03-17 ENCOUNTER — Encounter: Payer: Self-pay | Admitting: Emergency Medicine

## 2013-03-17 VITALS — BP 150/76 | HR 74 | Temp 98.3°F | Ht 63.0 in | Wt 135.0 lb

## 2013-03-17 DIAGNOSIS — N401 Enlarged prostate with lower urinary tract symptoms: Secondary | ICD-10-CM

## 2013-03-17 DIAGNOSIS — R946 Abnormal results of thyroid function studies: Secondary | ICD-10-CM | POA: Diagnosis not present

## 2013-03-17 DIAGNOSIS — Z950 Presence of cardiac pacemaker: Secondary | ICD-10-CM | POA: Diagnosis not present

## 2013-03-17 DIAGNOSIS — N138 Other obstructive and reflux uropathy: Secondary | ICD-10-CM

## 2013-03-17 DIAGNOSIS — R7989 Other specified abnormal findings of blood chemistry: Secondary | ICD-10-CM

## 2013-03-17 DIAGNOSIS — N390 Urinary tract infection, site not specified: Secondary | ICD-10-CM

## 2013-03-17 DIAGNOSIS — R6889 Other general symptoms and signs: Secondary | ICD-10-CM

## 2013-03-17 LAB — CULTURE, BLOOD (ROUTINE X 2): Culture: NO GROWTH

## 2013-03-17 MED ORDER — DOCUSATE SODIUM 100 MG PO CAPS: 100.0000 mg | ORAL_CAPSULE | Freq: Every day | ORAL | Status: DC | PRN

## 2013-03-17 NOTE — Assessment & Plan Note (Signed)
Will repeat TSH in 4 weeks.

## 2013-03-17 NOTE — Assessment & Plan Note (Signed)
Responding appropriately to the antibiotics. Still with decreased energy and appetite. Recommended completion of antibiotics. Ensure or Boost 2-3x day.  Follow up with PCP in 1 month to make sure the fatigue and decreased appetite are improving.

## 2013-03-17 NOTE — Assessment & Plan Note (Signed)
Symptoms improved with finasteride and rapaflo. Has appt with urology later this month. Continue rapaflo and finasteride.

## 2013-03-17 NOTE — Assessment & Plan Note (Signed)
Has appt with cardiology for interrogation later this month.

## 2013-03-17 NOTE — Progress Notes (Signed)
  Subjective:    Patient ID: Allen [name], male    DOB: [birth year], 79 y.o.   MRN: [id number]  HPI Welcome Fults is here for hospital follow up.  Daughter is present and acted as interpreter for this visit and her request.  History mostly obtained from the daughter.  The patient was admitted from 10/3 to 10/6 for complicated UTI and CAP.  He was discharge on Levaquin based on sensitivities from the urine.  Since discharge, he is slowly improving.  His daughter states that he is still more fatigued than normal and that his appetite is decreased.  He complains of some right sided low back pain that really started in the hospital stay.  Described as achy.  Also having some constipation.  He reports tolerating the Levaquin well.  Also started on Rapaflo and finasteride for BPH with obstruction.  He reports some improvement in his urine stream.  Daughter states that he has an appointment with his cardiologist and a urologist later this month.  Denies any bony pain.  I have reviewed and updated the following as appropriate: allergies and current medications SHx: former smoker   Review of Systems See HPI    Objective:   Physical Exam BP 150/76  Pulse 74  Temp(Src) 98.3 F (36.8 C) (Oral)  Ht 5\' 3"  (1.6 m)  Wt 135 lb (61.236 kg)  BMI 23.92 kg/m2 Gen: alert, cooperative, NAD HEENT: AT/[place], sclera white, MMM Neck: supple CV: RRR, no murmurs Pulm: CTAB, no wheezes or rales Ext: no edema Back: no point tenderness; negative seated SLR bilaterally      Assessment & Plan:

## 2013-03-17 NOTE — Patient Instructions (Signed)
It was nice to meet you!  It looks like things are slowly getting better. We did not change any medicines today.  Please keep your appointments with the cardiologist and urologist later this month.  For the back, you can try tylenol and a heating pad.  I sent in a medicine called colace - it will help with constipation.  Take it once a day as needed.  Come back in 4 weeks for a lab draw to recheck your thyroid.  If you are still feeling more fatigued that normal in 1 month, come back.  Otherwise, follow up with Dr. Mauricio Po as scheduled.

## 2013-03-24 ENCOUNTER — Encounter: Payer: Self-pay | Admitting: Cardiology

## 2013-03-24 ENCOUNTER — Ambulatory Visit (INDEPENDENT_AMBULATORY_CARE_PROVIDER_SITE_OTHER): Payer: Medicare Other | Admitting: Cardiology

## 2013-03-24 VITALS — BP 142/84 | HR 72 | Wt 134.0 lb

## 2013-03-24 DIAGNOSIS — Z950 Presence of cardiac pacemaker: Secondary | ICD-10-CM | POA: Diagnosis not present

## 2013-03-24 DIAGNOSIS — I498 Other specified cardiac arrhythmias: Secondary | ICD-10-CM | POA: Diagnosis not present

## 2013-03-24 DIAGNOSIS — R001 Bradycardia, unspecified: Secondary | ICD-10-CM

## 2013-03-24 DIAGNOSIS — I441 Atrioventricular block, second degree: Secondary | ICD-10-CM

## 2013-03-24 LAB — PACEMAKER DEVICE OBSERVATION
AL AMPLITUDE: 6.4 mv
DEVICE MODEL PM: 76135480
RV LEAD AMPLITUDE: 12.8 mv

## 2013-03-24 NOTE — Patient Instructions (Addendum)
Your physician wants you to follow-up in: 6 months with the [facility].You will receive a reminder letter in the mail two months in advance. If you don't receive a letter, please call our office to schedule the follow-up appointment.  Your physician wants you to follow-up in: 1 year with DR. Allred You will receive a reminder letter in the mail two months in advance. If you don't receive a letter, please call our office to schedule the follow-up appointment.

## 2013-03-24 NOTE — Progress Notes (Signed)
ELECTROPHYSIOLOGY OFFICE NOTE  Patient ID: Allen [name] MRN: [id number], DOB/AGE: 08-30-1918   Date of Visit: 03/24/2013  Primary Physician: Paula Compton, MD Primary Cardiologist: Hillis Range, MD Reason for Visit: EP/device follow-up  History of Present Illness  Tara Wich is a 79 y.o. male with prior syncope with documented second degree AV block s/p PPM implant 2010 who presents today for routine electrophysiology followup. He has been lost to follow-up since October 2011. He is accompanied by his daughter who assists with history and translation as he speaks little [place]. He requested she translate for him.   Since last being seen in our clinic, he reports he is doing well and has no complaints. He denies chest pain or shortness of breath. He denies palpitations, dizziness, near syncope or syncope. He denies LE swelling, orthopnea, PND or recent weight gain. He is compliant with his medications.  Past Medical History Past Medical History  Diagnosis Date  . Pacemaker   . Hypertension   . GERD (gastroesophageal reflux disease)   . H/O hiatal hernia   . Arthritis     hands and feet    Past Surgical History Past Surgical History  Procedure Laterality Date  . Testicle surgery  1933  . Inguinal hernia repair  1980  . Pacemaker placement  03/2009    syncope - bradycardia, and 2nd degree block   . Hernia repair    . Esophagogastroduodenoscopy  01/29/2012    Procedure: ESOPHAGOGASTRODUODENOSCOPY (EGD);  Surgeon: Barrie Folk, MD;  Location: [facility] ENDOSCOPY;  Service: Endoscopy;  Laterality: N/A;  Intrepreter scheduled 930-12.    Allergies/Intolerances Allergies  Allergen Reactions  . Flomax [Tamsulosin Hcl] Other (See Comments)    Pt becomes dizzy and passes out.    Current Home Medications Current Outpatient Prescriptions  Medication Sig Dispense Refill  . aspirin EC 81 MG tablet Take 81 mg by mouth daily.        Marland Kitchen CALCIUM-VITAMIN D PO Take 1  capsule by mouth daily.      Marland Kitchen docusate sodium (COLACE) 100 MG capsule Take 1 capsule (100 mg total) by mouth daily as needed for constipation.  30 capsule  0  . finasteride (PROSCAR) 5 MG tablet Take 1 tablet (5 mg total) by mouth daily.  30 tablet  0  . Fish Oil OIL Take 1 capsule by mouth 3 (three) times daily.       Marland Kitchen levofloxacin (LEVAQUIN) 500 MG tablet Take 1 tablet (500 mg total) by mouth daily.  6 tablet  0  . omeprazole (PRILOSEC) 20 MG capsule Take 20 mg by mouth 2 (two) times daily.      . pravastatin (PRAVACHOL) 10 MG tablet Take 10 mg by mouth daily.      . silodosin (RAPAFLO) 4 MG CAPS capsule Take 1 capsule (4 mg total) by mouth daily with breakfast.  60 capsule  0   No current facility-administered medications for this visit.    Social History Social History  . Marital Status: Married   Social History Main Topics  . Smoking status: Former Games developer  . Smokeless tobacco: Not on file  . Alcohol Use: No  . Drug Use: No   Social History Narrative   Born in [place], lived in [place] until 2005   Retired farmer   Catholic faith   Sometimes stays with daughter in [place] state.    3 children in [place], lives with wife and son.   daugher, Jacky Kindle, is main caregiver.  Has been in [place] for about 1 year. Wife lives here in [place]. Smoked when he was younger for about 20 years (6-7 cigarettes/day).    Review of Systems General: No chills, fever, night sweats or weight changes Cardiovascular: No chest pain, dyspnea on exertion, edema, orthopnea, palpitations, paroxysmal nocturnal dyspnea Dermatological: No rash, lesions or masses Respiratory: No cough, dyspnea Urologic: No hematuria, dysuria Abdominal: No nausea, vomiting, diarrhea, bright red blood per rectum, melena, or hematemesis Neurologic: No visual changes, weakness, changes in mental status All other systems reviewed and are otherwise negative except as noted above.  Physical Exam Vitals:  Blood pressure 142/84, pulse 72, weight 134 lb (60.782 kg).  General: Well developed, well appearing 79 y.o. male in no acute distress. He appears younger than his stated age. HEENT: Normocephalic, atraumatic. EOMs intact. Sclera nonicteric. Oropharynx clear.  Neck: Supple. No JVD. Lungs: Respirations regular and unlabored, CTA bilaterally. No wheezes, rales or rhonchi. Heart: RRR. S1, S2 present. No murmurs, rub, S3 or S4. Abdomen: Soft, non-distended. Extremities: No clubbing, cyanosis or edema. PT/Radials 2+ and equal bilaterally. Psych: Normal affect. Neuro: Alert and oriented X 3. Moves all extremities spontaneously.   Diagnostics 12-lead ECGs today  #1 - with pacing, AV paced at 60 bpm; no ST-T wave abnormalities #2 - without pacing, SR with long first degree AV block at 63 bpm; PR 324, QRS 112, QT/QTc 468/476 msec; there are deep T wave inversions noted which is most likely related to T-wave memory from V pacing  Device interrogation today - Normal device function. Thresholds, sensing, impedances consistent with previous measurements. Device programmed to maximize longevity. 894 AT/AF episodes since 2011 (last check) - available EGMs show PACs and atrial tach. 1 NSVT episode, 5 beats. Device programmed at appropriate safety margins. Histogram distribution appropriate for patient activity level. Device programmed to optimize intrinsic conduction. Estimated longevity 3 years 11 months.  Assessment and Plan 1. Symptomatic bradycardia with second-degree AV block s/p PPM implant - Normal device function - No programming changes made - Return for follow-up in device clinic in 6 months and with Dr. Johney Frame in one year  Signed, Rick Duff, PA-C 03/24/2013, 11:37 AM

## 2013-03-28 DIAGNOSIS — N500 Atrophy of testis: Secondary | ICD-10-CM | POA: Diagnosis not present

## 2013-03-28 DIAGNOSIS — N138 Other obstructive and reflux uropathy: Secondary | ICD-10-CM | POA: Diagnosis not present

## 2013-03-28 DIAGNOSIS — N281 Cyst of kidney, acquired: Secondary | ICD-10-CM | POA: Diagnosis not present

## 2013-03-28 DIAGNOSIS — R972 Elevated prostate specific antigen [PSA]: Secondary | ICD-10-CM | POA: Diagnosis not present

## 2013-04-07 ENCOUNTER — Other Ambulatory Visit: Payer: Self-pay | Admitting: *Deleted

## 2013-04-07 NOTE — Addendum Note (Signed)
Addended by: Guillermina [place] A on: 04/07/2013 10:44 AM   Modules accepted: Orders

## 2013-04-18 ENCOUNTER — Encounter: Payer: Self-pay | Admitting: Family Medicine

## 2013-04-18 ENCOUNTER — Ambulatory Visit (INDEPENDENT_AMBULATORY_CARE_PROVIDER_SITE_OTHER): Payer: Medicare Other | Admitting: Family Medicine

## 2013-04-18 ENCOUNTER — Other Ambulatory Visit: Payer: Medicare Other

## 2013-04-18 VITALS — BP 136/82 | HR 72 | Ht 63.0 in | Wt 129.0 lb

## 2013-04-18 DIAGNOSIS — R946 Abnormal results of thyroid function studies: Secondary | ICD-10-CM

## 2013-04-18 DIAGNOSIS — R6889 Other general symptoms and signs: Secondary | ICD-10-CM | POA: Diagnosis not present

## 2013-04-18 DIAGNOSIS — I10 Essential (primary) hypertension: Secondary | ICD-10-CM | POA: Diagnosis not present

## 2013-04-18 DIAGNOSIS — R634 Abnormal weight loss: Secondary | ICD-10-CM | POA: Diagnosis not present

## 2013-04-18 DIAGNOSIS — R7989 Other specified abnormal findings of blood chemistry: Secondary | ICD-10-CM

## 2013-04-18 LAB — TSH: TSH: 4.457 u[IU]/mL (ref 0.350–4.500)

## 2013-04-18 MED ORDER — MIRTAZAPINE 7.5 MG PO TABS: 7.5000 mg | ORAL_TABLET | Freq: Every day | ORAL | Status: DC

## 2013-04-18 MED ORDER — SILODOSIN 8 MG PO CAPS: 8.0000 mg | ORAL_CAPSULE | Freq: Every day | ORAL | Status: DC

## 2013-04-18 MED ORDER — FINASTERIDE 5 MG PO TABS: 5.0000 mg | ORAL_TABLET | Freq: Every day | ORAL | Status: DC

## 2013-04-18 MED ORDER — FLUTICASONE PROPIONATE 50 MCG/ACT NA SUSP: 2.0000 | Freq: Every day | NASAL | Status: DC

## 2013-04-18 NOTE — Progress Notes (Signed)
TSH DONE TODAY. Allen [name].

## 2013-04-18 NOTE — Patient Instructions (Signed)
Fue un placer verle hoy; me alegro que el apetito esta' volviendo.  Para ayudarle con el apetito, estoy recetandole la Mirtazapina 7.5mg , una tableta por boca cada noche antes de dormir. Seguir vigilandole el peso en la casa (cada semana esta' bien).  Para los sintomas alergicos, Flonase 2 soplidos en cada fosa nasal en la manana.   Le llamo al hijo Alecia Lemming (tel [phone]) con los resultados de la tiroide.  FOLLOW UP 1 MONTH WITH DR Mauricio Po.

## 2013-04-19 ENCOUNTER — Telehealth: Payer: Self-pay | Admitting: Family Medicine

## 2013-04-19 ENCOUNTER — Encounter: Payer: Self-pay | Admitting: Family Medicine

## 2013-04-19 DIAGNOSIS — R634 Abnormal weight loss: Secondary | ICD-10-CM

## 2013-04-19 HISTORY — DX: Abnormal weight loss: R63.4

## 2013-04-19 NOTE — Assessment & Plan Note (Addendum)
To recheck TSH today, in setting of decreased appetite and intermittent dizziness/prior episodes of syncope.

## 2013-04-19 NOTE — Progress Notes (Signed)
  Subjective:    Patient ID: Allen [name], male    DOB: [birth year], 79 y.o.   MRN: [id number]  HPI Patient seen today for follow up, visit conducted in Spanish.  Daughter-in-law Rayburn Ma is primary historian.   Allen [name] had planned to travel to [place] with his wife recently, but the family decided not to send them because he has not been eating well and has been losing weight (about 12# in the past couple of months).  He reports a decreased appetite; has had nausea without emesis.  In the past week Rayburn Ma reports that he appears to be eating better.  The family is unsure if he might be depressed.    Seen by urology recently for BPH; had his Rapaflo dose increased from 4mg  to 8mg  one time daily with food in the mornings.  He denies dysuria, appears to be passing urine easily.  Seen by Cardiology for interrogation of pacer; told working properly. To continue with regular interval checks.    Had mildly abnormal TSH and is here for follow up labs for this.   Review of Systems  No chest pain, no fevers or chills. Has had cough in conjunction with nasal secretion (clear).  No abdominal pain.      Objective:   Physical Exam Alert, well appearing, hard of hearing.  No apparent distress.  HEENT neck supple. No cervical adenopathy.  COR regular S1S2 PULM Clear bilaterally.        Assessment & Plan:

## 2013-04-19 NOTE — Telephone Encounter (Signed)
I attempted to call patient's son Alecia Lemming (cell [phone]) at number given to me by patient's daughter-in-law (Victor's wife); number no longer in service.  I will send letter with the results of recent TSH.  Paula Compton, MD

## 2013-04-19 NOTE — Assessment & Plan Note (Signed)
Well controlled, no changes in management for the time being.

## 2013-04-20 ENCOUNTER — Encounter: Payer: Self-pay | Admitting: Internal Medicine

## 2013-04-24 ENCOUNTER — Telehealth: Payer: Self-pay

## 2013-04-24 NOTE — Telephone Encounter (Signed)
Medication that were sent to mail order pharmacy still have not arrived. Daughter in law would like meds sent to [facility] on [address].

## 2013-04-26 MED ORDER — SILODOSIN 8 MG PO CAPS: 8.0000 mg | ORAL_CAPSULE | Freq: Every day | ORAL | Status: DC

## 2013-04-26 MED ORDER — OMEPRAZOLE 20 MG PO CPDR: 20.0000 mg | DELAYED_RELEASE_CAPSULE | Freq: Two times a day (BID) | ORAL | Status: DC

## 2013-04-26 MED ORDER — MIRTAZAPINE 7.5 MG PO TABS: 7.5000 mg | ORAL_TABLET | Freq: Every day | ORAL | Status: AC

## 2013-04-26 MED ORDER — FINASTERIDE 5 MG PO TABS: 5.0000 mg | ORAL_TABLET | Freq: Every day | ORAL | Status: DC

## 2013-04-26 MED ORDER — FLUTICASONE PROPIONATE 50 MCG/ACT NA SUSP: 2.0000 | Freq: Every day | NASAL | Status: DC

## 2013-04-26 MED ORDER — PRAVASTATIN SODIUM 10 MG PO TABS: 10.0000 mg | ORAL_TABLET | Freq: Every day | ORAL | Status: DC

## 2013-04-26 NOTE — Telephone Encounter (Signed)
Rx sent for 90-day supplies to [facility]. JB

## 2013-05-19 ENCOUNTER — Encounter: Payer: Self-pay | Admitting: Family Medicine

## 2013-05-19 ENCOUNTER — Ambulatory Visit (INDEPENDENT_AMBULATORY_CARE_PROVIDER_SITE_OTHER): Payer: Medicare Other | Admitting: Family Medicine

## 2013-05-19 VITALS — BP 138/70 | HR 70 | Temp 97.8°F | Ht 63.0 in | Wt 128.0 lb

## 2013-05-19 DIAGNOSIS — Z23 Encounter for immunization: Secondary | ICD-10-CM | POA: Diagnosis not present

## 2013-05-19 DIAGNOSIS — R634 Abnormal weight loss: Secondary | ICD-10-CM | POA: Diagnosis not present

## 2013-05-19 DIAGNOSIS — H9190 Unspecified hearing loss, unspecified ear: Secondary | ICD-10-CM | POA: Diagnosis not present

## 2013-05-19 NOTE — Assessment & Plan Note (Signed)
Stabilized with Remeron.  Continue at current low dose. To see back when he returns from [place].

## 2013-05-19 NOTE — Patient Instructions (Signed)
Fue un Research officer, trade union.   Estoy remitiendole al audiologo para tratar de conseguir un nuevo aparato.  Judeth Horn y Feliz viaje!

## 2013-05-19 NOTE — Progress Notes (Signed)
   Subjective:    Patient ID: Allen [name], male    DOB: [birth year], 79 y.o.   MRN: [id number]  HPI Patient here with daughter-in-law and daughter, wife, for follow up. Visit conducted in Spanish.  Allen [name] has been eating better, affect improved as well according to family. Tolerating Remeron well.  No noticeable side effects.  Sleeps well.   Hearing aid broke recently. Has had it a number of years, prescribed in [place] state where he lived before moving to [place].  Has not been evaluated in [facility] by audiologist in the past.   Planning to travel to [place] to visit family on December 26th.  Looking forward to the trip.  Has all meds for 90 days.    Review of Systems     Objective:   Physical Exam Well appearing.  Markedly hard of hearing.  No apparent distress. Well nourished.  HEENT Neck supple. No cervical adenopathy. MMM.  COR regular S1S2 PULM Clear bilaterally        Assessment & Plan:

## 2013-05-19 NOTE — Assessment & Plan Note (Signed)
Hearing aid broken.  For evaluation by Audiologist, for new hearing aid. Referral completed today.

## 2013-07-14 ENCOUNTER — Telehealth: Payer: Self-pay | Admitting: Family Medicine

## 2013-07-14 ENCOUNTER — Other Ambulatory Visit: Payer: Self-pay | Admitting: Family Medicine

## 2013-07-14 MED ORDER — MIRTAZAPINE 7.5 MG PO TABS: 7.5000 mg | ORAL_TABLET | Freq: Every day | ORAL | Status: DC

## 2013-07-14 NOTE — Telephone Encounter (Signed)
Daughter called for her father who is [place]. She needs to pick up a prescription for her father early because she has to mail it to [place]. She would like someone to call her to discuss this asap. Myriam Jacobsonjw

## 2013-07-14 NOTE — Telephone Encounter (Signed)
Allen MiuBarbara [name] relayed message that patient's family called for refill on Mirtazepine  Patient is now in [place].  Plan to print prescription to allow them to pick up and fill at pharmacy of their choice before sending medicine to him in [place].  [place]

## 2013-07-14 NOTE — Telephone Encounter (Signed)
Called cell number (d/c) Called H #. LMVM to call back. Please ask, which prescriptions are needed. Thanks. Lorenda Hatchet.Tammara Massing, Renato Battleshekla

## 2013-07-16 IMAGING — CT CT ABD-PELV W/ CM
2 of 5 series · 16 of 46 positions shown, 18 images · IV contrast (APPLIED)
Comparison: Acute abdominal series 01/23/2012 and 10/04/2003.

CLINICAL DATA: Nausea with occasional vomiting for 3 weeks.
History of ventral hernia repair and BPH.  No history of
malignancy.

CT ABDOMEN AND PELVIS WITH CONTRAST
TECHNIQUE: Multidetector CT imaging of the abdomen and pelvis was
performed following the standard protocol during bolus
administration of intravenous contrast.
Contrast: 80mL OMNIPAQUE IOHEXOL 300 MG/ML  SOLN

[Series 2: abd/pelv with 5.0 b31f st · axial · 0.68mm/px · z∈[-445,-65]mm · 13 of 84 slices shown, 15 images]
[im 4/84  soft-tissue]
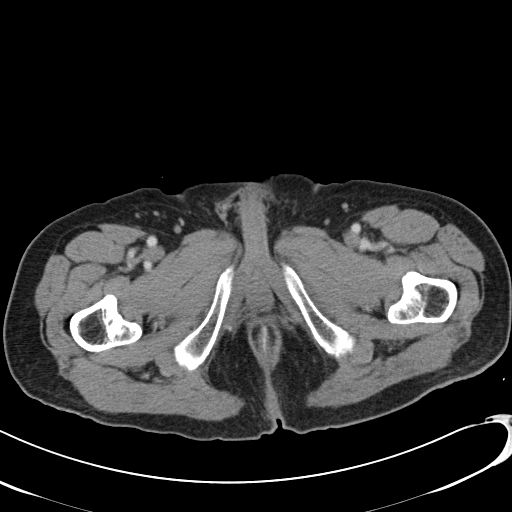
[im 4/84  bone]
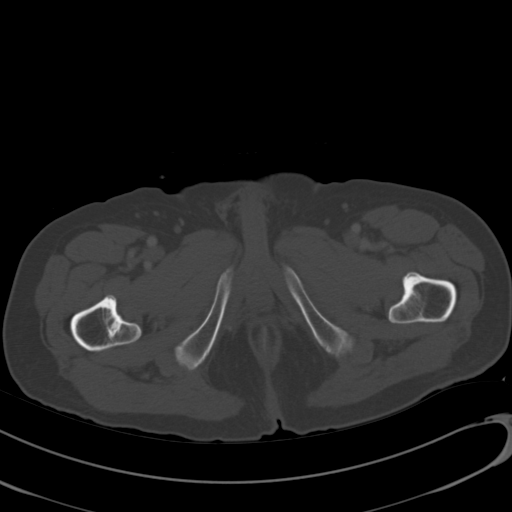
[im 12/84  soft-tissue]
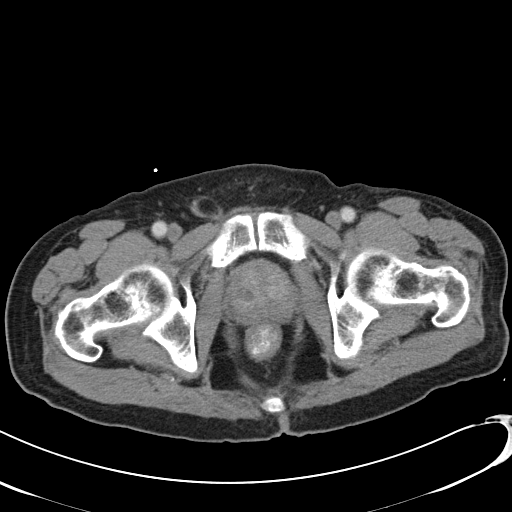
[im 16/84  soft-tissue]
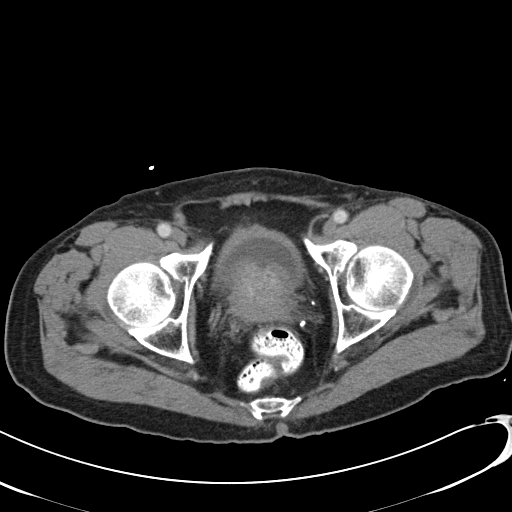
[im 24/84  soft-tissue]
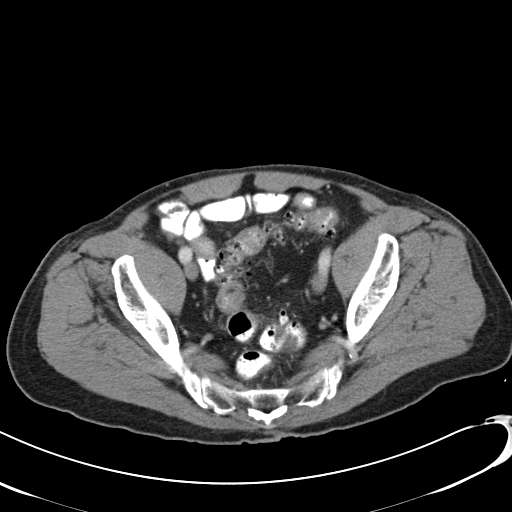
[im 28/84  soft-tissue]
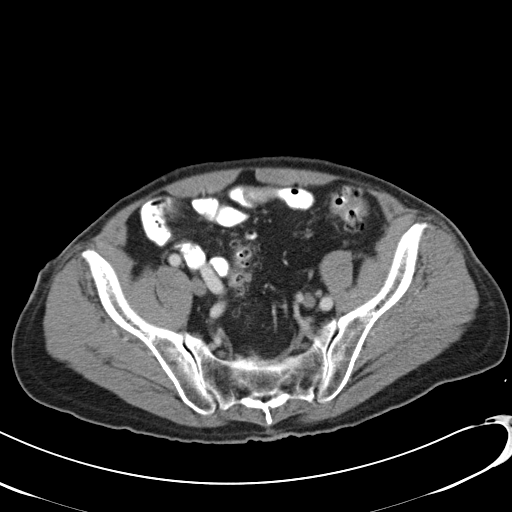
[im 36/84  soft-tissue]
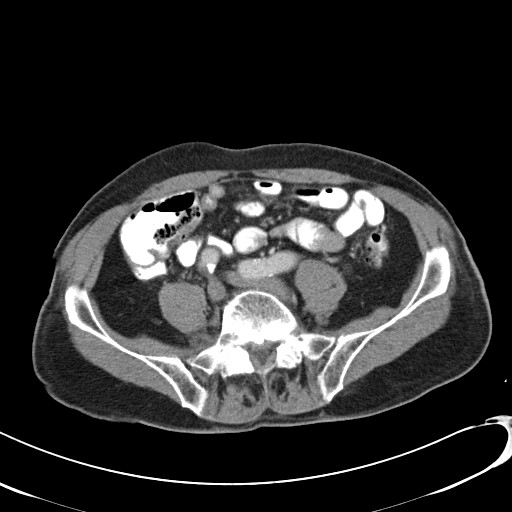
[im 44/84  soft-tissue]
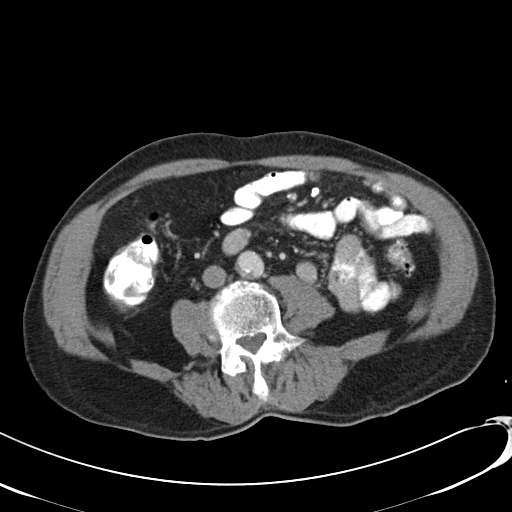
[im 48/84  soft-tissue]
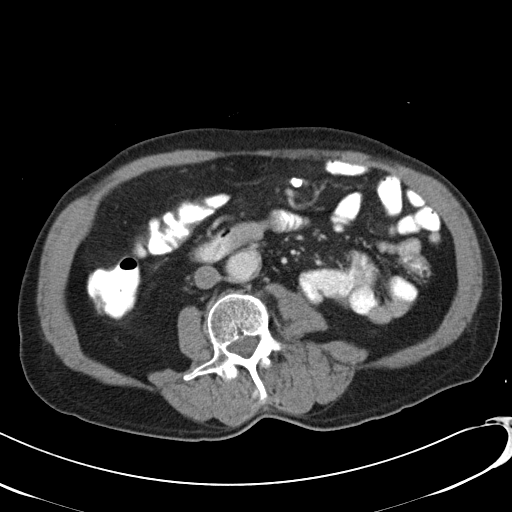
[im 56/84  soft-tissue]
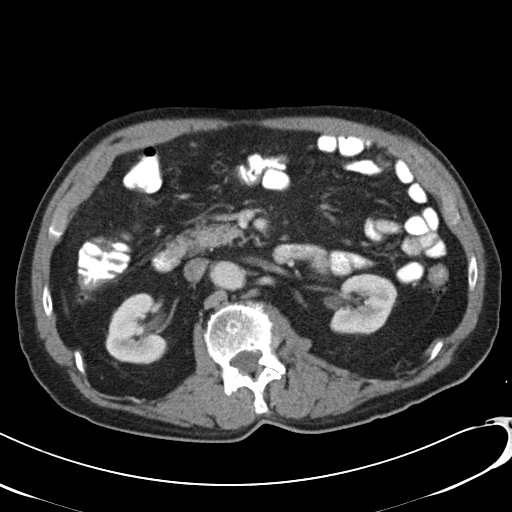
[im 56/84  bone]
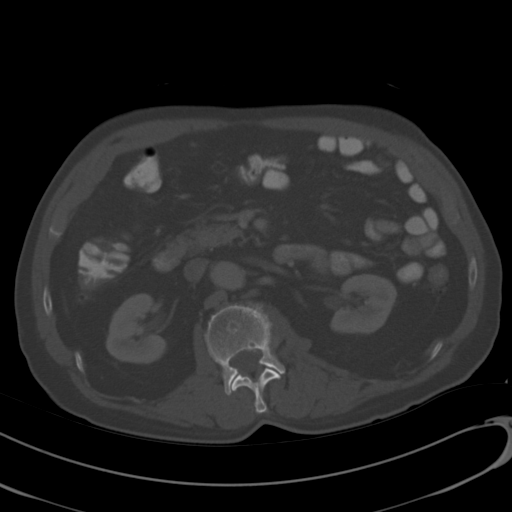
[im 60/84  soft-tissue]
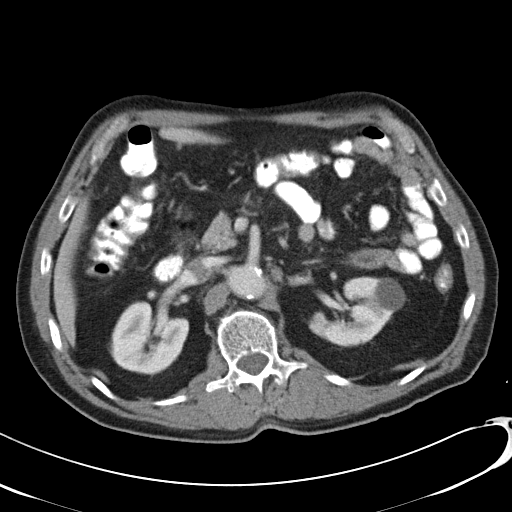
[im 68/84  soft-tissue]
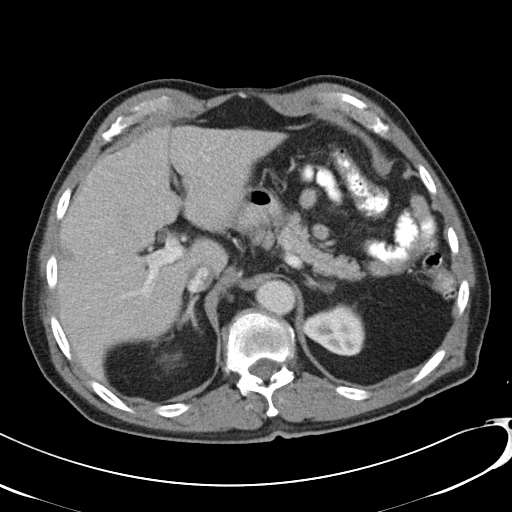
[im 72/84  soft-tissue]
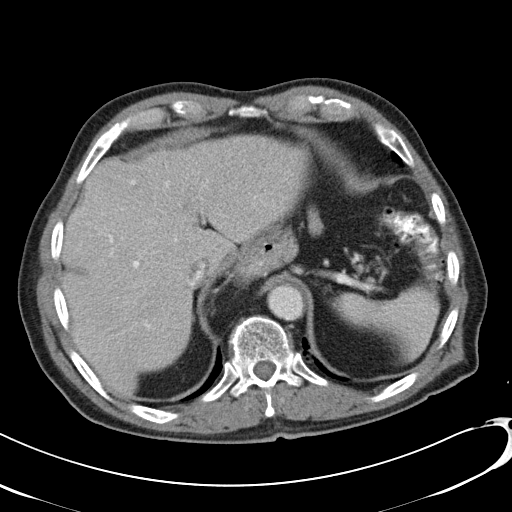
[im 80/84  soft-tissue]
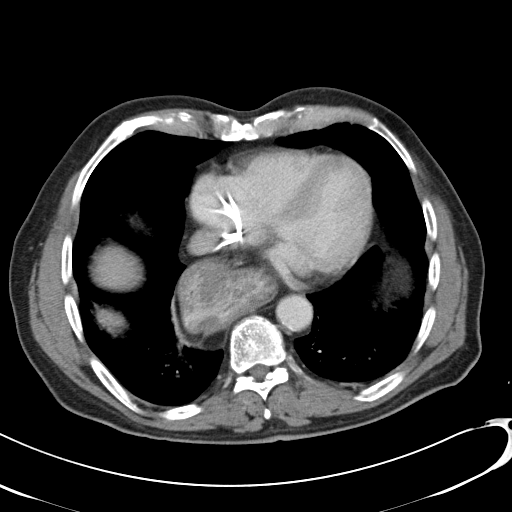

[Series 602: coronals · coronal · 0.82mm/px · 3 of 74 slices shown]
[im 25/74  soft-tissue]
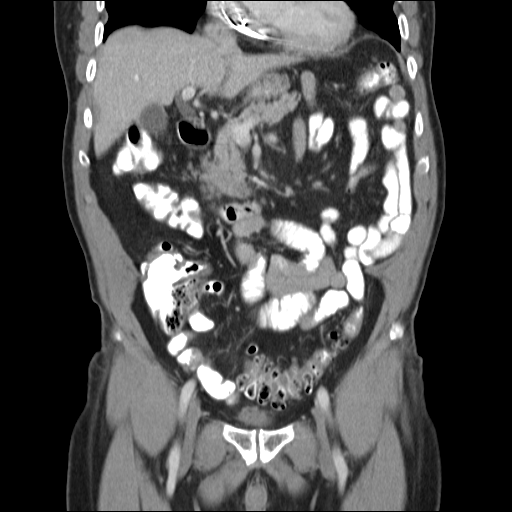
[im 33/74  soft-tissue]
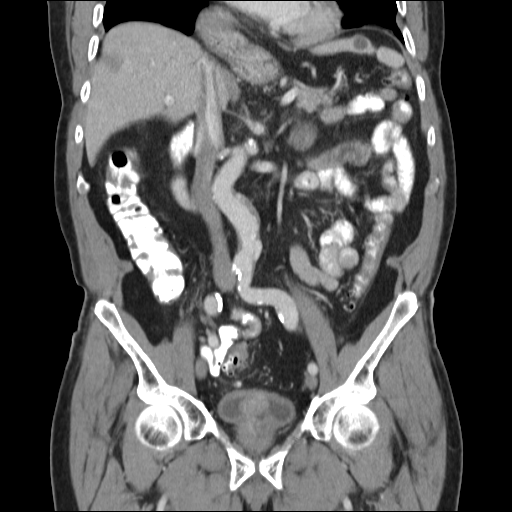
[im 41/74  soft-tissue]
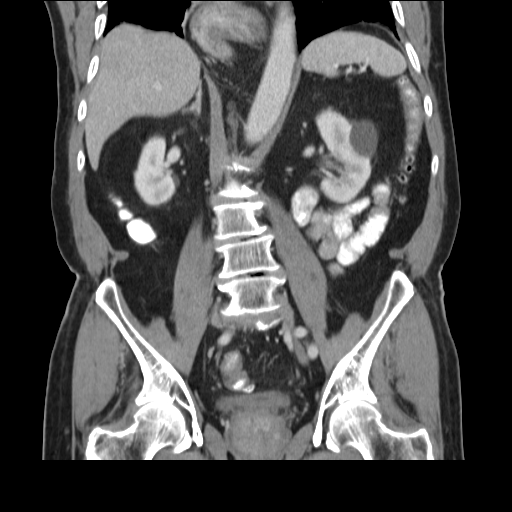

[16 of 46 positions shown; findings below may reference images not displayed]

FINDINGS: Images through the lung bases demonstrate stable
pacemaker leads in the right atrium and right ventricle and a
moderate sized hiatal hernia. Atelectasis or scarring in both lung
bases is similar to prior radiographs.  There is a 7 mm right lower
lobe nodule on image 4 which is not clearly seen on prior
radiographs.

The liver, gallbladder, biliary system and pancreas appear
unremarkable.  An 11 mm low density splenic lesion on image number
eight is likely incidental.  The spleen is normal in size.  There
is no adrenal mass.  Left renal cysts measure up to 2.3 cm in
diameter.  There is no hydronephrosis or enhancing renal mass.  The
right kidney appears normal.

The stomach is incompletely distended and suboptimally evaluated.
No small bowel abnormalities are identified.  There are
diverticular changes of the distal descending and sigmoid colon
without surrounding inflammation.

The prostate gland is moderately enlarged with heterogeneity
asymmetric to the right and protuberance into the lumen of the
bladder.  There is mild bladder wall thickening.

There is diffuse tortuosity and irregular ectasia of the infrarenal
abdominal aorta. There is an approximately 1 cm saccular aneurysm
arising from the left aspect of the aorta on image 42.  No enlarged
abdominal pelvic lymph nodes are seen.  There is a convex right
thoracolumbar scoliosis with associated spondylosis.
IMPRESSION: 1.  Hiatal hernia with incomplete gastric distension.
2.  Descending and sigmoid colon diverticulosis without surrounding
inflammatory change or fluid collection.
3.  Probable incidental splenic and left renal cysts.
4.  Moderate enlargement of the prostate gland with protuberance
into the bladder lumen.
5.  Diffuse atherosclerosis with small saccular aneurysm of the
infrarenal aorta.

6.  Nonspecific right lower lobe pulmonary nodule, not clearly seen
on prior radiographs. If the patient is at high risk for
bronchogenic carcinoma, follow-up chest CT at 3-6 months is
recommended.  If the patient is at low risk for bronchogenic
carcinoma, follow-up chest CT at 6-12 months is recommended.  This
recommendation follows the consensus statement: Guidelines for
Management of Small Pulmonary Nodules Detected on CT Scans: A
Statement from the [HOSPITAL] as published in Radiology

## 2013-07-17 NOTE — Telephone Encounter (Signed)
Pt never called back. .Allen [name]  

## 2013-09-19 DIAGNOSIS — N138 Other obstructive and reflux uropathy: Secondary | ICD-10-CM | POA: Diagnosis not present

## 2013-09-19 DIAGNOSIS — N139 Obstructive and reflux uropathy, unspecified: Secondary | ICD-10-CM | POA: Diagnosis not present

## 2013-09-19 DIAGNOSIS — N281 Cyst of kidney, acquired: Secondary | ICD-10-CM | POA: Diagnosis not present

## 2013-09-19 DIAGNOSIS — N400 Enlarged prostate without lower urinary tract symptoms: Secondary | ICD-10-CM | POA: Diagnosis not present

## 2013-09-19 DIAGNOSIS — N401 Enlarged prostate with lower urinary tract symptoms: Secondary | ICD-10-CM | POA: Diagnosis not present

## 2013-09-25 DIAGNOSIS — N500 Atrophy of testis: Secondary | ICD-10-CM | POA: Diagnosis not present

## 2013-09-25 DIAGNOSIS — N400 Enlarged prostate without lower urinary tract symptoms: Secondary | ICD-10-CM | POA: Diagnosis not present

## 2013-09-25 DIAGNOSIS — N281 Cyst of kidney, acquired: Secondary | ICD-10-CM | POA: Diagnosis not present

## 2013-09-25 DIAGNOSIS — R972 Elevated prostate specific antigen [PSA]: Secondary | ICD-10-CM | POA: Diagnosis not present

## 2013-09-26 ENCOUNTER — Ambulatory Visit (INDEPENDENT_AMBULATORY_CARE_PROVIDER_SITE_OTHER): Payer: Medicaid Other | Admitting: Family Medicine

## 2013-09-26 ENCOUNTER — Encounter: Payer: Self-pay | Admitting: Family Medicine

## 2013-09-26 VITALS — BP 154/80 | HR 94 | Ht 63.0 in | Wt 123.9 lb

## 2013-09-26 DIAGNOSIS — R634 Abnormal weight loss: Secondary | ICD-10-CM

## 2013-09-26 DIAGNOSIS — I10 Essential (primary) hypertension: Secondary | ICD-10-CM

## 2013-09-26 MED ORDER — MIRTAZAPINE 7.5 MG PO TABS: 15.0000 mg | ORAL_TABLET | Freq: Every day | ORAL | Status: DC

## 2013-09-26 NOTE — Patient Instructions (Signed)
Fue un Research officer, trade unionplacer verle hoy.    Si el animo y el apetito siguen bajos, por favor doblele la dosis de la mirtazepine a 15mg  por dia y llameme.   Quiero volver a chequearlo y verle la presion en 3 meses, o antes si hay necesidad.  FOLLOW UP 3 MONTHS WITH DR Mauricio PoBREEN

## 2013-09-28 NOTE — Progress Notes (Signed)
   Subjective:    Patient ID: Allen [name], male    DOB: [birth year], 80 y.o.   MRN: [id number]  HPI  Visit in Spanish.  Patient's wife and daughter in room.  Patient and wife recently returned (13 April) from extended visit to son in [place].  Patient was active there, spending most time outside walking and socializing. Wife and daughter note that he is more withdrawn here.  Hasnt gone for audiology eval yet, wasn't able to make the appt before he left for [place].  Offers no other complaints. Daughter notes he cries sometimes, doesn't eat as much as before. Continues on the Mirtazepine as he had been doing.  Review of Systems He denies chest pain or dyspnea, denies cough, no fevers or chills. Denies depression when asked directly.      Objective:   Physical Exam Well appearing, no acute distress. Flat affect.  HEENT Neck supple, no cervical adenopathy. Moist mucus membranes.  COR regular S1S2, no extra sounds PULM Clear bilaterally. No rales or wheezes.  EXTS No edema.     Assessment & Plan:

## 2013-09-28 NOTE — Assessment & Plan Note (Signed)
Recent loss of appetite, thought to be related to mood disorder/depression.  Patient arrived back from extended visit to his native [place], suspect an element of situational depression /adjustment disorder.  Plan to re-evaluate before increasing mirtazepine dose. Discussed with daughter/caregiver, authorized her to increase dose and call me to report.

## 2013-09-28 NOTE — Assessment & Plan Note (Signed)
Systolic BP today slightly above target of 150; plan to recheck in short interval before making medication adjustments

## 2013-09-29 ENCOUNTER — Telehealth: Payer: Self-pay | Admitting: Family Medicine

## 2013-09-29 NOTE — Telephone Encounter (Signed)
pts hearing aid is broken Please advise

## 2013-10-05 ENCOUNTER — Encounter: Payer: Self-pay | Admitting: Family Medicine

## 2013-10-05 ENCOUNTER — Ambulatory Visit (INDEPENDENT_AMBULATORY_CARE_PROVIDER_SITE_OTHER): Payer: Medicaid Other | Admitting: *Deleted

## 2013-10-05 DIAGNOSIS — I441 Atrioventricular block, second degree: Secondary | ICD-10-CM | POA: Diagnosis not present

## 2013-10-05 DIAGNOSIS — Z950 Presence of cardiac pacemaker: Secondary | ICD-10-CM

## 2013-10-05 LAB — MDC_IDC_ENUM_SESS_TYPE_INCLINIC
Battery Voltage: 2.73 V
Implantable Pulse Generator Model: 349799
Implantable Pulse Generator Serial Number: 76135480
Lead Channel Impedance Value: 549 Ohm
Lead Channel Impedance Value: 869 Ohm
Lead Channel Pacing Threshold Amplitude: 0.6 V
Lead Channel Pacing Threshold Amplitude: 0.7 V
Lead Channel Pacing Threshold Pulse Width: 0.4 ms
Lead Channel Sensing Intrinsic Amplitude: 15.6 mV
Lead Channel Setting Pacing Amplitude: 2 V
Lead Channel Setting Pacing Pulse Width: 0.4 ms
MDC IDC MSMT BATTERY IMPEDANCE: 1700 Ohm
MDC IDC MSMT LEADCHNL RA SENSING INTR AMPL: 4.3 mV
MDC IDC MSMT LEADCHNL RV PACING THRESHOLD PULSEWIDTH: 0.4 ms
MDC IDC SESS DTM: 20150430104549
MDC IDC SET LEADCHNL RV PACING AMPLITUDE: 2 V
MDC IDC SET LEADCHNL RV SENSING SENSITIVITY: 2.5 mV
MDC IDC STAT BRADY RA PERCENT PACED: 72 %
MDC IDC STAT BRADY RV PERCENT PACED: 97 %

## 2013-10-05 NOTE — Progress Notes (Signed)
Pacemaker check in clinic by Industry. Normal device function. Thresholds, sensing, impedances consistent with previous measurements. Device programmed to maximize longevity. 804 mode switches---fastest 174bpm, longest w/EGM 28min. 1 true NSVT---8 beats--max 174bpm. 5 other HVR---all SVTs. Device programmed at appropriate safety margins. Histogram distribution appropriate for patient activity level. Device programmed to optimize intrinsic conduction. Estimated longevity 492yr7mo. ROV w/ JA in 54mo.

## 2013-10-05 NOTE — Progress Notes (Signed)
Patient ID: Allen [name], male   DOB: 1918/06/10, 80 y.o.   MRN: [id number] I attempted to call back at number given, got voice message that "voice mailbox has not been set up yet".   If she calls back, I would like to know what service/referral she needs from [facility], as this is not clear in the initial note. JB

## 2013-10-05 NOTE — Progress Notes (Signed)
Pt's daughter-in-law came in stating that pt needs an referral to [facility] and she can be reached at [phone].

## 2013-10-06 ENCOUNTER — Telehealth: Payer: Self-pay | Admitting: Family Medicine

## 2013-10-06 NOTE — Telephone Encounter (Signed)
Phone call from patient's daughter-in-law (cell [phone], home [phone]), call completed in Spanish. She says that the location where he was referred for audiology ([address]) only does the audiology exam, not the exam and hearing aid.  Wants referral to [address], [address], [place], [phone].  I told her I would forward to our referral coordinator. JB

## 2013-10-10 ENCOUNTER — Other Ambulatory Visit: Payer: Self-pay | Admitting: Family Medicine

## 2013-10-10 DIAGNOSIS — H9190 Unspecified hearing loss, unspecified ear: Secondary | ICD-10-CM

## 2013-10-10 NOTE — Telephone Encounter (Signed)
Marines, Please call patient's daughter in law, Rachel BoBeatriz Gutierrez back regarding referral for an audiologist

## 2013-10-17 ENCOUNTER — Encounter: Payer: Self-pay | Admitting: Family Medicine

## 2013-10-17 ENCOUNTER — Other Ambulatory Visit: Payer: Self-pay | Admitting: Family Medicine

## 2013-10-17 ENCOUNTER — Ambulatory Visit (INDEPENDENT_AMBULATORY_CARE_PROVIDER_SITE_OTHER): Payer: Medicare Other | Admitting: Family Medicine

## 2013-10-17 VITALS — BP 139/84 | HR 69 | Ht 63.0 in | Wt 123.0 lb

## 2013-10-17 DIAGNOSIS — H905 Unspecified sensorineural hearing loss: Secondary | ICD-10-CM | POA: Diagnosis not present

## 2013-10-17 DIAGNOSIS — R634 Abnormal weight loss: Secondary | ICD-10-CM

## 2013-10-17 DIAGNOSIS — R413 Other amnesia: Secondary | ICD-10-CM | POA: Diagnosis not present

## 2013-10-17 LAB — FOLATE: Folate: 11.6 ng/mL

## 2013-10-17 LAB — COMPREHENSIVE METABOLIC PANEL
ALK PHOS: 86 U/L (ref 39–117)
ALT: 15 U/L (ref 0–53)
AST: 18 U/L (ref 0–37)
Albumin: 4 g/dL (ref 3.5–5.2)
BUN: 10 mg/dL (ref 6–23)
CO2: 27 mEq/L (ref 19–32)
CREATININE: 1.09 mg/dL (ref 0.50–1.35)
Calcium: 9.5 mg/dL (ref 8.4–10.5)
Chloride: 94 mEq/L — ABNORMAL LOW (ref 96–112)
Glucose, Bld: 92 mg/dL (ref 70–99)
Potassium: 5 mEq/L (ref 3.5–5.3)
Sodium: 128 mEq/L — ABNORMAL LOW (ref 135–145)
Total Bilirubin: 0.6 mg/dL (ref 0.2–1.2)
Total Protein: 7.2 g/dL (ref 6.0–8.3)

## 2013-10-17 LAB — CBC
HEMATOCRIT: 43.5 % (ref 39.0–52.0)
Hemoglobin: 15.5 g/dL (ref 13.0–17.0)
MCH: 32.7 pg (ref 26.0–34.0)
MCHC: 35.6 g/dL (ref 30.0–36.0)
MCV: 91.8 fL (ref 78.0–100.0)
PLATELETS: 325 10*3/uL (ref 150–400)
RBC: 4.74 MIL/uL (ref 4.22–5.81)
RDW: 13.8 % (ref 11.5–15.5)
WBC: 5.1 10*3/uL (ref 4.0–10.5)

## 2013-10-17 LAB — VITAMIN B12: Vitamin B-12: 938 pg/mL — ABNORMAL HIGH (ref 211–911)

## 2013-10-17 LAB — TSH: TSH: 4.472 u[IU]/mL (ref 0.350–4.500)

## 2013-10-17 NOTE — Patient Instructions (Signed)
Fue un Research officer, trade unionplacer verle hoy.   Estamos haciendo laboratorios hoy; les contacto al [phone] y de ahi hablamos de hacerle algun estudio del cerebro.   FOLLOW UP WITH DR Mauricio PoBREEN IN 1 MONTH.

## 2013-10-18 ENCOUNTER — Telehealth: Payer: Self-pay | Admitting: Family Medicine

## 2013-10-18 DIAGNOSIS — R413 Other amnesia: Secondary | ICD-10-CM

## 2013-10-18 DIAGNOSIS — E871 Hypo-osmolality and hyponatremia: Secondary | ICD-10-CM | POA: Insufficient documentation

## 2013-10-18 HISTORY — DX: Hypo-osmolality and hyponatremia: E87.1

## 2013-10-18 LAB — RPR

## 2013-10-18 NOTE — Telephone Encounter (Signed)
Called son Stevie KernVictorino Gutierrez by cell phone, discussed results of father's labs.  Father had previously been on LT4 for hypothyroidism but has not been on for quite awhile.  His TSH on this check is within normal limits. Also noted is mild hyponatremia.  Clyde LundborgVictorino says father does not drink water at all, family has to prod him to drink at all.  Plan to recheck BMet and Serum/Urine Osm, urine Na.  He is not on a thiazide diuretic. Plan to recheck TSH and free T4 as well. Noncontrast head CT to look for structural causes of dementia, in setting of accelerated deterioration noted by family.  JB

## 2013-10-18 NOTE — Progress Notes (Signed)
   Subjective:    Patient ID: Allen [name], male    DOB: [birth year], 80 y.o.   MRN: [id number]  HPI Visit conducted in Spanish. Patient accompanied by his son, Allen [name] [name], who voices concerns about patient's memory.  Patient and wife returned from extended visit to [place] on April 14th, family has noticed worsening of pre-existing memory deficits, particularly with short-term memory.  As an example, son will put out medicine for father to take and will find that his father doesn't take, says he didn't remember being instructed to do so.  Has seemed more distant and less talkative since returning from [place]. Unclear whether he had these problems while in [place] as well.  Patient also with known hearing deficits, seeing [facility] today and plans for new hearing aid from them.   Also concern about unsteady wide-based gait when walking in dark house; not a problem when surroundings are well lit. No falls, no trauma.   At last visit it was decided to consider increased dose of mirtazepine, Allen says that they have not increased dose (still on 7.5mg  daily).  He does not share his sister's concern for father's appetite, stating that this patient eats an adequate amount of food.   PMHx; No history of stroke or cardiovascular event.  Review of Systems Denies depression or anhedonia; denies chest pain or shortness of breath. Denies changes in his vision.  Denies falls, denies fevers or chills. Denies cough. Primary school education, enjoys reading and is literate in [place].    Objective:   Physical Exam Alert, well appearing; able to answer my questions, but somewhat hard of hearing.  HEENT NEck supple, no cervical adenopathy.  COR Regular S1S2, no extra sounds PULM Clear bilaterally, no rales or wheezes NEURO Able to walk freely about the office without assistance; no wide-based stance with walking or standing.  Does not appear unsteady.  No cogwheeling or rigidity, no  pill-rolling or rocking while seated. Reflexes (biceps, brachioradialis and patellar bilaterally) all 1-2+. Handgrip, upper ext and lower ext strength all full and symmetric.  Modified Mini-Mental (administered in Spanish): Not oriented to date, day of week, month, year, or season (does not appear interested in attempting these questions).  Knows he's in the "doctor's office", but unable to give more precise location.  Three-word recall (immediate) 3/3; delayed (5-min) recall 3/3. Misses one letter in spelling "MUNDO" (spells, "MUNO"); unable to spell backwards.  Can follow 2 of 3-step command given once.  Does not follow written instructions (written, "close your eyes"). Cannot place numbers on clock face (places 7 numbers very closely around center of clock).  His written sentence is partially intelligible.  (score @11 /25).        Assessment & Plan:

## 2013-10-18 NOTE — Assessment & Plan Note (Signed)
Significant memory loss on mini-mental exam today.  Lab evaluation to look for underlying contributing conditions, as well as plan for neuroimaging to consider multi-infarct dementia or other CNS process. Consider contribution of sensory deficits (hearing, vision), as well as depression which I believe is likely underlying.  Patient is at high risk of associated adjustment d/o associated with transitions from recent extended stay (3 months) in his native [place] and now returning [place], where his memory deficits are likely more pronounced and noticeable. To follow up in the coming 3-4 weeks to discuss whether there is a role for cholinesterase inhibitor medication.

## 2013-10-18 NOTE — Assessment & Plan Note (Signed)
Stabilized. Has not had increase in dose of Mirtazapine.  Plan to continue at current dose of medication; continue to follow weights.

## 2013-10-23 ENCOUNTER — Other Ambulatory Visit: Payer: Medicare Other

## 2013-10-23 DIAGNOSIS — E871 Hypo-osmolality and hyponatremia: Secondary | ICD-10-CM | POA: Diagnosis not present

## 2013-10-23 LAB — BASIC METABOLIC PANEL
BUN: 11 mg/dL (ref 6–23)
CHLORIDE: 95 meq/L — AB (ref 96–112)
CO2: 28 mEq/L (ref 19–32)
Calcium: 9.5 mg/dL (ref 8.4–10.5)
Creat: 1.17 mg/dL (ref 0.50–1.35)
Glucose, Bld: 102 mg/dL — ABNORMAL HIGH (ref 70–99)
POTASSIUM: 4.9 meq/L (ref 3.5–5.3)
Sodium: 129 mEq/L — ABNORMAL LOW (ref 135–145)

## 2013-10-23 LAB — OSMOLALITY: OSMOLALITY: 273 mosm/kg — AB (ref 275–300)

## 2013-10-23 NOTE — Progress Notes (Signed)
BMP,TSH,F-T3,F-T4,OSMO-SERUM, OSMO URINE AND SODIUM URINE DONE TODAY Allen [name]

## 2013-10-24 LAB — T4, FREE: FREE T4: 0.9 ng/dL (ref 0.80–1.80)

## 2013-10-24 LAB — SODIUM, URINE, RANDOM: Sodium, Ur: 24 mEq/L

## 2013-10-24 LAB — T3, FREE: T3 FREE: 2.4 pg/mL (ref 2.3–4.2)

## 2013-10-24 LAB — TSH: TSH: 3.919 u[IU]/mL (ref 0.350–4.500)

## 2013-10-24 LAB — OSMOLALITY, URINE: OSMOLALITY UR: 226 mosm/kg — AB (ref 390–1090)

## 2013-10-27 ENCOUNTER — Encounter: Payer: Self-pay | Admitting: Internal Medicine

## 2013-11-02 ENCOUNTER — Telehealth: Payer: Self-pay | Admitting: Family Medicine

## 2013-11-02 DIAGNOSIS — E871 Hypo-osmolality and hyponatremia: Secondary | ICD-10-CM

## 2013-11-02 NOTE — Telephone Encounter (Signed)
Called and spoke with patient's daughter-in-law Rayburn Ma about the results of the urine and serum testing for hyponatremia.  Low serum and urine Osm, low urine Na+.  Taking Remeron 7.5mg  daily, which is implicated in SIADH.  Rayburn Ma says he's eating and drinking normally.  Plan to hold Remeron and recheck BMet in 2-3 weeks.  Paula Compton, MD

## 2013-11-14 ENCOUNTER — Ambulatory Visit (INDEPENDENT_AMBULATORY_CARE_PROVIDER_SITE_OTHER): Payer: Medicare Other | Admitting: Family Medicine

## 2013-11-14 ENCOUNTER — Encounter: Payer: Self-pay | Admitting: Family Medicine

## 2013-11-14 VITALS — BP 144/80 | HR 82 | Ht 63.0 in | Wt 128.0 lb

## 2013-11-14 DIAGNOSIS — E871 Hypo-osmolality and hyponatremia: Secondary | ICD-10-CM

## 2013-11-14 LAB — BASIC METABOLIC PANEL
BUN: 9 mg/dL (ref 6–23)
CALCIUM: 9 mg/dL (ref 8.4–10.5)
CO2: 26 mEq/L (ref 19–32)
Chloride: 96 mEq/L (ref 96–112)
Creat: 0.97 mg/dL (ref 0.50–1.35)
Glucose, Bld: 101 mg/dL — ABNORMAL HIGH (ref 70–99)
Potassium: 4.2 mEq/L (ref 3.5–5.3)
SODIUM: 128 meq/L — AB (ref 135–145)

## 2013-11-14 NOTE — Patient Instructions (Signed)
Fue un Research officer, trade union.  Estamos chequeandole el sodio en la sangre de [place], ahora que ha suspendido la remeron (medicina para el apetito y la depresion).  Estamos marcandole la cita para la tomografia de la cabeza, para la perdida de Scientist, clinical (histocompatibility and immunogenetics).   Quiero volver a verle dentro de 2 semanas (despues de la tomografia).   FOLLOW UP WITH DR Mauricio Po IN 2 TO 3 WEEKS (AFTER CT SCAN HEAD).

## 2013-11-15 ENCOUNTER — Telehealth: Payer: Self-pay | Admitting: Family Medicine

## 2013-11-15 DIAGNOSIS — E871 Hypo-osmolality and hyponatremia: Secondary | ICD-10-CM

## 2013-11-15 NOTE — Assessment & Plan Note (Signed)
Based on prior studies of urine/serum Osm, and appearance of euvolemia, and normal TSH, had suspected possible role of Remeron in hyponatremia. Also to consider cortisol deficiency, or SIADH. To check BMet today; if sodium not improved, then to consider morning cortisol level and fluid restriction. Will call family with results.

## 2013-11-15 NOTE — Progress Notes (Signed)
   Subjective:    Patient ID: Allen [name], male    DOB: [birth year], 80 y.o.   MRN: [id number]  HPI Visit in Spanish. Daughter in law Rayburn Ma is historian.  He has stopped the Remeron and done well, has increased his oral intake. Feels well, reports increased energy.  No N/V, no fevers or chills.   Review of Systems     Objective:   Physical Exam  Well appearing, NAD HEENT: neck supple MMM      Assessment & Plan:

## 2013-11-15 NOTE — Telephone Encounter (Signed)
Called to give results, spoke with Rayburn Ma (daugher in Social worker).  Patient is eating and drinking well, not taking Remeron.  Suspect SIADH or cortisol deficiency. Instructed to remain off Remeron, limit oral liquid intake to <1L/day, recheck serum Na+ and serum cortisol first thing on Friday morning (June 12). She is in agreement. I will send note to Red Team to put Mr. [name] on schedule for labs.  JB

## 2013-11-17 ENCOUNTER — Other Ambulatory Visit: Payer: Medicare Other

## 2013-11-17 DIAGNOSIS — E871 Hypo-osmolality and hyponatremia: Secondary | ICD-10-CM

## 2013-11-17 LAB — BASIC METABOLIC PANEL
BUN: 9 mg/dL (ref 6–23)
CHLORIDE: 95 meq/L — AB (ref 96–112)
CO2: 25 meq/L (ref 19–32)
Calcium: 9.1 mg/dL (ref 8.4–10.5)
Creat: 1.08 mg/dL (ref 0.50–1.35)
Glucose, Bld: 90 mg/dL (ref 70–99)
POTASSIUM: 4.5 meq/L (ref 3.5–5.3)
SODIUM: 129 meq/L — AB (ref 135–145)

## 2013-11-17 NOTE — Progress Notes (Signed)
BMP AND CORTISOL AM DONE TODAY Allen [name]

## 2013-11-18 LAB — CORTISOL-AM, BLOOD: Cortisol - AM: 16.7 ug/dL (ref 4.3–22.4)

## 2013-11-20 ENCOUNTER — Encounter (INDEPENDENT_AMBULATORY_CARE_PROVIDER_SITE_OTHER): Payer: Self-pay

## 2013-11-20 ENCOUNTER — Ambulatory Visit
Admission: RE | Admit: 2013-11-20 | Discharge: 2013-11-20 | Disposition: A | Discharge status: Home / Self Care | Payer: Medicare Other | Source: Ambulatory Visit | Attending: Family Medicine | Admitting: Family Medicine

## 2013-11-20 ENCOUNTER — Telehealth: Payer: Self-pay | Admitting: Family Medicine

## 2013-11-20 DIAGNOSIS — F0390 Unspecified dementia without behavioral disturbance: Secondary | ICD-10-CM | POA: Diagnosis not present

## 2013-11-20 DIAGNOSIS — R413 Other amnesia: Secondary | ICD-10-CM

## 2013-11-20 DIAGNOSIS — E871 Hypo-osmolality and hyponatremia: Secondary | ICD-10-CM

## 2013-11-20 NOTE — Telephone Encounter (Signed)
Called and spoke with daughter-in-law Allen [name], regarding CT and lab results.  Plan to continue fluid restriction of 1L/day, recheck in the coming 2 weeks.  Also to consider Aricept for patient with memory loss, progressive.  To make appt in the coming 2 weeks.  JB

## 2013-11-21 ENCOUNTER — Ambulatory Visit: Payer: Medicare Other | Admitting: Family Medicine

## 2013-12-01 ENCOUNTER — Ambulatory Visit (INDEPENDENT_AMBULATORY_CARE_PROVIDER_SITE_OTHER): Payer: Medicare Other | Admitting: Family Medicine

## 2013-12-01 ENCOUNTER — Encounter: Payer: Self-pay | Admitting: Family Medicine

## 2013-12-01 VITALS — BP 147/87 | HR 83 | Ht 63.0 in | Wt 124.0 lb

## 2013-12-01 DIAGNOSIS — E871 Hypo-osmolality and hyponatremia: Secondary | ICD-10-CM | POA: Diagnosis not present

## 2013-12-01 DIAGNOSIS — R413 Other amnesia: Secondary | ICD-10-CM

## 2013-12-01 MED ORDER — DONEPEZIL HCL 5 MG PO TABS: 5.0000 mg | ORAL_TABLET | Freq: Every day | ORAL | Status: DC

## 2013-12-01 NOTE — Progress Notes (Signed)
   Subjective:    Patient ID: Allen [name], male    DOB: [birth year], 80 y.o.   MRN: [id number]  HPI Visit in Spanish.  Daughter-in-law Rayburn MaBeatriz is historian.   Here for follow up of memory loss, and hyponatremia.  Normal serum morning cortisol level since last visit.  Thought SIADH, has been on volume restriction since mid-June. This does not seem to be bothering him.  Appetite is fine. No fevers or chills, no new complaints.   Memory loss (short-term, recent) has been relatively unchanged in the past 1 month. CT imaging and lab workup done, not revealing of other etiologies.    Review of Systems Denies cough, chest pain or shortness of breath. No fevers or chills. Mood somewhat improved from previous. No presyncope or syncope.       Objective:   Physical Exam Well appearing, no apparent distress.  HEENT Neck supple, no cervical adenopathy. Moist mucus membranes.  COR Regular S1S2, no extra sounds PULM Clear bilaterally, no rales or wheezes EXTS: no edema noted in ankles.       Assessment & Plan:  +

## 2013-12-01 NOTE — Patient Instructions (Addendum)
Fue un Research officer, trade unionplacer verle hoy.   Para la memoria, estoy recetandole una nueva medicina que se llama de "ARICEPT" (donezepil) 5mg , una vez diario antes de acostarse.  Como hablamos, algunos efectos secundarios comunes de esta medicina tienen que ver con el sistema digestivo, como la nausea, diarrhea o vomito. Si no le cae bien la medicina, por favor suspendala y llame al consultorio.   FOLLOW UP WITH DR Mauricio PoBREEN IN 6 TO 8 WEEKS.   Le llamo con los resultados del sodio, que estamos chequeando hoy.

## 2013-12-01 NOTE — Assessment & Plan Note (Signed)
Patient with memory loss and no organic underlying cause identified on neuroimaging or lab workup.  To start Aricept 5mg  nightly today, with follow up in about 6-8 weeks to discuss possibility of increase in dose. Discussed risks/benefits and what to do if he experiences adverse effects.

## 2013-12-01 NOTE — Assessment & Plan Note (Signed)
Thought to be SIADH.  Had been taken off mirtazapine without resolution.  Plan to recheck BMet today, to call [name] with results [phone].

## 2013-12-02 LAB — BASIC METABOLIC PANEL
BUN: 8 mg/dL (ref 6–23)
CO2: 25 meq/L (ref 19–32)
Calcium: 8.9 mg/dL (ref 8.4–10.5)
Chloride: 95 mEq/L — ABNORMAL LOW (ref 96–112)
Creat: 0.9 mg/dL (ref 0.50–1.35)
GLUCOSE: 113 mg/dL — AB (ref 70–99)
POTASSIUM: 4.4 meq/L (ref 3.5–5.3)
Sodium: 128 mEq/L — ABNORMAL LOW (ref 135–145)

## 2013-12-04 ENCOUNTER — Telehealth: Payer: Self-pay | Admitting: Family Medicine

## 2013-12-04 NOTE — Telephone Encounter (Signed)
Called and spoke with daughter-in-law Allen [name] regarding most recent labs, and toleration of Aricept. He is tolerating the Aricept well, no problems. No GI upset.  Discussed the serum Na+ which has not changed appreciably, despite the fluid restriction.  No identified thyroid dysfunction, normal morning serum cortisol.  Plan to remove fluid restriction and recheck in about a month.  No symptoms identified; long-standing hyponatremia.  Allen [name] voices understanding of plan, will call if needed. JB

## 2014-01-09 ENCOUNTER — Encounter: Payer: Self-pay | Admitting: Internal Medicine

## 2014-01-26 ENCOUNTER — Encounter: Payer: Self-pay | Admitting: Family Medicine

## 2014-01-26 ENCOUNTER — Ambulatory Visit (INDEPENDENT_AMBULATORY_CARE_PROVIDER_SITE_OTHER): Payer: Medicare Other | Admitting: Family Medicine

## 2014-01-26 VITALS — BP 151/76 | HR 65 | Temp 97.7°F | Ht 63.0 in | Wt 127.4 lb

## 2014-01-26 DIAGNOSIS — N138 Other obstructive and reflux uropathy: Secondary | ICD-10-CM | POA: Diagnosis not present

## 2014-01-26 DIAGNOSIS — N401 Enlarged prostate with lower urinary tract symptoms: Secondary | ICD-10-CM | POA: Diagnosis not present

## 2014-01-26 DIAGNOSIS — R413 Other amnesia: Secondary | ICD-10-CM | POA: Diagnosis not present

## 2014-01-26 DIAGNOSIS — H11009 Unspecified pterygium of unspecified eye: Secondary | ICD-10-CM

## 2014-01-26 DIAGNOSIS — H11003 Unspecified pterygium of eye, bilateral: Secondary | ICD-10-CM

## 2014-01-26 HISTORY — DX: Unspecified pterygium of unspecified eye: H11.009

## 2014-01-26 MED ORDER — DONEPEZIL HCL 5 MG PO TABS: 5.0000 mg | ORAL_TABLET | Freq: Every day | ORAL | Status: DC

## 2014-01-26 MED ORDER — FINASTERIDE 5 MG PO TABS: 5.0000 mg | ORAL_TABLET | Freq: Every day | ORAL | Status: DC

## 2014-01-26 MED ORDER — SILODOSIN 8 MG PO CAPS: 8.0000 mg | ORAL_CAPSULE | Freq: Every day | ORAL | Status: DC

## 2014-01-26 MED ORDER — PRAVASTATIN SODIUM 10 MG PO TABS: 10.0000 mg | ORAL_TABLET | Freq: Every day | ORAL | Status: DC

## 2014-01-26 NOTE — Assessment & Plan Note (Signed)
Patient tolerating Aricept without GI upset; daughter-in-law says he is more interactive and social at home, does not appear depressed and withdrawn as he was previously.  No further worsening of memory observed.  Is scheduled to get his new bilateral hearing aides on Aug 24, which is expected to help him further. Will continue with current dose and review again in 3 more months.

## 2014-01-26 NOTE — Assessment & Plan Note (Signed)
Urinary symptoms have been controlled with current medication regimen.  Refills issued today.

## 2014-01-26 NOTE — Patient Instructions (Signed)
Fue un Research officer, trade unionplacer verle hoy.    Para las carnosidades en los ojos, [place]ued[place] ver al oftalmologo:   Allen KirkGary Rankin, MD Ophthalmologist Address: [address], [place], [place] [address] [phone]  Mande' las recetas a la farmacia por correo ([facility]).  Volver al consultorio en 3 meses.   FOLLOW UP IN 3 MONTHS.

## 2014-01-26 NOTE — Progress Notes (Signed)
   Subjective:    Patient ID: Allen [name], male    DOB: [birth year], 80 y.o.   MRN: [id number]  HPI Visit in Spanish. Daughter-in-law Rayburn Ma([name]) is primary historian; wife Raquel is also present for this visit.   Family states that Mr. Sharen HonesGutierrez has been more alert and interactive, social since starting the Aricept.  No noticeable worsening of memory.  Appears less withdrawn than previously.  Eating well, no complaints of poor appetite or nausea/vomiting.  Is not having diarrhea.   Is scheduled to get his new hearing aides (both ears) on August 24.  This summer two of his daughters have come for extended visits (one from [place], another from [place]    He has noticed redness and irritation along the nasal aspect of both eyes; no visual disturbances. No rhinorrhea or coryza noted.   No fevers/chills, no falls. Is passing urine well, no retention or dysuria. Occasional low back pain that resolves with stretching.    Review of Systems     Objective:   Physical Exam Well appearing, no apparent distress HEENT Neck supple, no cervical adenopathy.  Bilateral pterygia noted along nasal aspects of both eyes. PERRL. Arcus senilis OU. TMs clear bilaterally without cerumen.  COR regular S1S2, no extra sounds PULM Clear bilaterally, no rales or wheezes ABD Soft, nontender, nondistended.  NEURO: Gait mild shuffling, without assistive devices. Speech fluid and uninterrupted.  MSK: No tenderness to palpate along LS spine or SI joint.        Assessment & Plan:

## 2014-02-05 ENCOUNTER — Other Ambulatory Visit: Payer: Self-pay | Admitting: *Deleted

## 2014-02-05 MED ORDER — OMEPRAZOLE 20 MG PO CPDR: 20.0000 mg | DELAYED_RELEASE_CAPSULE | Freq: Two times a day (BID) | ORAL | Status: DC

## 2014-03-02 DIAGNOSIS — H11009 Unspecified pterygium of unspecified eye: Secondary | ICD-10-CM | POA: Diagnosis not present

## 2014-03-02 DIAGNOSIS — H02109 Unspecified ectropion of unspecified eye, unspecified eyelid: Secondary | ICD-10-CM | POA: Diagnosis not present

## 2014-03-02 DIAGNOSIS — H01009 Unspecified blepharitis unspecified eye, unspecified eyelid: Secondary | ICD-10-CM | POA: Diagnosis not present

## 2014-03-20 ENCOUNTER — Ambulatory Visit: Payer: Medicare Other

## 2014-03-26 ENCOUNTER — Ambulatory Visit (INDEPENDENT_AMBULATORY_CARE_PROVIDER_SITE_OTHER): Payer: Medicare Other | Admitting: *Deleted

## 2014-03-26 DIAGNOSIS — Z23 Encounter for immunization: Secondary | ICD-10-CM | POA: Diagnosis not present

## 2014-04-06 ENCOUNTER — Encounter: Payer: Self-pay | Admitting: Internal Medicine

## 2014-04-06 ENCOUNTER — Ambulatory Visit (INDEPENDENT_AMBULATORY_CARE_PROVIDER_SITE_OTHER): Payer: Medicare Other | Admitting: Internal Medicine

## 2014-04-06 VITALS — BP 161/90 | HR 67 | Ht 63.0 in | Wt 124.0 lb

## 2014-04-06 DIAGNOSIS — I441 Atrioventricular block, second degree: Secondary | ICD-10-CM | POA: Diagnosis not present

## 2014-04-06 DIAGNOSIS — I10 Essential (primary) hypertension: Secondary | ICD-10-CM | POA: Diagnosis not present

## 2014-04-06 DIAGNOSIS — I495 Sick sinus syndrome: Secondary | ICD-10-CM

## 2014-04-06 LAB — MDC_IDC_ENUM_SESS_TYPE_INCLINIC
Battery Impedance: 2200 Ohm
Battery Voltage: 2.71 V
Brady Statistic RA Percent Paced: 86 %
Brady Statistic RV Percent Paced: 99 %
Lead Channel Impedance Value: 549 Ohm
Lead Channel Pacing Threshold Amplitude: 0.6 V
Lead Channel Sensing Intrinsic Amplitude: 5.2 mV
Lead Channel Setting Pacing Amplitude: 2 V
Lead Channel Setting Pacing Pulse Width: 0.4 ms
MDC IDC MSMT LEADCHNL RA PACING THRESHOLD PULSEWIDTH: 0.4 ms
MDC IDC MSMT LEADCHNL RV IMPEDANCE VALUE: 869 Ohm
MDC IDC MSMT LEADCHNL RV PACING THRESHOLD AMPLITUDE: 0.5 V
MDC IDC MSMT LEADCHNL RV PACING THRESHOLD PULSEWIDTH: 0.4 ms
MDC IDC MSMT LEADCHNL RV SENSING INTR AMPL: 16.5 mV
MDC IDC PG SERIAL: 76135480
MDC IDC SESS DTM: 20151030100545
MDC IDC SET LEADCHNL RA PACING AMPLITUDE: 2 V
MDC IDC SET LEADCHNL RV SENSING SENSITIVITY: 2.5 mV

## 2014-04-06 NOTE — Patient Instructions (Signed)
Your physician wants you to follow-up in: 6 months with device clinic and 12 months with Dr Allred You will receive a reminder letter in the mail two months in advance. If you don't receive a letter, please call our office to schedule the follow-up appointment.  

## 2014-04-07 NOTE — Progress Notes (Signed)
ELECTROPHYSIOLOGY OFFICE NOTE  Patient ID: Allen [name] MRN: [id number], DOB/AGE: [birth year]   Date of Visit: 04/07/2014  Primary Physician: Allen ComptonJames Breen, MD Primary Cardiologist: Allen RangeJames Dantonio Justen, MD Reason for Visit: EP/device follow-up  History of Present Illness  Allen [name] is a 80 y.o. male with prior syncope with documented second degree AV block s/p PPM implant 2010 who presents today for routine electrophysiology followup.  He is doing well at this time for his age.  He is not very active but enjoys reading.  He denies chest pain or shortness of breath. He denies palpitations, dizziness, near syncope or syncope. He denies LE swelling, orthopnea, PND or recent weight gain. He is compliant with his medications.  Past Medical History Past Medical History  Diagnosis Date  . Pacemaker   . Hypertension   . GERD (gastroesophageal reflux disease)   . H/O hiatal hernia   . Arthritis     hands and feet  . Anemia   . Memory loss   . TSH elevation   . Risk for falls   . Prostatic hypertrophy, benign, with obstruction   . Hyperlipidemia   . Pacemaker 2010  . Syncope   . Dizziness   . Hearing loss   . Restless legs syndrome (RLS)   . Lumbar radiculopathy, chronic   . Complicated UTI (urinary tract infection)     Past Surgical History Past Surgical History  Procedure Laterality Date  . Testicle surgery  1933  . Inguinal hernia repair  1980  . Pacemaker placement  03/2009    syncope - bradycardia, and 2nd degree block   . Hernia repair    . Esophagogastroduodenoscopy  01/29/2012    Procedure: ESOPHAGOGASTRODUODENOSCOPY (EGD);  Surgeon: Allen FolkJohn C Hayes, MD;  [facility];  Service: Endoscopy;  Laterality: N/A;  Intrepreter scheduled 930-12.    Allergies/Intolerances Allergies  Allergen Reactions  . Flomax [Tamsulosin Hcl] Other (See Comments)    Pt becomes dizzy and passes out.    Current Home Medications Current Outpatient  Prescriptions  Medication Sig Dispense Refill  . aspirin EC 81 MG tablet Take 81 mg by mouth daily.        Marland Kitchen. CALCIUM-VITAMIN D PO Take 1 capsule by mouth daily.      Marland Kitchen. docusate sodium (COLACE) 100 MG capsule Take 1 capsule (100 mg total) by mouth daily as needed for constipation.  30 capsule  0  . donepezil (ARICEPT) 5 MG tablet Take 1 tablet (5 mg total) by mouth at bedtime.  90 tablet  3  . finasteride (PROSCAR) 5 MG tablet Take 1 tablet (5 mg total) by mouth daily.  90 tablet  3  . Fish Oil OIL Take 1 capsule by mouth 3 (three) times daily.       . fluticasone (FLONASE) 50 MCG/ACT nasal spray Place 2 sprays into both nostrils daily.  16 g  6  . mirtazapine (REMERON) 7.5 MG tablet Take 2 tablets (15 mg total) by mouth at bedtime.  180 tablet  3  . omeprazole (PRILOSEC) 20 MG capsule Take 1 capsule (20 mg total) by mouth 2 (two) times daily.  180 capsule  3  . pravastatin (PRAVACHOL) 10 MG tablet Take 1 tablet (10 mg total) by mouth daily.  90 tablet  3  . silodosin (RAPAFLO) 8 MG CAPS capsule Take 1 capsule (8 mg total) by mouth daily with breakfast.  90 capsule  3   No current facility-administered medications for this visit.    Social  History Social History  . Marital Status: Married   Social History Main Topics  . Smoking status: Former Games developermoker  . Smokeless tobacco: Not on file  . Alcohol Use: No  . Drug Use: No   Social History Narrative   Born in [place], lived in [place] until 2005   Retired farmer   Catholic faith   [place] stays with [place] in [place].    3 [place] in [place], lives with wife and son.   daugher, Allen [name], is main caregiver.    Has been in [place] for about 1 year. Wife lives [place] in [place]. Smoked when he was younger for about 20 years (6-7 cigarettes/day).    Review of Systems All systems reviewed and are otherwise negative except as noted above.  Physical Exam Vitals: Blood pressure 161/90, pulse 67, height 5\' 3"  (1.6  m), weight 124 lb (56.246 kg), SpO2 96.00%.  General: Well developed, well appearing 80 y.o. male in no acute distress. He appears younger than his stated age. HEENT: Normocephalic, atraumatic. EOMs intact. Sclera nonicteric. Oropharynx clear.  Neck: Supple. No JVD. Lungs: Respirations regular and unlabored, CTA bilaterally. No wheezes, rales or rhonchi. Heart: RRR. S1, S2 present. No murmurs, rub, S3 or S4. Abdomen: Soft, non-distended. Extremities: No clubbing, cyanosis or edema. PT/Radials 2+ and equal bilaterally. Psych: Normal affect. Neuro: Alert and oriented X 3. Moves all extremities spontaneously.  Device interrogation today - Normal device function. Thresholds, sensing, impedances consistent with previous measurements. Device programmed to maximize longevity.  Available EGMs show PACs and atrial tach. No afib. Device programmed at appropriate safety margins. Histogram distribution appropriate for patient activity level. Device programmed to optimize intrinsic conduction. Estimated longevity 1 years 3 months.   Assessment and Plan 1. Symptomatic bradycardia with second-degree AV block s/p PPM implant - Normal device function - No programming changes made - Return for follow-up in device clinic in 6 months.  I will see again in 1 year  2. Hypertension Though BP is elevated today, prior postural dizziness and presyncope was felt to be exacerbated by antihypertensives.  I will follow for now

## 2014-04-27 ENCOUNTER — Ambulatory Visit (INDEPENDENT_AMBULATORY_CARE_PROVIDER_SITE_OTHER): Payer: Medicare Other | Admitting: Family Medicine

## 2014-04-27 ENCOUNTER — Encounter: Payer: Self-pay | Admitting: Family Medicine

## 2014-04-27 VITALS — BP 158/80 | HR 69 | Temp 98.1°F | Ht 63.0 in | Wt 125.1 lb

## 2014-04-27 DIAGNOSIS — R413 Other amnesia: Secondary | ICD-10-CM | POA: Diagnosis not present

## 2014-04-27 DIAGNOSIS — M5416 Radiculopathy, lumbar region: Secondary | ICD-10-CM | POA: Diagnosis not present

## 2014-04-27 NOTE — Progress Notes (Signed)
   Subjective:    Patient ID: Allen [name], male    DOB: Apr 04, 1919, 80 y.o.   MRN: [id number]  HPI Visit in SPanish. Daughter in law Allen [name] is historian, along with the patient.  Complaint today of low back pain, longstanding complaint that has not changed in recent times.  More prominent along the R lower back, with some complaint of radiation down right leg to feet. No associated falls or weakness. Patient recalls carrying heavy object on his back across a river in [place], having excruciating back pain, years ago.  Allen [name] adds that she believes he complains of this pain more now, since being taken off Gabapentin last year after a fainting spell. No further fainting spells.  Does not take other meds for the pain.   Appetite has picked up. Otherwise feeling well.  Ambulates without a cane.   ROS: NO fevers or chills, no chest pain, no dyspnea, no cough.    Review of Systems     Objective:   Physical Exam Well appearing, no apparent distress.  HEENT Neck supple. No cervical adenopathy.  COR Regular S1S2, no extra sounds PULM Clear bilaterally, no rales or wheezes.  MSK: Tenderness along midline and right paraspinous muscles in lumbar region. When patient flexes forward, appears to have spinal curvature (concavity to the right) in lower thoracic level. No SI joint tenderness. SLR intact. Able to stand independently, able to stand on toes and heels. Sensation in feet grossly normal and intact as determined with monofilament. Palpable dp pulses bilaterally, brisk cap refill in toes bilaterally. No skin breakdown. DTRs 1+ symmetrically.  Walks without assistance.        Assessment & Plan:

## 2014-04-27 NOTE — Assessment & Plan Note (Signed)
Low back pain, with radiculopathy symptoms but no evidence on exam. Neurologically intact. Xrays today; consider restarting low-dose gabapentin for symptom relief.  Tylenol as needed, heat for now.

## 2014-04-27 NOTE — Assessment & Plan Note (Signed)
Doing well since starting Aricept. No changes in meds.

## 2014-04-27 NOTE — Patient Instructions (Signed)
Fue un Research officer, trade unionplacer verle hoy.    Para la espalda, vamos a chequear una placa de la espalda para ver si explica el dolor de espalda.   Le llamo con el resultado de la placa.   Tylenol 650mg , una tableta cada 4-6 horas segun necesite.

## 2014-05-18 ENCOUNTER — Other Ambulatory Visit: Payer: Self-pay | Admitting: Family Medicine

## 2014-05-18 ENCOUNTER — Ambulatory Visit
Admission: RE | Admit: 2014-05-18 | Discharge: 2014-05-18 | Disposition: A | Discharge status: Home / Self Care | Payer: Medicare Other | Source: Ambulatory Visit | Attending: Family Medicine | Admitting: Family Medicine

## 2014-05-18 DIAGNOSIS — M4186 Other forms of scoliosis, lumbar region: Secondary | ICD-10-CM | POA: Diagnosis not present

## 2014-05-18 DIAGNOSIS — M5416 Radiculopathy, lumbar region: Secondary | ICD-10-CM

## 2014-05-18 DIAGNOSIS — M5137 Other intervertebral disc degeneration, lumbosacral region: Secondary | ICD-10-CM | POA: Diagnosis not present

## 2014-05-18 DIAGNOSIS — S33130A Subluxation of L3/L4 lumbar vertebra, initial encounter: Secondary | ICD-10-CM | POA: Diagnosis not present

## 2014-05-22 ENCOUNTER — Telehealth: Payer: Self-pay | Admitting: Family Medicine

## 2014-05-22 DIAGNOSIS — M5416 Radiculopathy, lumbar region: Secondary | ICD-10-CM

## 2014-05-22 MED ORDER — GABAPENTIN 100 MG PO CAPS: 100.0000 mg | ORAL_CAPSULE | Freq: Three times a day (TID) | ORAL | Status: DC

## 2014-05-22 NOTE — Telephone Encounter (Signed)
Call to Allen [name] (daughter in law) regarding XR lumbar spine. Dextroscoliosis, disk flattening and osteoarthritis. Restart gabapentin 100mg  tid; may use sparing doses of ibuprofen 200-400mg  once or twice daily as needed for severe pain (with food and ample hydration). MRI without contrast LS spine to consider epidural injections for analgesia.  JB

## 2014-06-11 ENCOUNTER — Other Ambulatory Visit: Payer: Medicare Other

## 2014-10-01 ENCOUNTER — Other Ambulatory Visit: Payer: Self-pay | Admitting: Family Medicine

## 2014-10-03 ENCOUNTER — Ambulatory Visit (INDEPENDENT_AMBULATORY_CARE_PROVIDER_SITE_OTHER): Payer: Medicare Other | Admitting: *Deleted

## 2014-10-03 ENCOUNTER — Encounter: Payer: Self-pay | Admitting: Internal Medicine

## 2014-10-03 DIAGNOSIS — Z950 Presence of cardiac pacemaker: Secondary | ICD-10-CM

## 2014-10-03 DIAGNOSIS — I441 Atrioventricular block, second degree: Secondary | ICD-10-CM

## 2014-10-03 LAB — MDC_IDC_ENUM_SESS_TYPE_INCLINIC
Brady Statistic AP VP Percent: 92 %
Brady Statistic AS VP Percent: 8 %
Implantable Pulse Generator Model: 349799
Implantable Pulse Generator Serial Number: 76135480
Lead Channel Impedance Value: 869 Ohm
Lead Channel Pacing Threshold Amplitude: 0.5 V
Lead Channel Pacing Threshold Amplitude: 0.7 V
Lead Channel Pacing Threshold Pulse Width: 0.4 ms
Lead Channel Sensing Intrinsic Amplitude: 16.1 mV
Lead Channel Sensing Intrinsic Amplitude: 4.1 mV
Lead Channel Setting Pacing Amplitude: 2 V
Lead Channel Setting Pacing Pulse Width: 0.4 ms
Lead Channel Setting Sensing Sensitivity: 2.5 mV
MDC IDC MSMT BATTERY VOLTAGE: 2.7 V
MDC IDC MSMT LEADCHNL RA IMPEDANCE VALUE: 510 Ohm
MDC IDC MSMT LEADCHNL RA PACING THRESHOLD PULSEWIDTH: 0.4 ms
MDC IDC SET LEADCHNL RA PACING AMPLITUDE: 2 V
MDC IDC STAT BRADY AP VS PERCENT: 0 %
MDC IDC STAT BRADY AS VS PERCENT: 0 %

## 2014-10-03 NOTE — Progress Notes (Signed)
Pacemaker check in clinic. Normal device function. Thresholds, sensing, impedances consistent with previous measurements. Device programmed to maximize longevity. 217 mode switches---longest 4.36min, max170bpm. 1 HVR---6 beats. Device programmed at appropriate safety margins. Histogram distribution appropriate for patient activity level. Device programmed to optimize intrinsic conduction---attempted to extend AV delay, pt's intrinsic initially 295ms then extended to 363ms. Estimated longevity 0.1352yrs. ROV w/ device clinic 11/14/14 for battery check, next billable Aug. ROV w/ JA 10/16.

## 2014-10-04 DIAGNOSIS — N401 Enlarged prostate with lower urinary tract symptoms: Secondary | ICD-10-CM | POA: Diagnosis not present

## 2014-10-04 DIAGNOSIS — N281 Cyst of kidney, acquired: Secondary | ICD-10-CM | POA: Diagnosis not present

## 2014-10-04 DIAGNOSIS — N138 Other obstructive and reflux uropathy: Secondary | ICD-10-CM | POA: Diagnosis not present

## 2014-10-04 DIAGNOSIS — R3912 Poor urinary stream: Secondary | ICD-10-CM | POA: Diagnosis not present

## 2014-10-04 DIAGNOSIS — N500 Atrophy of testis: Secondary | ICD-10-CM | POA: Diagnosis not present

## 2014-11-14 ENCOUNTER — Ambulatory Visit (INDEPENDENT_AMBULATORY_CARE_PROVIDER_SITE_OTHER): Payer: Medicare Other | Admitting: *Deleted

## 2014-11-14 DIAGNOSIS — I495 Sick sinus syndrome: Secondary | ICD-10-CM

## 2014-11-14 LAB — CUP PACEART INCLINIC DEVICE CHECK
MDC IDC PG SERIAL: 76135480
MDC IDC SESS DTM: 20160608111634

## 2014-11-14 NOTE — Progress Notes (Signed)
Pacemaker check in clinic. Battery longevity 6 months. ROV 01-23-15 @ 1030 with device clinic and October with JA.

## 2014-12-13 ENCOUNTER — Encounter: Payer: Self-pay | Admitting: Internal Medicine

## 2015-01-13 ENCOUNTER — Other Ambulatory Visit: Payer: Self-pay | Admitting: Family Medicine

## 2015-01-23 ENCOUNTER — Encounter: Payer: Self-pay | Admitting: Internal Medicine

## 2015-01-23 ENCOUNTER — Ambulatory Visit (INDEPENDENT_AMBULATORY_CARE_PROVIDER_SITE_OTHER): Payer: Medicare Other | Admitting: *Deleted

## 2015-01-23 DIAGNOSIS — I441 Atrioventricular block, second degree: Secondary | ICD-10-CM

## 2015-01-23 LAB — CUP PACEART INCLINIC DEVICE CHECK
Battery Impedance: 3300 Ohm
Battery Remaining Longevity: 3
Battery Voltage: 2.68 V
Brady Statistic RA Percent Paced: 91 %
Date Time Interrogation Session: 20160817134616
Lead Channel Impedance Value: 523 Ohm
Lead Channel Impedance Value: 869 Ohm
Lead Channel Pacing Threshold Amplitude: 0.5 V
Lead Channel Pacing Threshold Pulse Width: 0.4 ms
Lead Channel Sensing Intrinsic Amplitude: 17.4 mV
Lead Channel Sensing Intrinsic Amplitude: 5.4 mV
Lead Channel Setting Pacing Amplitude: 2 V
Lead Channel Setting Pacing Pulse Width: 0.4 ms
MDC IDC MSMT LEADCHNL RA PACING THRESHOLD AMPLITUDE: 0.7 V
MDC IDC MSMT LEADCHNL RV PACING THRESHOLD PULSEWIDTH: 0.4 ms
MDC IDC PG SERIAL: 76135480
MDC IDC SET LEADCHNL RV PACING AMPLITUDE: 2 V
MDC IDC SET LEADCHNL RV SENSING SENSITIVITY: 2.5 mV
MDC IDC STAT BRADY RV PERCENT PACED: 100 %
Pulse Gen Model: 349799

## 2015-01-23 NOTE — Progress Notes (Signed)
Pacemaker check in clinic. Normal device function. Thresholds, sensing, impedances consistent with previous measurements. Device programmed to maximize longevity. (19) mode switches---max dur. 2 mins, Max V 139--AT per EGMs. (1) high ventricular rate noted x 8 bts @ 207bpm. Device programmed at appropriate safety margins. Histogram distribution appropriate for patient activity level. Device programmed to optimize intrinsic conduction. Estimated longevity 3 months. Patient will follow up with the Device Clinic on 9/28  (batt only) and with JA on 04/03/2015 @ 0945.

## 2015-03-06 ENCOUNTER — Ambulatory Visit (INDEPENDENT_AMBULATORY_CARE_PROVIDER_SITE_OTHER): Payer: Medicare Other | Admitting: *Deleted

## 2015-03-06 DIAGNOSIS — Z45010 Encounter for checking and testing of cardiac pacemaker pulse generator [battery]: Secondary | ICD-10-CM

## 2015-03-06 LAB — CUP PACEART INCLINIC DEVICE CHECK
Date Time Interrogation Session: 20160928093604
Lead Channel Setting Pacing Amplitude: 2 V
MDC IDC PG SERIAL: 76135480
MDC IDC SET LEADCHNL RA PACING AMPLITUDE: 2 V
MDC IDC SET LEADCHNL RV PACING PULSEWIDTH: 0.4 ms
MDC IDC SET LEADCHNL RV SENSING SENSITIVITY: 2.5 mV
Pulse Gen Model: 349799

## 2015-03-06 NOTE — Progress Notes (Signed)
Battery check only. Remaining longevity 9mo. 2 new mode switches, longest 38sec. ROV w/ JA 04/03/15.

## 2015-03-29 ENCOUNTER — Encounter: Payer: Self-pay | Admitting: Internal Medicine

## 2015-03-30 ENCOUNTER — Other Ambulatory Visit: Payer: Self-pay | Admitting: Family Medicine

## 2015-04-03 ENCOUNTER — Encounter: Payer: Self-pay | Admitting: Internal Medicine

## 2015-04-03 ENCOUNTER — Ambulatory Visit (INDEPENDENT_AMBULATORY_CARE_PROVIDER_SITE_OTHER): Payer: Medicare Other | Admitting: Internal Medicine

## 2015-04-03 VITALS — BP 134/78 | HR 67 | Ht 63.0 in | Wt 132.4 lb

## 2015-04-03 DIAGNOSIS — I441 Atrioventricular block, second degree: Secondary | ICD-10-CM | POA: Diagnosis not present

## 2015-04-03 DIAGNOSIS — Z23 Encounter for immunization: Secondary | ICD-10-CM | POA: Diagnosis not present

## 2015-04-03 DIAGNOSIS — Z950 Presence of cardiac pacemaker: Secondary | ICD-10-CM

## 2015-04-03 LAB — CUP PACEART INCLINIC DEVICE CHECK
Battery Impedance: 3500 Ohm
Implantable Lead Implant Date: 20101018
Implantable Lead Location: 753859
Lead Channel Impedance Value: 869 Ohm
Lead Channel Pacing Threshold Amplitude: 0.5 V
Lead Channel Pacing Threshold Pulse Width: 0.4 ms
Lead Channel Pacing Threshold Pulse Width: 0.4 ms
Lead Channel Sensing Intrinsic Amplitude: 3.1 mV
Lead Channel Setting Pacing Amplitude: 2 V
Lead Channel Setting Pacing Pulse Width: 0.4 ms
Lead Channel Setting Sensing Sensitivity: 2.5 mV
MDC IDC LEAD IMPLANT DT: 20101018
MDC IDC LEAD LOCATION: 753860
MDC IDC LEAD MODEL: 246367
MDC IDC LEAD SERIAL: 28583747
MDC IDC MSMT BATTERY VOLTAGE: 2.67 V
MDC IDC MSMT LEADCHNL RA IMPEDANCE VALUE: 510 Ohm
MDC IDC MSMT LEADCHNL RA PACING THRESHOLD AMPLITUDE: 0.7 V
MDC IDC MSMT LEADCHNL RA SENSING INTR AMPL: 4.3 mV
MDC IDC SESS DTM: 20161026141000
MDC IDC SET LEADCHNL RV PACING AMPLITUDE: 2 V
MDC IDC STAT BRADY RA PERCENT PACED: 91 %
MDC IDC STAT BRADY RV PERCENT PACED: 100 %
Pulse Gen Serial Number: 76135480

## 2015-04-03 NOTE — Patient Instructions (Signed)
Medication Instructions:  Your physician recommends that you continue on your current medications as directed. Please refer to the Current Medication list given to you today.   Labwork: None ordered   Testing/Procedures: None ordered   Follow-Up: Your physician recommends that you schedule a follow-up appointment in: 4 weeks in the device clinic   Any Other Special Instructions Will Be Listed Below (If Applicable).     If you need a refill on your cardiac medications before your next appointment, please call your pharmacy.

## 2015-04-09 ENCOUNTER — Ambulatory Visit (INDEPENDENT_AMBULATORY_CARE_PROVIDER_SITE_OTHER): Payer: Medicare Other | Admitting: Family Medicine

## 2015-04-09 ENCOUNTER — Encounter: Payer: Self-pay | Admitting: Family Medicine

## 2015-04-09 VITALS — BP 148/63 | HR 67 | Temp 98.3°F | Ht 63.0 in | Wt 132.6 lb

## 2015-04-09 DIAGNOSIS — I10 Essential (primary) hypertension: Secondary | ICD-10-CM | POA: Diagnosis not present

## 2015-04-09 DIAGNOSIS — R413 Other amnesia: Secondary | ICD-10-CM | POA: Diagnosis not present

## 2015-04-09 DIAGNOSIS — I441 Atrioventricular block, second degree: Secondary | ICD-10-CM | POA: Diagnosis not present

## 2015-04-09 MED ORDER — OLOPATADINE HCL 0.2 % OP SOLN: OPHTHALMIC | Status: DC

## 2015-04-09 NOTE — Progress Notes (Signed)
ELECTROPHYSIOLOGY OFFICE NOTE  Patient ID: Allen [name] MRN: [id number], DOB/AGE: 79-Jul-1920   Date of Visit: 04/09/2015  Primary Physician: Paula Compton, MD Primary Cardiologist: Hillis Range, MD Reason for Visit: EP/device follow-up  History of Present Illness  Allen [name] is a 81 y.o. male with prior syncope with documented second degree AV block s/p PPM implant 2010 who presents today for routine electrophysiology followup.  He is doing well at this time for his age.  He is not very active but enjoys reading [employer].  He denies chest pain or shortness of breath. He denies palpitations, dizziness, near syncope or syncope. He denies LE swelling, orthopnea, PND or recent weight gain. He is compliant with his medications.  Past Medical History Past Medical History  Diagnosis Date  . Pacemaker   . Hypertension   . GERD (gastroesophageal reflux disease)   . H/O hiatal hernia   . Arthritis     hands and feet  . Anemia   . Memory loss   . TSH elevation   . Risk for falls   . Prostatic hypertrophy, benign, with obstruction   . Hyperlipidemia   . Pacemaker 2010  . Syncope   . Dizziness   . Hearing loss   . Restless legs syndrome (RLS)   . Lumbar radiculopathy, chronic   . Complicated UTI (urinary tract infection)     Past Surgical History Past Surgical History  Procedure Laterality Date  . Testicle surgery  1933  . Inguinal hernia repair  1980  . Pacemaker placement  03/2009    syncope - bradycardia, and 2nd degree block   . Hernia repair    . Esophagogastroduodenoscopy  01/29/2012    Procedure: ESOPHAGOGASTRODUODENOSCOPY (EGD);  Surgeon: Barrie Folk, MD;  Location: [facility] ENDOSCOPY;  Service: Endoscopy;  Laterality: N/A;  Intrepreter scheduled 930-12.    Allergies/Intolerances Allergies  Allergen Reactions  . Flomax [Tamsulosin Hcl] Other (See Comments)    Pt becomes dizzy and passes out.    Current Home Medications Current  Outpatient Prescriptions  Medication Sig Dispense Refill  . aspirin EC 81 MG tablet Take 81 mg by mouth daily.      Marland Kitchen CALCIUM-VITAMIN D PO Take 1 capsule by mouth daily.    Marland Kitchen donepezil (ARICEPT) 5 MG tablet Tome 1 tableta por la boca  al acostarse 90 tablet 0  . finasteride (PROSCAR) 5 MG tablet Tome 1 tableta por la boca  diario 90 tablet 0  . Fish Oil OIL Take 1 capsule by mouth 3 (three) times daily.     Marland Kitchen gabapentin (NEURONTIN) 100 MG capsule Tome 1 capsula por la boca  3 veces al dia 90 capsule 3  . omeprazole (PRILOSEC) 20 MG capsule Tome 1 capsula por la boca  dos veces al dia 180 capsule 0  . pravastatin (PRAVACHOL) 10 MG tablet Tome 1 tableta por la boca  diario 90 tablet 1  . RAPAFLO 8 MG CAPS capsule Tome 1 capsula por la boca  diario con el desayuno 90 capsule 0  . Olopatadine HCl (PATADAY) 0.2 % SOLN Use 1 drop in each eye daily. 2.5 mL 2   No current facility-administered medications for this visit.    Social History Social History  . Marital Status: Married   Social History Main Topics  . Smoking status: Former Games developer  . Smokeless tobacco: Not on file  . Alcohol Use: No  . Drug Use: No   Social History Narrative   Born in [place], lived  in [place] until 2005   Retired farmer   Catholic faith   Sometimes stays with daughter in [place].    3 children in [place], lives with wife and son.   daugher, Jacky KindleSantijuana Gutierrez, is main caregiver.    Has been in [place] for about 1 year. Wife lives [place] in [place]. Smoked when he was younger for about 20 years (6-7 cigarettes/day).    Review of Systems All systems reviewed and are otherwise negative except as noted above.  Physical Exam Vitals: Blood pressure 134/78, pulse 67, height 5\' 3"  (1.6 m), weight 132 lb 6.4 oz (60.056 kg).  General: Well developed, well appearing 81 y.o. male in no acute distress. He appears younger than his stated age. HEENT: Normocephalic, atraumatic. EOMs intact. Sclera nonicteric.  Oropharynx clear.  Neck: Supple. No JVD. Lungs: Respirations regular and unlabored, CTA bilaterally. No wheezes, rales or rhonchi. Heart: RRR. S1, S2 present. No murmurs, rub, S3 or S4. Abdomen: Soft, non-distended. Extremities: No clubbing, cyanosis or edema. PT/Radials 2+ and equal bilaterally. Psych: Normal affect. Neuro: Alert and oriented X 3. Moves all extremities spontaneously.  Device interrogation today - Normal device function.    Assessment and Plan 1. Symptomatic bradycardia with second-degree AV block s/p PPM implant - Normal device function - approaching ERI We discussed risks, benefits, and alternatives to generator replacement with the patient today who wishes to proceed once ERI.  He will require a translator at the time of his procedure. I will like switch from [employer] to SJM at time of generator change.  2. Hypertension Stable No change required today  Hillis RangeJames Taite Schoeppner MD

## 2015-04-09 NOTE — Progress Notes (Signed)
Subjective:    Lindalou Hosenrique Gutierrez Gutierrez is a 81 y.o. male who presents to [facility] today for several issues:  1.   Hypertension:  Long-term problem for this patient.  No adverse effects from medication.  Not checking it regularly.  No HA, CP, dizziness, shortness of breath, palpitations, or LE swelling.   BP Readings from Last 3 Encounters:  04/09/15 148/63  04/03/15 134/78  04/27/14 158/80   2.  Memory loss:  Fairly stable.  Still forgetful about things, but can complete his ADLs.  Taking Aricept.  He is not wandering/getting lost around home.  Memory for distant/remote memory still very good, mostly short-term deficits.   ROS as above per HPI, otherwise neg.    The following portions of the patient's history were reviewed and updated as appropriate: allergies, current medications, past medical history, family and social history, and problem list. Patient is a nonsmoker.    PMH reviewed.  Past Medical History  Diagnosis Date  . Pacemaker   . Hypertension   . GERD (gastroesophageal reflux disease)   . H/O hiatal hernia   . Arthritis     hands and feet  . Anemia   . Memory loss   . TSH elevation   . Risk for falls   . Prostatic hypertrophy, benign, with obstruction   . Hyperlipidemia   . Pacemaker 2010  . Syncope   . Dizziness   . Hearing loss   . Restless legs syndrome (RLS)   . Lumbar radiculopathy, chronic   . Complicated UTI (urinary tract infection)    Past Surgical History  Procedure Laterality Date  . Testicle surgery  1933  . Inguinal hernia repair  1980  . Pacemaker placement  03/2009    syncope - bradycardia, and 2nd degree block   . Hernia repair    . Esophagogastroduodenoscopy  01/29/2012    Procedure: ESOPHAGOGASTRODUODENOSCOPY (EGD);  Surgeon: Barrie FolkJohn C Hayes, MD;  [facility];  Service: Endoscopy;  Laterality: N/A;  Intrepreter scheduled 930-12.    Medications reviewed. Current Outpatient Prescriptions  Medication Sig Dispense Refill  .  aspirin EC 81 MG tablet Take 81 mg by mouth daily.      Marland Kitchen. CALCIUM-VITAMIN D PO Take 1 capsule by mouth daily.    Marland Kitchen. donepezil (ARICEPT) 5 MG tablet Tome 1 tableta por la boca  al acostarse 90 tablet 0  . finasteride (PROSCAR) 5 MG tablet Tome 1 tableta por la boca  diario 90 tablet 0  . Fish Oil OIL Take 1 capsule by mouth 3 (three) times daily.     Marland Kitchen. gabapentin (NEURONTIN) 100 MG capsule Tome 1 capsula por la boca  3 veces al dia 90 capsule 3  . omeprazole (PRILOSEC) 20 MG capsule Tome 1 capsula por la boca  dos veces al dia 180 capsule 0  . pravastatin (PRAVACHOL) 10 MG tablet Tome 1 tableta por la boca  diario 90 tablet 1  . RAPAFLO 8 MG CAPS capsule Tome 1 capsula por la boca  diario con el desayuno 90 capsule 0   No current facility-administered medications for this visit.     Objective:   Physical Exam BP 148/63 mmHg  Pulse 67  Temp(Src) 98.3 F (36.8 C) (Oral)  Ht 5\' 3"  (1.6 m)  Wt 132 lb 9.6 oz (60.147 kg)  BMI 23.49 kg/m2  SpO2 95% Gen:  Alert, cooperative patient who appears stated age in no acute distress.  Vital signs reviewed. HEENT: EOMI,  MMM Cardiac:  Regular rate and  rhythm without murmur auscultated.  Good S1/S2. Pulm:  Clear to auscultation bilaterally with good air movement.  No wheezes or rales noted.   Exts: Non edematous BL  LE, warm and well perfused.  Neuro:  Pleasant, no focal deficits noted.   No results found for this or any previous visit (from the past 72 hour(s)).

## 2015-04-09 NOTE — Patient Instructions (Signed)
It was good to see you today.  I have sent in eye drops for your eyes.  We have power of attorney paperwork as well.

## 2015-04-11 NOTE — Assessment & Plan Note (Signed)
Unchanged.   On Aricept.   No concerns for any recent worsening.

## 2015-04-11 NOTE — Assessment & Plan Note (Signed)
Followed closely by Dr. Johney FrameAllred

## 2015-04-11 NOTE — Assessment & Plan Note (Signed)
At goal, no changes

## 2015-05-01 ENCOUNTER — Ambulatory Visit (INDEPENDENT_AMBULATORY_CARE_PROVIDER_SITE_OTHER): Payer: Medicare Other | Admitting: *Deleted

## 2015-05-01 DIAGNOSIS — I441 Atrioventricular block, second degree: Secondary | ICD-10-CM

## 2015-05-01 DIAGNOSIS — I495 Sick sinus syndrome: Secondary | ICD-10-CM

## 2015-05-01 LAB — CUP PACEART INCLINIC DEVICE CHECK
Date Time Interrogation Session: 20161123172231
Implantable Lead Implant Date: 20101018
Implantable Lead Location: 753859
Lead Channel Setting Pacing Pulse Width: 0.4 ms
Lead Channel Setting Sensing Sensitivity: 2.5 mV
MDC IDC LEAD IMPLANT DT: 20101018
MDC IDC LEAD LOCATION: 753860
MDC IDC LEAD MODEL: 246367
MDC IDC LEAD SERIAL: 28583747
MDC IDC PG SERIAL: 76135480
MDC IDC SET LEADCHNL RA PACING AMPLITUDE: 2 V
MDC IDC SET LEADCHNL RV PACING AMPLITUDE: 2 V

## 2015-05-01 NOTE — Progress Notes (Signed)
Battery check only (N/C). Remaining longevity 23mo. (15) new mode switches, longest 1.3 mins---AT per EGMs. ROV w/ the Device Clinic in 1 month (next billable 06-2015).

## 2015-05-06 ENCOUNTER — Other Ambulatory Visit: Payer: Self-pay | Admitting: *Deleted

## 2015-05-06 MED ORDER — SILODOSIN 8 MG PO CAPS: ORAL_CAPSULE | ORAL | Status: DC

## 2015-05-06 MED ORDER — FINASTERIDE 5 MG PO TABS: ORAL_TABLET | ORAL | Status: DC

## 2015-05-06 MED ORDER — GABAPENTIN 100 MG PO CAPS: ORAL_CAPSULE | ORAL | Status: DC

## 2015-05-06 MED ORDER — DONEPEZIL HCL 5 MG PO TABS: ORAL_TABLET | ORAL | Status: DC

## 2015-05-06 NOTE — Telephone Encounter (Signed)
Patient is almost out of medications.  Clovis PuMartin, Tamika L, RN

## 2015-05-07 ENCOUNTER — Other Ambulatory Visit: Payer: Self-pay | Admitting: *Deleted

## 2015-05-08 MED ORDER — PRAVASTATIN SODIUM 10 MG PO TABS: ORAL_TABLET | ORAL | Status: DC

## 2015-05-10 ENCOUNTER — Encounter: Payer: Self-pay | Admitting: Internal Medicine

## 2015-05-30 ENCOUNTER — Encounter: Payer: Self-pay | Admitting: Internal Medicine

## 2015-05-30 ENCOUNTER — Ambulatory Visit (INDEPENDENT_AMBULATORY_CARE_PROVIDER_SITE_OTHER): Payer: Medicare Other | Admitting: *Deleted

## 2015-05-30 DIAGNOSIS — I441 Atrioventricular block, second degree: Secondary | ICD-10-CM

## 2015-05-30 LAB — CUP PACEART INCLINIC DEVICE CHECK
Date Time Interrogation Session: 20161222121612
Implantable Lead Implant Date: 20101018
Implantable Lead Location: 753859
Implantable Lead Model: 5076
Implantable Lead Serial Number: 28583747
Lead Channel Setting Pacing Amplitude: 2 V
Lead Channel Setting Pacing Pulse Width: 0.4 ms
Lead Channel Setting Sensing Sensitivity: 2.5 mV
MDC IDC LEAD IMPLANT DT: 20101018
MDC IDC LEAD LOCATION: 753860
MDC IDC LEAD MODEL: 246367
MDC IDC PG SERIAL: 76135480
MDC IDC SET LEADCHNL RA PACING AMPLITUDE: 2 V

## 2015-05-30 NOTE — Progress Notes (Signed)
Battery check only (N/C). Remaining longevity 1 mo. (17) new mode switches, longest 40 seconds---AT per EGMs. ROV w/ the Device Clinic in 1 month (billable).

## 2015-07-01 ENCOUNTER — Encounter: Payer: Self-pay | Admitting: Internal Medicine

## 2015-07-01 ENCOUNTER — Ambulatory Visit (INDEPENDENT_AMBULATORY_CARE_PROVIDER_SITE_OTHER): Payer: Medicare Other | Admitting: *Deleted

## 2015-07-01 DIAGNOSIS — I441 Atrioventricular block, second degree: Secondary | ICD-10-CM | POA: Diagnosis not present

## 2015-07-01 DIAGNOSIS — I495 Sick sinus syndrome: Secondary | ICD-10-CM | POA: Diagnosis not present

## 2015-07-01 LAB — CUP PACEART INCLINIC DEVICE CHECK
Battery Impedance: 4100 Ohm
Battery Voltage: 2.65 V
Implantable Lead Implant Date: 20101018
Implantable Lead Implant Date: 20101018
Implantable Lead Location: 753860
Implantable Lead Model: 246367
Implantable Lead Model: 5076
Implantable Lead Serial Number: 28583747
Lead Channel Impedance Value: 523 Ohm
Lead Channel Pacing Threshold Amplitude: 0.8 V
Lead Channel Pacing Threshold Pulse Width: 0.4 ms
Lead Channel Pacing Threshold Pulse Width: 0.4 ms
Lead Channel Sensing Intrinsic Amplitude: 4.5 mV
Lead Channel Setting Pacing Amplitude: 2 V
Lead Channel Setting Pacing Amplitude: 2 V
MDC IDC LEAD LOCATION: 753859
MDC IDC MSMT LEADCHNL RV IMPEDANCE VALUE: 869 Ohm
MDC IDC MSMT LEADCHNL RV PACING THRESHOLD AMPLITUDE: 0.5 V
MDC IDC MSMT LEADCHNL RV SENSING INTR AMPL: 3.2 mV
MDC IDC SESS DTM: 20170123130656
MDC IDC SET LEADCHNL RV PACING PULSEWIDTH: 0.4 ms
MDC IDC SET LEADCHNL RV SENSING SENSITIVITY: 2.5 mV
Pulse Gen Serial Number: 76135480

## 2015-07-01 NOTE — Progress Notes (Signed)
Pacemaker check in clinic. Normal device function. Thresholds, sensing, impedances consistent with previous measurements. Device programmed to maximize longevity.  (7) new mode switches---max dur. 20 sec---AT per EGMs. No high ventricular rates noted. Device programmed at appropriate safety margins. Histogram distribution appropriate for patient activity level. Device programmed to optimize intrinsic conduction. Estimated longevity 1 month remaining (Batt status is OK). Patient will follow up with the Device Clinic on 2/20 (batt only/next billable 09-2015).

## 2015-08-02 ENCOUNTER — Encounter: Payer: Self-pay | Admitting: Internal Medicine

## 2015-08-02 ENCOUNTER — Ambulatory Visit (INDEPENDENT_AMBULATORY_CARE_PROVIDER_SITE_OTHER): Payer: Medicare Other | Admitting: *Deleted

## 2015-08-02 DIAGNOSIS — I441 Atrioventricular block, second degree: Secondary | ICD-10-CM

## 2015-08-02 DIAGNOSIS — I495 Sick sinus syndrome: Secondary | ICD-10-CM

## 2015-08-02 LAB — CUP PACEART INCLINIC DEVICE CHECK
Battery Remaining Longevity: 0 mo
Date Time Interrogation Session: 20170224133240
Implantable Lead Implant Date: 20101018
Implantable Lead Location: 753859
Implantable Lead Location: 753860
Implantable Lead Model: 246367
Lead Channel Setting Pacing Pulse Width: 0.4 ms
Lead Channel Setting Sensing Sensitivity: 2.5 mV
MDC IDC LEAD IMPLANT DT: 20101018
MDC IDC LEAD SERIAL: 28583747
MDC IDC SET LEADCHNL RA PACING AMPLITUDE: 2 V
MDC IDC SET LEADCHNL RV PACING AMPLITUDE: 2 V
Pulse Gen Serial Number: 76135480

## 2015-08-02 NOTE — Progress Notes (Signed)
Battery check only (N/C). Remaining longevity 0 years, 0 mos. ROV w/ the Device Clinic in 1 month (next billable 09-2015).

## 2015-08-03 ENCOUNTER — Other Ambulatory Visit: Payer: Self-pay | Admitting: Family Medicine

## 2015-08-16 ENCOUNTER — Telehealth: Payer: Self-pay | Admitting: *Deleted

## 2015-08-16 NOTE — Telephone Encounter (Signed)
Informed daughter in law that [facility]. Will have Anselm PancoastKelly Lanier,RN call patient on Monday to schedule procedure.   Daughter in law voiced understanding.

## 2015-08-16 NOTE — Telephone Encounter (Signed)
LMTCB/SSS 

## 2015-08-19 ENCOUNTER — Telehealth: Payer: Self-pay | Admitting: Internal Medicine

## 2015-08-19 NOTE — Telephone Encounter (Signed)
Calling about scheduling surgery for Mr. Allen [name] .   Thanks

## 2015-08-21 NOTE — Telephone Encounter (Signed)
Spoke with daughter aware of generator change at [facility] on 09/03/15.  He will have his labs done at the hospital.  To be at the hospital at 9:30am

## 2015-09-02 ENCOUNTER — Other Ambulatory Visit: Payer: Self-pay | Admitting: Physician Assistant

## 2015-09-03 ENCOUNTER — Other Ambulatory Visit: Payer: Self-pay

## 2015-09-03 ENCOUNTER — Ambulatory Visit (HOSPITAL_COMMUNITY)
Admission: RE | Admit: 2015-09-03 | Discharge: 2015-09-03 | Disposition: A | Discharge status: Home / Self Care | Payer: Medicare Other | Source: Ambulatory Visit | Attending: Internal Medicine | Admitting: Internal Medicine

## 2015-09-03 ENCOUNTER — Encounter (HOSPITAL_COMMUNITY)
Admission: RE | Disposition: A | Discharge status: Home / Self Care | Payer: Self-pay | Source: Ambulatory Visit | Attending: Internal Medicine

## 2015-09-03 ENCOUNTER — Encounter (HOSPITAL_COMMUNITY): Payer: Self-pay | Admitting: Internal Medicine

## 2015-09-03 DIAGNOSIS — Z45010 Encounter for checking and testing of cardiac pacemaker pulse generator [battery]: Secondary | ICD-10-CM | POA: Diagnosis not present

## 2015-09-03 DIAGNOSIS — I441 Atrioventricular block, second degree: Secondary | ICD-10-CM | POA: Insufficient documentation

## 2015-09-03 DIAGNOSIS — N138 Other obstructive and reflux uropathy: Secondary | ICD-10-CM | POA: Diagnosis not present

## 2015-09-03 DIAGNOSIS — M1990 Unspecified osteoarthritis, unspecified site: Secondary | ICD-10-CM | POA: Insufficient documentation

## 2015-09-03 DIAGNOSIS — I10 Essential (primary) hypertension: Secondary | ICD-10-CM | POA: Diagnosis not present

## 2015-09-03 DIAGNOSIS — Z7982 Long term (current) use of aspirin: Secondary | ICD-10-CM | POA: Insufficient documentation

## 2015-09-03 DIAGNOSIS — E785 Hyperlipidemia, unspecified: Secondary | ICD-10-CM | POA: Insufficient documentation

## 2015-09-03 DIAGNOSIS — N401 Enlarged prostate with lower urinary tract symptoms: Secondary | ICD-10-CM | POA: Diagnosis not present

## 2015-09-03 DIAGNOSIS — Z87891 Personal history of nicotine dependence: Secondary | ICD-10-CM | POA: Diagnosis not present

## 2015-09-03 DIAGNOSIS — R55 Syncope and collapse: Secondary | ICD-10-CM | POA: Insufficient documentation

## 2015-09-03 DIAGNOSIS — K219 Gastro-esophageal reflux disease without esophagitis: Secondary | ICD-10-CM | POA: Insufficient documentation

## 2015-09-03 DIAGNOSIS — G2581 Restless legs syndrome: Secondary | ICD-10-CM | POA: Diagnosis not present

## 2015-09-03 DIAGNOSIS — M5416 Radiculopathy, lumbar region: Secondary | ICD-10-CM | POA: Insufficient documentation

## 2015-09-03 HISTORY — PX: EP IMPLANTABLE DEVICE: SHX172B

## 2015-09-03 LAB — CBC
HEMATOCRIT: 39.8 % (ref 39.0–52.0)
HEMOGLOBIN: 13.5 g/dL (ref 13.0–17.0)
MCH: 31.6 pg (ref 26.0–34.0)
MCHC: 33.9 g/dL (ref 30.0–36.0)
MCV: 93.2 fL (ref 78.0–100.0)
Platelets: 213 10*3/uL (ref 150–400)
RBC: 4.27 MIL/uL (ref 4.22–5.81)
RDW: 12.6 % (ref 11.5–15.5)
WBC: 4.3 10*3/uL (ref 4.0–10.5)

## 2015-09-03 LAB — BASIC METABOLIC PANEL
Anion gap: 8 (ref 5–15)
BUN: 8 mg/dL (ref 6–20)
CALCIUM: 9.1 mg/dL (ref 8.9–10.3)
CHLORIDE: 104 mmol/L (ref 101–111)
CO2: 23 mmol/L (ref 22–32)
CREATININE: 1.1 mg/dL (ref 0.61–1.24)
GFR calc Af Amer: >60 mL/min (ref >60)
GFR calc non Af Amer: 55 mL/min — ABNORMAL LOW (ref >60)
Glucose, Bld: 94 mg/dL (ref 65–99)
Potassium: 3.9 mmol/L (ref 3.5–5.1)
Sodium: 135 mmol/L (ref 135–145)

## 2015-09-03 LAB — SURGICAL PCR SCREEN
MRSA, PCR: NEGATIVE
Staphylococcus aureus: NEGATIVE

## 2015-09-03 SURGERY — PPM/BIV PPM GENERATOR CHANGEOUT

## 2015-09-03 MED ORDER — SODIUM CHLORIDE 0.9 % IV SOLN
INTRAVENOUS | Status: DC
  Administered 2015-09-03: 10:00:00 via INTRAVENOUS

## 2015-09-03 MED ORDER — SODIUM CHLORIDE 0.9% FLUSH: 3.0000 mL | Freq: Two times a day (BID) | INTRAVENOUS | Status: DC

## 2015-09-03 MED ORDER — MUPIROCIN 2 % EX OINT
TOPICAL_OINTMENT | CUTANEOUS | Status: AC
  Filled 2015-09-03: qty 22

## 2015-09-03 MED ORDER — LIDOCAINE HCL (PF) 1 % IJ SOLN
INTRAMUSCULAR | Status: AC
  Filled 2015-09-03: qty 30

## 2015-09-03 MED ORDER — SODIUM CHLORIDE 0.9 % IR SOLN
80.0000 mg | Status: AC
  Administered 2015-09-03: 80 mg
  Filled 2015-09-03: qty 2

## 2015-09-03 MED ORDER — CEFAZOLIN SODIUM-DEXTROSE 2-3 GM-% IV SOLR
INTRAVENOUS | Status: AC
  Filled 2015-09-03: qty 50

## 2015-09-03 MED ORDER — ACETAMINOPHEN 325 MG PO TABS: 325.0000 mg | ORAL_TABLET | ORAL | Status: DC | PRN

## 2015-09-03 MED ORDER — LIDOCAINE HCL (PF) 1 % IJ SOLN
INTRAMUSCULAR | Status: DC | PRN
  Administered 2015-09-03: 31 mL

## 2015-09-03 MED ORDER — SODIUM CHLORIDE 0.9 % IV SOLN: 250.0000 mL | INTRAVENOUS | Status: DC | PRN

## 2015-09-03 MED ORDER — MUPIROCIN 2 % EX OINT
1.0000 "application " | TOPICAL_OINTMENT | Freq: Once | CUTANEOUS | Status: AC
  Administered 2015-09-03: 1 via TOPICAL

## 2015-09-03 MED ORDER — SODIUM CHLORIDE 0.9% FLUSH: 3.0000 mL | INTRAVENOUS | Status: DC | PRN

## 2015-09-03 MED ORDER — ONDANSETRON HCL 4 MG/2ML IJ SOLN: 4.0000 mg | Freq: Four times a day (QID) | INTRAMUSCULAR | Status: DC | PRN

## 2015-09-03 MED ORDER — SODIUM CHLORIDE 0.9 % IR SOLN
Status: AC
  Filled 2015-09-03: qty 2

## 2015-09-03 MED ORDER — CHLORHEXIDINE GLUCONATE 4 % EX LIQD: 60.0000 mL | Freq: Once | CUTANEOUS | Status: DC

## 2015-09-03 MED ORDER — CEFAZOLIN SODIUM-DEXTROSE 2-4 GM/100ML-% IV SOLN
2.0000 g | INTRAVENOUS | Status: AC
  Administered 2015-09-03: 2 g via INTRAVENOUS

## 2015-09-03 SURGICAL SUPPLY — 4 items
CABLE SURGICAL S-101-97-12 (CABLE) ×2 IMPLANT
PAD DEFIB LIFELINK (PAD) ×2 IMPLANT
PPM ASSURITY DR PM2240 (Pacemaker) ×2 IMPLANT
TRAY PACEMAKER INSERTION (PACKS) ×2 IMPLANT

## 2015-09-03 NOTE — Discharge Instructions (Signed)
Keep incision clean and dry for 10 days. No driving for 2 days.  You can remove outer dressing tomorrow. Leave steri-strips (little pieces of tape) on until seen in the office for wound check appointment. Call the office [phone]) for redness, drainage, swelling, or fever.    Cambio de batera del Herbalistmarcapasos, cuidados posteriores (Pacemaker Battery Change, Care After) Siga estas instrucciones durante las prximas semanas. Estas indicaciones le proporcionan informacin general acerca de cmo deber cuidarse despus del procedimiento. El mdico tambin podr darle instrucciones ms especficas. El tratamiento ha sido planificado segn las prcticas mdicas actuales, pero en algunos casos pueden ocurrir problemas. Comunquese con el mdico si tiene algn problema o tiene dudas despus del procedimiento. QU ESPERAR DESPUS DEL PROCEDIMIENTO Despus del procedimiento, es normal tener:  Art therapistDolor en el lugar del marcapasos. INSTRUCCIONES PARA EL CUIDADO EN EL HOGAR   Mantenga la incisin limpia y [place].  A menos que se le diga otra cosa, podr ducharse a partir de las 48 horas despus del procedimiento.  Durante la primera semana despus del reemplazo, evite movimientos de elongacin que provoquen un Engineer, drillingestiramiento en el lugar de la incisin y evite los ejercicios fuertes con el brazo que est del lado de la incisin.  Tome los medicamentos solamente como se lo haya indicado el mdico.  Concurra a todas las visitas de control como se lo haya indicado el mdico. SOLICITE ATENCIN MDICA SI:   Advertising account executiveiente dolor en el lugar de la incisin que no puede Paramedicaliviar con medicamentos recetados o de Sales promotion account executiveventa libre.  Observa pus o secrecin en el lugar de la incisin.  El tamao de la hinchazn en el lugar de la incisin, es ms grande que una lima.  Observa una lnea roja que se extiende por arriba o por debajo del lugar de la incisin.  Tiene palpitaciones breves e intermitentes, sensacin de desvanecimiento  o cualquier sntoma que crea que puede estar relacionado con el corazn. SOLICITE ATENCIN MDICA DE INMEDIATO SI:   Siente un dolor en el pecho que es diferente del dolor que siente en el lugar del [place].  Le falta el aire.  Tiene palpitaciones o latidos cardacos irregulares.  Tiene una sensacin de desvanecimiento que no desaparece rpidamente.  Se desmaya.  Siente que el dolor empeora y no se alivia con los medicamentos.   Esta informacin no tiene Theme park managercomo fin reemplazar el consejo del mdico. Asegrese de hacerle al mdico cualquier pregunta que tenga.   Document Released: 03/15/2013 Document Revised: 06/15/2014 Elsevier Interactive Patient Education [employer]2016 Elsevier Inc.     Pacemaker Battery Change, Care After Refer to this sheet in the next few weeks. These instructions provide you with information on caring for yourself after your procedure. Your health care provider may also give you more specific instructions. Your treatment has been planned according to current medical practices, but problems sometimes occur. Call your health care provider if you have any problems or questions after your procedure. WHAT TO EXPECT AFTER THE PROCEDURE After your procedure, it is typical to have the following sensations:  Soreness at the pacemaker site. HOME CARE INSTRUCTIONS   Keep the incision clean and dry.  Unless advised otherwise, you may shower beginning 48 hours after your procedure.  For the first week after the replacement, avoid stretching motions that pull at the incision site, and avoid heavy exercise with the arm that is on the same side as the incision.  Take medicines only as directed by your health care provider.  Keep all follow-up visits  as directed by your health care provider. SEEK MEDICAL CARE IF:   You have pain at the incision site that is not relieved by over-the-counter or prescription medicine.  There is drainage or pus from the incision site.  There  is swelling larger than a lime at the incision site.  You develop red streaking that extends above or below the incision site.  You feel brief, intermittent palpitations, light-headedness, or any symptoms that you feel might be related to your heart. SEEK IMMEDIATE MEDICAL CARE IF:   You experience chest pain that is different than the pain at the pacemaker site.  You experience shortness of breath.  You have palpitations or irregular heartbeat.  You have light-headedness that does not go away quickly.  You faint.  You have pain that gets worse and is not relieved by medicine.   This information is not intended to replace advice given to you by your health care provider. Make sure you discuss any questions you have with your health care provider.   Document Released: 03/15/2013 Document Revised: 06/15/2014 Document Reviewed: 03/15/2013 Elsevier Interactive Patient Education [employer]

## 2015-09-03 NOTE — H&P (Signed)
History of Present Illness  Allen [name] is a 82 y.o. male with prior syncope with documented second degree AV block s/p PPM implant 2010 who presents today for pacemaker generator change. He is doing well at this time for his age.  He denies chest pain or shortness of breath. He denies palpitations, dizziness, near syncope or syncope. He denies LE swelling, orthopnea, PND or recent weight gain. + scrotal itching for a year.  He is compliant with his medications.  Past Medical History Past Medical History  Diagnosis Date  . Pacemaker   . Hypertension   . GERD (gastroesophageal reflux disease)   . H/O hiatal hernia   . Arthritis     hands and feet  . Anemia   . Memory loss   . TSH elevation   . Risk for falls   . Prostatic hypertrophy, benign, with obstruction   . Hyperlipidemia   . Pacemaker 2010  . Syncope   . Dizziness   . Hearing loss   . Restless legs syndrome (RLS)   . Lumbar radiculopathy, chronic   . Complicated UTI (urinary tract infection)     Past Surgical History Past Surgical History  Procedure Laterality Date  . Testicle surgery  1933  . Inguinal hernia repair  1980  . Pacemaker placement  03/2009    syncope - bradycardia, and 2nd degree block   . Hernia repair    . Esophagogastroduodenoscopy  01/29/2012    Procedure: ESOPHAGOGASTRODUODENOSCOPY (EGD); Surgeon: Barrie Folk, MD; Location: [facility] ENDOSCOPY; Service: Endoscopy; Laterality: N/A; Intrepreter scheduled 930-12.    Allergies/Intolerances Allergies  Allergen Reactions  . Flomax [Tamsulosin Hcl] Other (See Comments)    Pt becomes dizzy and passes out.    Current Home Medications Current Outpatient Prescriptions  Medication Sig Dispense Refill  . aspirin EC 81 MG tablet Take 81 mg by mouth daily.     Marland Kitchen CALCIUM-VITAMIN D PO Take 1 capsule by mouth daily.    Marland Kitchen  donepezil (ARICEPT) 5 MG tablet Tome 1 tableta por la boca al acostarse 90 tablet 0  . finasteride (PROSCAR) 5 MG tablet Tome 1 tableta por la boca diario 90 tablet 0  . Fish Oil OIL Take 1 capsule by mouth 3 (three) times daily.     Marland Kitchen gabapentin (NEURONTIN) 100 MG capsule Tome 1 capsula por la boca 3 veces al dia 90 capsule 3  . omeprazole (PRILOSEC) 20 MG capsule Tome 1 capsula por la boca dos veces al dia 180 capsule 0  . pravastatin (PRAVACHOL) 10 MG tablet Tome 1 tableta por la boca diario 90 tablet 1  . RAPAFLO 8 MG CAPS capsule Tome 1 capsula por la boca diario con el desayuno 90 capsule 0  . Olopatadine HCl (PATADAY) 0.2 % SOLN Use 1 drop in each eye daily. 2.5 mL 2   No current facility-administered medications for this visit.    Social History Social History  . Marital Status: Married   Social History Main Topics  . Smoking status: Former Games developer  . Smokeless tobacco: Not on file  . Alcohol Use: No  . Drug Use: No   Social History Narrative   Born in [place], lived in [place] until 2005   Retired farmer   Catholic faith   Sometimes stays with daughter in [place].    3 children in [place], lives with wife and son.   daugher, Jacky Kindle, is main caregiver.    Has been in [place] for about 1 year. [place] in [place]. Smoked when he was younger for about 20 years (6-7 cigarettes/day).   Review of Systems All systems reviewed and are otherwise negative except as noted above.  Physical Exam Filed Vitals:   09/03/15 0856  BP: 176/77  Pulse: 59  Temp: 98.5 F (36.9 C)  Resp: 16   General: Well developed, well appearing 82 y.o. male in no acute distress. He appears younger than his stated age. HEENT: Normocephalic, atraumatic. EOMs intact. Sclera nonicteric. Oropharynx clear. Neck: Supple. No JVD. Lungs: Respirations regular and unlabored, CTA  bilaterally. No wheezes, rales or rhonchi. Heart: RRR. S1, S2 present. No murmurs, rub, S3 or S4. Abdomen: Soft, non-distended. Extremities: No clubbing, cyanosis or edema. PT/Radials 2+ and equal bilaterally. Psych: Normal affect. Neuro: Alert and oriented X 3. Moves all extremities spontaneously.     Assessment and Plan 1. Symptomatic bradycardia with second-degree AV block s/p PPM implant Risks, benefits, and alternatives to pacemaker pulse generator replacement were discussed in detail today.  The patient understands that risks include but are not limited to bleeding, infection, pneumothorax, perforation, tamponade, vascular damage, renal failure, MI, stroke, death,  damage to his existing leads, and lead dislodgement and wishes to proceed.  A translator was used as provided by the hospital for this discussion today.  [name] MD, [facility] 09/03/2015 10:33 AM

## 2015-09-03 NOTE — Progress Notes (Signed)
Interpreter Wyvonnia DuskyGraciela Namihira for short stay /procedure  pre -cath lab

## 2015-09-12 ENCOUNTER — Encounter: Payer: Self-pay | Admitting: Internal Medicine

## 2015-09-12 ENCOUNTER — Ambulatory Visit (INDEPENDENT_AMBULATORY_CARE_PROVIDER_SITE_OTHER): Payer: Medicare Other | Admitting: *Deleted

## 2015-09-12 DIAGNOSIS — Z950 Presence of cardiac pacemaker: Secondary | ICD-10-CM

## 2015-09-12 LAB — CUP PACEART INCLINIC DEVICE CHECK
Battery Remaining Longevity: 128.4
Date Time Interrogation Session: 20170406151829
Implantable Lead Implant Date: 20101018
Implantable Lead Model: 5076
Implantable Lead Serial Number: 28583747
Lead Channel Pacing Threshold Amplitude: 0.5 V
Lead Channel Pacing Threshold Pulse Width: 0.4 ms
Lead Channel Setting Pacing Amplitude: 0.75 V
Lead Channel Setting Pacing Amplitude: 1.875
Lead Channel Setting Pacing Pulse Width: 0.4 ms
Lead Channel Setting Sensing Sensitivity: 1.5 mV
MDC IDC LEAD IMPLANT DT: 20101018
MDC IDC LEAD LOCATION: 753859
MDC IDC LEAD LOCATION: 753860
MDC IDC LEAD MODEL: 246367
MDC IDC MSMT BATTERY VOLTAGE: 3.17 V
MDC IDC MSMT LEADCHNL RA IMPEDANCE VALUE: 475 Ohm
MDC IDC MSMT LEADCHNL RA PACING THRESHOLD AMPLITUDE: 0.875 V
MDC IDC MSMT LEADCHNL RA PACING THRESHOLD PULSEWIDTH: 0.4 ms
MDC IDC MSMT LEADCHNL RA SENSING INTR AMPL: 3.5 mV
MDC IDC MSMT LEADCHNL RV IMPEDANCE VALUE: 725 Ohm
MDC IDC MSMT LEADCHNL RV SENSING INTR AMPL: 4.1 mV
MDC IDC PG SERIAL: 7893011
MDC IDC STAT BRADY RA PERCENT PACED: 83 %
MDC IDC STAT BRADY RV PERCENT PACED: 99.94 %
Pulse Gen Model: 2240

## 2015-09-12 NOTE — Progress Notes (Signed)
Wound check appointment s/p gen change. Steri-strips removed. Wound without redness or edema. Incision edges approximated, wound well healed. Normal device function. Thresholds, sensing, and impedances consistent with implant measurements. Histogram distribution appropriate for patient and level of activity. No mode switches or high ventricular rates noted. Patient educated about wound care. ROV with JA on 6/28

## 2015-09-18 ENCOUNTER — Telehealth: Payer: Self-pay | Admitting: Family Medicine

## 2015-09-23 ENCOUNTER — Other Ambulatory Visit: Payer: Self-pay | Admitting: *Deleted

## 2015-09-23 MED ORDER — PRAVASTATIN SODIUM 10 MG PO TABS: ORAL_TABLET | ORAL | Status: DC

## 2015-09-24 NOTE — Telephone Encounter (Signed)
Prescription sent to mail order [facility] [address]

## 2015-09-25 MED ORDER — ASPIRIN EC 81 MG PO TBEC: 81.0000 mg | DELAYED_RELEASE_TABLET | Freq: Every day | ORAL | Status: DC

## 2015-09-25 MED ORDER — SILODOSIN 8 MG PO CAPS: ORAL_CAPSULE | ORAL | Status: DC

## 2015-09-25 MED ORDER — FINASTERIDE 5 MG PO TABS: ORAL_TABLET | ORAL | Status: DC

## 2015-09-25 MED ORDER — OMEPRAZOLE 20 MG PO CPDR: DELAYED_RELEASE_CAPSULE | ORAL | Status: DC

## 2015-09-25 MED ORDER — DONEPEZIL HCL 5 MG PO TABS: ORAL_TABLET | ORAL | Status: DC

## 2015-09-25 MED ORDER — GABAPENTIN 100 MG PO CAPS: ORAL_CAPSULE | ORAL | Status: DC

## 2015-09-25 NOTE — Telephone Encounter (Signed)
[facility] [address]

## 2015-10-07 DIAGNOSIS — N401 Enlarged prostate with lower urinary tract symptoms: Secondary | ICD-10-CM | POA: Diagnosis not present

## 2015-10-07 DIAGNOSIS — N281 Cyst of kidney, acquired: Secondary | ICD-10-CM | POA: Diagnosis not present

## 2015-10-07 DIAGNOSIS — R972 Elevated prostate specific antigen [PSA]: Secondary | ICD-10-CM | POA: Diagnosis not present

## 2015-10-07 DIAGNOSIS — N138 Other obstructive and reflux uropathy: Secondary | ICD-10-CM | POA: Diagnosis not present

## 2015-10-07 DIAGNOSIS — R3912 Poor urinary stream: Secondary | ICD-10-CM | POA: Diagnosis not present

## 2015-10-25 ENCOUNTER — Other Ambulatory Visit: Payer: Self-pay | Admitting: Family Medicine

## 2015-10-25 MED ORDER — PRAVASTATIN SODIUM 10 MG PO TABS: ORAL_TABLET | ORAL | Status: DC

## 2015-10-25 NOTE — Telephone Encounter (Signed)
Refill request for pravastatin

## 2015-11-08 ENCOUNTER — Ambulatory Visit: Payer: Medicare Other | Admitting: Family Medicine

## 2015-12-04 ENCOUNTER — Encounter: Payer: Medicare Other | Admitting: Internal Medicine

## 2016-02-03 ENCOUNTER — Ambulatory Visit (INDEPENDENT_AMBULATORY_CARE_PROVIDER_SITE_OTHER): Payer: Medicare Other | Admitting: Internal Medicine

## 2016-02-03 ENCOUNTER — Encounter: Payer: Self-pay | Admitting: Internal Medicine

## 2016-02-03 VITALS — BP 144/86 | HR 64 | Ht 63.0 in | Wt 127.6 lb

## 2016-02-03 DIAGNOSIS — I10 Essential (primary) hypertension: Secondary | ICD-10-CM | POA: Diagnosis not present

## 2016-02-03 DIAGNOSIS — Z950 Presence of cardiac pacemaker: Secondary | ICD-10-CM | POA: Diagnosis not present

## 2016-02-03 DIAGNOSIS — I495 Sick sinus syndrome: Secondary | ICD-10-CM

## 2016-02-03 NOTE — Progress Notes (Signed)
ELECTROPHYSIOLOGY OFFICE NOTE  Patient ID: Allen [name] MRN: [id number], DOB/AGE: 1919/03/06   Date of Visit: 02/03/2016  Primary Physician: Paula ComptonJames Breen, MD Primary Cardiologist: Hillis RangeJames Elecia Serafin, MD Reason for Visit: EP/device follow-up  History of Present Illness  Allen [name] is a 82 y.o. male with prior syncope with documented second degree AV block s/p PPM implant 2010 who presents today for routine electrophysiology followup.  He is s/p recent generator change.  His daughter reports that he has had progressive decline in cognition.  He is frequently confused and appetite is poor.  Denies any pain.  Legs are weak and he is unsteady.  He denies chest pain or shortness of breath. He denies palpitations, dizziness, near syncope or syncope. He denies LE swelling, orthopnea, PND or recent weight gain. He is compliant with his medications.  Past Medical History Past Medical History:  Diagnosis Date  . Anemia   . Arthritis    hands and feet  . Complicated UTI (urinary tract infection)   . Dizziness   . GERD (gastroesophageal reflux disease)   . H/O hiatal hernia   . Hearing loss   . Hyperlipidemia   . Hypertension   . Lumbar radiculopathy, chronic   . Memory loss   . Pacemaker   . Pacemaker 2010  . Prostatic hypertrophy, benign, with obstruction   . Restless legs syndrome (RLS)   . Risk for falls   . Syncope   . TSH elevation     Past Surgical History Past Surgical History:  Procedure Laterality Date  . EP IMPLANTABLE DEVICE N/A 09/03/2015   Procedure:  PPM Generator Changeout;  Surgeon: Hillis RangeJames Nixon Sparr, MD;  [facility];  Service: Cardiovascular;  Laterality: N/A;  . ESOPHAGOGASTRODUODENOSCOPY  01/29/2012   Procedure: ESOPHAGOGASTRODUODENOSCOPY (EGD);  Surgeon: Barrie FolkJohn C Hayes, MD;  [facility];  Service: Endoscopy;  Laterality: N/A;  Intrepreter scheduled 930-12.  Marland Kitchen. HERNIA REPAIR    . INGUINAL HERNIA REPAIR  1980  .  PACEMAKER PLACEMENT  03/2009   syncope - bradycardia, and 2nd degree block   . TESTICLE SURGERY  1933    Allergies/Intolerances Allergies  Allergen Reactions  . Flomax [Tamsulosin Hcl] Other (See Comments)    Pt becomes dizzy and passes out.    Current Home Medications Current Outpatient Prescriptions  Medication Sig Dispense Refill  . aspirin EC 81 MG tablet Take 1 tablet (81 mg total) by mouth daily. 30 tablet 1  . CALCIUM-VITAMIN D PO Take 1 capsule by mouth daily.    Marland Kitchen. donepezil (ARICEPT) 5 MG tablet as directed. Tome 1 tableta por la boca  al [employer] H[employer], Incacostarse    . finasteride (PROSCAR) 5 MG tablet as directed. Tome 1 tableta por la boca  diario    . Fish Oil OIL Take 1 capsule by mouth 3 (three) times daily.     . Multiple Vitamins-Minerals (PRESERVISION/LUTEIN PO) Take 1 tablet by mouth daily.    Marland Kitchen. omeprazole (PRILOSEC) 20 MG capsule as directed. Tome 1 capsula por la boca  dos veces al dia    . pravastatin (PRAVACHOL) 10 MG tablet Take 10 mg by mouth daily. Tome 1 tableta por la boca  diario    . silodosin (RAPAFLO) 8 MG CAPS capsule as directed. Tome 1 capsula por la boca  diario con el desayuno     No current facility-administered medications for this visit.     Social History Social History  . Marital Status: Married   Social History Main Topics  .  Smoking status: Former Games developer  . Smokeless tobacco: Not on file  . Alcohol Use: No  . Drug Use: No   Social History Narrative   Born in [place], lived in [place] until 2005   Retired farmer   Catholic faith   Sometimes stays with daughter in [place].    3 children in [place], lives with wife and son.   daugher, Jacky Kindle, is main caregiver.    Has been in [place] for about 1 year. Wife lives here in [place]. Smoked when he was younger for about 20 years (6-7 cigarettes/day).    Review of Systems All systems reviewed and are otherwise negative except as noted above.  Physical Exam Vitals: Blood  pressure (!) 144/86, pulse 64, height 5\' 3"  (1.6 m), weight 127 lb 9.6 oz (57.9 kg).  General: elderly 82 y.o. male in no acute distress.  HEENT: Normocephalic, atraumatic. EOMs intact. Sclera nonicteric. Oropharynx clear.  Neck: Supple. No JVD. Lungs: Respirations regular and unlabored, CTA bilaterally. No wheezes, rales or rhonchi. Heart: RRR. S1, S2 present. No murmurs, rub, S3 or S4. Abdomen: Soft, non-distended. Extremities: No clubbing, cyanosis or edema. PT/Radials 2+ and equal bilaterally. Psych: Normal affect. Neuro: Alert and oriented X 3. Moves all extremities spontaneously.  Device interrogation today - Normal device function.    Assessment and Plan 1. Symptomatic bradycardia with second-degree AV block s/p PPM implant - Normal device function - no changes today - see paceArt  2. Hypertension Stable No change required today  3. Unsteadiness He has had progressive decline which is likely appropriate for his age. I will stop Neurontin which may be contributing. Follow-up with PCP as scheduled.  Merlin Return to see EP NP in 1 year  Hillis Range MD

## 2016-02-03 NOTE — Patient Instructions (Signed)
Medication Instructions:  Your physician has recommended you make the following change in your medication:  1) Stop Gabapentin    Labwork: None ordered   Testing/Procedures: None ordered   Follow-Up: Your physician wants you to follow-up in: 12 months with Dr Jacquiline DoeAllred You will receive a reminder letter in the mail two months in advance. If you don't receive a letter, please call our office to schedule the follow-up appointment.  Remote monitoring is used to monitor your Pacemaker  from home. This monitoring reduces the number of office visits required to check your device to one time per year. It allows us to keep an eye on the functioning of your device to ensure it is working properly. You are scheduled for a device check from home on 05/04/16. You may send your transmission at any time that day. If you have a wireless device, the transmission will be sent automatically. After your physician reviews your transmission, you will receive a postcard with your next transmission date.

## 2016-02-04 LAB — CUP PACEART INCLINIC DEVICE CHECK
Battery Remaining Longevity: 123.6
Battery Voltage: 3.02 V
Brady Statistic RA Percent Paced: 85 %
Implantable Lead Implant Date: 20101018
Implantable Lead Location: 753860
Implantable Lead Model: 246367
Implantable Lead Serial Number: 28583747
Lead Channel Impedance Value: 475 Ohm
Lead Channel Pacing Threshold Amplitude: 0.5 V
Lead Channel Pacing Threshold Amplitude: 0.75 V
Lead Channel Pacing Threshold Amplitude: 0.75 V
Lead Channel Pacing Threshold Pulse Width: 0.4 ms
Lead Channel Pacing Threshold Pulse Width: 0.4 ms
Lead Channel Pacing Threshold Pulse Width: 0.4 ms
Lead Channel Sensing Intrinsic Amplitude: 4.4 mV
Lead Channel Setting Pacing Amplitude: 1.875
Lead Channel Setting Pacing Pulse Width: 0.4 ms
Lead Channel Setting Sensing Sensitivity: 1.5 mV
MDC IDC LEAD IMPLANT DT: 20101018
MDC IDC LEAD LOCATION: 753859
MDC IDC MSMT LEADCHNL RV IMPEDANCE VALUE: 712.5 Ohm
MDC IDC MSMT LEADCHNL RV PACING THRESHOLD AMPLITUDE: 0.5 V
MDC IDC MSMT LEADCHNL RV PACING THRESHOLD PULSEWIDTH: 0.4 ms
MDC IDC MSMT LEADCHNL RV SENSING INTR AMPL: 4.1 mV
MDC IDC SESS DTM: 20170828202410
MDC IDC SET LEADCHNL RV PACING AMPLITUDE: 0.625
MDC IDC STAT BRADY RV PERCENT PACED: 99.84 %
Pulse Gen Serial Number: 7893011

## 2016-02-11 ENCOUNTER — Ambulatory Visit (INDEPENDENT_AMBULATORY_CARE_PROVIDER_SITE_OTHER): Payer: Medicare Other | Admitting: Family Medicine

## 2016-02-11 ENCOUNTER — Encounter: Payer: Self-pay | Admitting: Family Medicine

## 2016-02-11 DIAGNOSIS — Z23 Encounter for immunization: Secondary | ICD-10-CM

## 2016-02-11 DIAGNOSIS — R413 Other amnesia: Secondary | ICD-10-CM

## 2016-02-11 DIAGNOSIS — I10 Essential (primary) hypertension: Secondary | ICD-10-CM | POA: Diagnosis not present

## 2016-02-11 MED ORDER — TETANUS-DIPHTH-ACELL PERTUSSIS 5-2.5-18.5 LF-MCG/0.5 IM SUSP: 0.5000 mL | Freq: Once | INTRAMUSCULAR | 0 refills | Status: AC

## 2016-02-11 MED ORDER — ZOSTER VACCINE LIVE 19400 UNT/0.65ML ~~LOC~~ SUSR: 0.6500 mL | Freq: Once | SUBCUTANEOUS | 0 refills | Status: AC

## 2016-02-11 NOTE — Patient Instructions (Addendum)
It was nice to meet you today!  Please schedule an appointment in geriatrics clinic with Dr. McDiarmid. Have both Thyra Breednrique and Raquel come on the same day.  Continue current medications.  Follow up with me in 3 months.  For health maintenance: - get tetanus shot from the pharmacy (see prescription) - get shingles shot from the pharmacy (see prescription)  Be well, Dr. Pollie MeyerMcIntyre

## 2016-02-11 NOTE — Progress Notes (Signed)
Date of Visit: 02/11/2016   HPI:  Patient presents for follow up. He is accompanied by his wife, who is also being seen. Also accompanied by two daughters, who help provide much of the history. Attempted to use video spanish interpreter, but patient and wife are very hard of hearing, and it became necessary to use daughters to interpret.  This is my first time meeting patient and his wife, as they recently were assigned to me as PCP. Both patient and wife live with one of their daughters, and require some assistance with ADL's. Patient is able to feed himself, dress himself, and bathe himself. Does not cook, his meals are prepared for him.  Daughters bring up concern over his memory. Has been forgetful. Recently saw his cardiologist who recommended stopping gabapentin 100mg  in case it was contributing to memory issues. Previously took gabapentin for pains in legs. Has not had any pains since stopping gabapentin.  Hypertension - not on any strict antihypertensives, but currently taking silodosin and finasteride for BPH. Denies chest pain, shortness of breath, leg swelling.  ROS: See HPI.  PMFSH: history of hypertension, second degree mobitz II, BPH, pacemaker, hyperlipidemia, GERD, RLS  PHYSICAL EXAM: BP (!) 148/72   Pulse 62   Temp 97.8 F (36.6 C) (Oral)   Ht 5\' 3"  (1.6 m)   Wt 127 lb (57.6 kg)   BMI 22.50 kg/m  Gen: NAD, pleasant, cooperative HEENT: normocephalic, atraumatic, moist mucous membranes  Heart: regular rate and rhythm, no murmur Lungs: clear to auscultation bilaterally, normal work of breathing  Neuro: alert grossly nonfocal, speech normal Ext: No appreciable lower extremity edema bilaterally   ASSESSMENT/PLAN:  Health maintenance:  -given rx for Tdap and zostavax today -flu vaccine given today in office.  HYPERTENSION, BENIGN At goal on BPH medications finasteride & silodosin. No changes to medications presently.  Memory loss Daughters bringing up new concerns  for worsened memory loss. Reviewed prior records, in 2015 had CT head and laboratory workup for reversible causes of dementia. Presently on aricept 5mg  daily. Would like to do MOCA to assess present cognitive capacity, but time limited our ability to do this today. Patient & wife will schedule in geriatrics clinic with Dr. McDiarmid for this.  FOLLOW UP: Follow up in 3 months with me for routine medical problems Schedule in geriatrics clinic.  [place]adaBrittany J. Pollie MeyerMcIntyre, MD [facility]

## 2016-02-13 NOTE — Assessment & Plan Note (Signed)
At goal on BPH medications finasteride & silodosin. No changes to medications presently.

## 2016-02-13 NOTE — Assessment & Plan Note (Signed)
Daughters bringing up new concerns for worsened memory loss. Reviewed prior records, in 2015 had CT head and laboratory workup for reversible causes of dementia. Presently on aricept 5mg  daily. Would like to do MOCA to assess present cognitive capacity, but time limited our ability to do this today. Patient & wife will schedule in geriatrics clinic with Dr. McDiarmid for this.

## 2016-02-19 ENCOUNTER — Telehealth: Payer: Self-pay | Admitting: Family Medicine

## 2016-02-19 NOTE — Telephone Encounter (Signed)
He can do a senior multivitamin - these can be purchased over the counter  Thanks Latrelle DodrillBrittany J McIntyre, MD

## 2016-02-19 NOTE — Telephone Encounter (Signed)
pts daugher: father does not to want to eat.  Could she recommend vitamins?  He drinks an ensure in the morning and one at night.  This is the only thing he eats/drinks.  Please advise

## 2016-02-20 NOTE — Telephone Encounter (Signed)
Pt daughter informed. Deseree Blount, CMA  

## 2016-03-19 ENCOUNTER — Ambulatory Visit (INDEPENDENT_AMBULATORY_CARE_PROVIDER_SITE_OTHER): Payer: Medicare Other | Admitting: Family Medicine

## 2016-03-19 VITALS — BP 126/64 | HR 60 | Temp 97.5°F | Ht 63.0 in | Wt 122.0 lb

## 2016-03-19 DIAGNOSIS — R413 Other amnesia: Secondary | ICD-10-CM | POA: Diagnosis not present

## 2016-03-19 DIAGNOSIS — F0280 Dementia in other diseases classified elsewhere without behavioral disturbance: Secondary | ICD-10-CM

## 2016-03-19 DIAGNOSIS — G301 Alzheimer's disease with late onset: Secondary | ICD-10-CM

## 2016-03-19 DIAGNOSIS — R946 Abnormal results of thyroid function studies: Secondary | ICD-10-CM | POA: Diagnosis not present

## 2016-03-19 DIAGNOSIS — R7989 Other specified abnormal findings of blood chemistry: Secondary | ICD-10-CM

## 2016-03-19 LAB — COMPLETE METABOLIC PANEL WITH GFR
ALK PHOS: 70 U/L (ref 40–115)
ALT: 29 U/L (ref 9–46)
AST: 31 U/L (ref 10–35)
Albumin: 3.9 g/dL (ref 3.6–5.1)
BILIRUBIN TOTAL: 0.5 mg/dL (ref 0.2–1.2)
BUN: 12 mg/dL (ref 7–25)
CALCIUM: 9.5 mg/dL (ref 8.6–10.3)
CO2: 25 mmol/L (ref 20–31)
CREATININE: 1.02 mg/dL (ref 0.70–1.11)
Chloride: 99 mmol/L (ref 98–110)
GFR, EST AFRICAN AMERICAN: 72 mL/min (ref >=60)
GFR, Est Non African American: 62 mL/min (ref >=60)
Glucose, Bld: 99 mg/dL (ref 65–99)
Potassium: 4.5 mmol/L (ref 3.5–5.3)
Sodium: 132 mmol/L — ABNORMAL LOW (ref 135–146)
TOTAL PROTEIN: 6.6 g/dL (ref 6.1–8.1)

## 2016-03-19 LAB — VITAMIN B12

## 2016-03-19 LAB — TSH: TSH: 4.81 m[IU]/L — AB (ref 0.40–4.50)

## 2016-03-19 NOTE — Patient Instructions (Signed)
We will check blood work and a head CT.  Please come back in 2 weeks for the geriatrics clinic so that we can complete our assessment.  Take care,  Dr Jimmey RalphParker

## 2016-03-20 LAB — RPR

## 2016-03-24 ENCOUNTER — Ambulatory Visit (HOSPITAL_COMMUNITY)
Admission: RE | Admit: 2016-03-24 | Discharge: 2016-03-24 | Disposition: A | Discharge status: Home / Self Care | Payer: Medicare Other | Source: Ambulatory Visit | Attending: Family Medicine | Admitting: Family Medicine

## 2016-03-24 DIAGNOSIS — G319 Degenerative disease of nervous system, unspecified: Secondary | ICD-10-CM | POA: Insufficient documentation

## 2016-03-24 DIAGNOSIS — R413 Other amnesia: Secondary | ICD-10-CM

## 2016-03-27 ENCOUNTER — Encounter: Payer: Self-pay | Admitting: Family Medicine

## 2016-03-27 DIAGNOSIS — R7989 Other specified abnormal findings of blood chemistry: Secondary | ICD-10-CM | POA: Insufficient documentation

## 2016-03-27 NOTE — Progress Notes (Signed)
[facility]:   Patient is accompanied by: son, daughter and spouse Primary caregiver: son and daughter Patient's lives with their family.(his daughter and his wife) Patient information was obtained from patient, relative(s) and past medical records. History/Exam limitations: dementia. Primary Care Provider: Levert Feinstein, MD Referring provider: Levert Feinstein, MD Reason for referral:  Chief Complaint  Patient presents with  . Memory Loss   History Chief Complaint  Patient presents with  . Memory Loss    HPI by problems: Cognitive impairment concern What problems with thinking are there? memory loss and getting lost within home  When were the changes first noticed? most notiably after he and wife returned from [place] recently ago  Did this change occur abruptly or gradually? gradual  How have the changes progressed since then? gradually worsening  Does their level of alertness change throughout the day? no  Is their speech disorganized, rambling? no  Has there been any tremors or abnormal movements? no  Have they had in hallucinations or delusions: yes, seeing deceased relatives that is not frightening and believing items of his have been stolen  Have they appeared more anxious or sad lately? no  Do they still have interests or activities they enjor doing? no, mostly watching TV ([employer]) and reading though the children think that he is unable to follow a narrative  How has their appetite been lately? show no change  How has their sleep been lately? Some awakening at night but remains in room with his wife.   Problem behaviors: other than sometimes saying that his personal items have been stolen when he can't find them, he has not had significant behavioral or psychological issues   Compared to 5 to 10 years ago, how is the patient at:  Remembering things about family and friends e.g. names, occupations, birthdays, addresses? are  worsening  Remembering things that have happened recently? are worsening Recalling conversations a few days later? are worsening  Remembering what day and month it is? He is unable to recall dates and years  Remembering where things are usually kept? are worsening  Losing things? are worsening  Learning to use a new gadget or machine around the house? He has not had to try to learn any new devices.  Children make outgoing calls for him.   Learning new things in general? are worsening  Following a story in a book or on TV? are worsening  Handling money for shopping? He does not handle cash.   Handling financial matters, e.g. their pension, dealing with the bank? He has not hanlded his own fianances for several years.  His son manages his money.   Able to cope with unexpected events? show no change  Getting lost? He does get lost within the home.  He does not attempt to go outside alone.   Asking same questions repeatedly or telling the same story repeatedly to the same person(s)? yes      Outpatient Encounter Prescriptions as of 03/19/2016  Medication Sig  . b complex vitamins capsule Take 1 capsule by mouth daily.  Marland Kitchen aspirin EC 81 MG tablet Take 1 tablet (81 mg total) by mouth daily.  Marland Kitchen CALCIUM-VITAMIN D PO Take 1 capsule by mouth daily.  Marland Kitchen donepezil (ARICEPT) 5 MG tablet as directed. Tome 1 tableta por la boca  al [employer]  . finasteride (PROSCAR) 5 MG tablet as directed. Tome 1 tableta por la boca  diario  . Fish Oil OIL Take 1 capsule by mouth 3 (  three) times daily.   . Multiple Vitamins-Minerals (PRESERVISION/LUTEIN PO) Take 1 tablet by mouth daily.  Marland Kitchen omeprazole (PRILOSEC) 20 MG capsule as directed. Tome 1 capsula por la boca  dos veces al dia  . pravastatin (PRAVACHOL) 10 MG tablet Take 10 mg by mouth daily. Tome 1 tableta por la boca  diario  . silodosin (RAPAFLO) 8 MG CAPS capsule as directed. Tome 1 capsula por la boca  diario con el desayuno   No  facility-administered encounter medications on file as of 03/19/2016.    History Patient Active Problem List   Diagnosis Date Noted  . Pterygium eye 01/26/2014  . Hyponatremia 10/18/2013  . Loss of weight 04/19/2013  . Syncope 03/11/2013  . PNA (pneumonia) 03/10/2013  . Dizziness 03/10/2013  . Pacemaker 03/10/2013  . Complicated UTI (urinary tract infection) 03/09/2013  . Memory loss 12/23/2012  . Risk for falls 12/23/2012  . Urinary incontinence 12/23/2012  . Nausea & vomiting 01/18/2012  . Impacted cerumen of left ear 01/01/2012  . TSH elevation 03/26/2011  . Anemia 03/26/2011  . Lumbar radiculopathy, chronic 11/17/2010  . Second degree Mobitz II AV block 03/13/2010  . CONSTIPATION 04/17/2009  . LOSS, HEARING NOS 11/09/2006  . BENIGN PROSTATIC HYPERTROPHY, WITH OBSTRUCTION 11/09/2006  . HYPERTENSION, BENIGN 10/20/2006  . ALLERGIC RHINITIS, SEASONAL 10/20/2006  . HYPERLIPIDEMIA 08/05/2006  . RESTLESS LEGS SYNDROME 08/05/2006  . GASTROESOPHAGEAL REFLUX, NO ESOPHAGITIS 08/05/2006   Past Medical History:  Diagnosis Date  . Anemia   . Arthritis    hands and feet  . Complicated UTI (urinary tract infection)   . Dizziness   . GERD (gastroesophageal reflux disease)   . H/O hiatal hernia   . Hearing loss   . Hyperlipidemia   . Hypertension   . Lumbar radiculopathy, chronic   . Memory loss   . Pacemaker   . Pacemaker 2010  . Prostatic hypertrophy, benign, with obstruction   . Restless legs syndrome (RLS)   . Risk for falls   . Syncope   . TSH elevation    Past Surgical History:  Procedure Laterality Date  . EP IMPLANTABLE DEVICE N/A 09/03/2015   Procedure:  PPM Generator Changeout; SJM dual chamber ppm by Dr Johney Frame  . ESOPHAGOGASTRODUODENOSCOPY  01/29/2012   Procedure: ESOPHAGOGASTRODUODENOSCOPY (EGD);  Surgeon: Barrie Folk, MD;  Location: [facility] ENDOSCOPY;  Service: Endoscopy;  Laterality: N/A;  Intrepreter scheduled 930-12.  Marland Kitchen HERNIA REPAIR    . INGUINAL HERNIA REPAIR   1980  . PACEMAKER PLACEMENT  03/2009   syncope - bradycardia, and 2nd degree block   . TESTICLE SURGERY  1933   Family History  Problem Relation Age of Onset  . Heart attack Neg Hx   . Stroke Neg Hx   . Cancer Daughter    Social History   Social History  . Marital status: Married    Spouse name: Raquel  . Number of children: 10  . Years of education: 2   Occupational History  . retired Retired   Social History Main Topics  . Smoking status: Former Games developer  . Smokeless tobacco: Never Used  . Alcohol use No  . Drug use: No  . Sexual activity: Not Asked   Other Topics Concern  . None   Social History Narrative   Born in [place], lived in [place] until 2005   Retired farmer   Catholic faith   Sometimes stays with daughter in [place] state.    3 children in [place], lives with wife and son.  daugher, Jacky Kindle, is main caregiver.    Can't read Spanish   2 years of formal education - once prided himself on his ability to do math.          Has been in [place] for about 1 year. Wife lives here in [place]. Smoked when he was younger for about 20 years (6-7 cigarettes/day).     Basic Activities of Daily Living  ADLs Independent Needs Assistance Dependent  Bathing  X (safety supervision)   Dressing  X (safety supervision)   Ambulation x    Toileting x    Eating x     Instrumental Activities of Daily Living IADL Independent Needs Assistance Dependent  Cooking   x  Housework   x  Manage Medications   x  Manage the telephone   x  Shopping for food, clothes, Meds, etc   x  Use transportation   x  Manage Finances   x    Caregivers in home: son, daughter and spouse   FALLS in last five office visits:  Fall Risk  03/19/2016 02/11/2016 04/09/2015 04/27/2014 05/19/2013  Falls in the past year? No No No No Yes  Number falls in past yr: - - - - 2 or more  Risk Factor Category  - - - - High Fall Risk  Risk for fall due to : - - - - History of fall(s)   Risk for fall due to (comments): - - - - -    Health Maintenance reviewed: Immunization History  Administered Date(s) Administered  . Influenza Split 03/02/2011  . Influenza Whole 03/06/2010  . Influenza,inj,Quad PF,36+ Mos 02/17/2013, 03/26/2014, 04/03/2015, 02/11/2016  . Pneumococcal Conjugate-13 05/19/2013  . Pneumococcal Polysaccharide-23 06/09/1999  . Td 09/12/2005   Health Maintenance Topics with due status: Overdue     Topic Date Due   TETANUS/TDAP 09/13/2015    Diet: Regular Nutritional supplements: none  Geriatric Syndromes: Constipation no ,   Incontinence no  Dizziness no   Syncope no   Skin problems no   Visual Impairment yes, 20/100 bilateral without glasses   Hearing impairment Yes, hh has hearing aids  Eating impairment no  Impaired Memory or Cognition yes   Behavioral problems no   Sleep problems yes   Weight loss no    ROS Denies fevers/chills; denies changes in appetite; denies changes in weight;  Denies changes in vision / hearing / smell / taste; Denies runny nose / ear pain or discharge / sore throat / sinus congestion / cough/w phlegm; Denies chest congestion / wheezing;  Denies chest pain; denies heart beating slower/thumps inside chest; denies racing heart/flutter; Denies dysuria; denies hematuria;  Denies constipation; denies melena/hematochezia; denies diarrhea;  Denies abdominal discomfort/gaseous bloating; denies N/V; denies heart burn;  Denies recent falls/unsteady gait;  Denies unilateral weakness / clumsiness / tingling / numbness; denies tremors;  Denies sadness / anxiety / suicidal tendencies  Vital Signs Weight: 122 lb (55.3 kg) Body mass index is 21.61 kg/m. Estimated Creatinine Clearance: 33.9 mL/min (by C-G formula based on SCr of 1.02 mg/dL). Body surface area is 1.57 meters squared. Vitals:   03/19/16 1441  BP: 126/64  Pulse: 60  Temp: 97.5 F (36.4 C)  TempSrc: Oral  Weight: 122 lb (55.3 kg)  Height: 5\' 3"  (1.6 m)    Wt Readings from Last 3 Encounters:  03/19/16 122 lb (55.3 kg)  02/11/16 127 lb (57.6 kg)  02/03/16 127 lb 9.6 oz (57.9 kg)   No exam data present  Physical Examination:  VS reviewed GEN: Alert, Cooperative, Groomed, NAD   Montreal Cognitive Assessment - BLIND in Spanish:  Pt's son read script for MoCA - BLIND in Spanish Patient did  require additional cues or prompts to complete tasks. Multiple repetitions of the instructions Patient was cooperative and attentive to testing tasks Patient did  appear motivated to perform well  No flowsheet data found.      Montreal Cognitive Assessment  03/19/2016  Visuospatial/ Executive (0/5) (No Data)  Naming (0/3) (No Data)  Attention: Read list of digits (0/2) 0  Attention: Read list of letters (0/1) 1  Attention: Serial 7 subtraction starting at 100 (0/3) 0  Language: Repeat phrase (0/2) 0  Language : Fluency (0/1) 0  Abstraction (0/2) 0  Delayed Recall (0/5) 0  Orientation (0/6) 1    PHQ-2: 0/2  Labs  Lab Results  Component Value Date   VITAMINB12 >2000 (H) 03/19/2016    Lab Results  Component Value Date   FOLATE 11.6 10/17/2013    Lab Results  Component Value Date   TSH 4.81 (H) 03/19/2016    No results found for: RPR    Chemistry      Component Value Date/Time   NA 132 (L) 03/19/2016 1640   K 4.5 03/19/2016 1640   CL 99 03/19/2016 1640   CO2 25 03/19/2016 1640   BUN 12 03/19/2016 1640   CREATININE 1.02 03/19/2016 1640      Component Value Date/Time   CALCIUM 9.5 03/19/2016 1640   ALKPHOS 70 03/19/2016 1640   AST 31 03/19/2016 1640   ALT 29 03/19/2016 1640   BILITOT 0.5 03/19/2016 1640       No results found for: HGBA1C    Lab Results  Component Value Date   WBC 4.3 09/03/2015   HGB 13.5 09/03/2015   HCT 39.8 09/03/2015   MCV 93.2 09/03/2015   PLT 213 09/03/2015   RPR - Nonreactive (03/19/16)   Imaging Head CT: March 24, 2016: Interval progression of superficial and central atrophy  compared to 11/20/13 Head CT.  Moderate calcification of bilateral carotid siphons.  Chronic moderate diffuse small vessel ischemic disease   Assessment and Plan: Problem List Items Addressed This Visit      Unprioritized   Memory loss - Primary   Relevant Orders   COMPLETE METABOLIC PANEL WITH GFR (Completed)   TSH (Completed)   Vitamin B12 (Completed)   RPR (Completed)   CT Head Wo Contrast (Completed)    Other Visit Diagnoses   None.     Personal Strengths Supportive family/friends  Support System Strengths Supportive Relationships   Montreal Cognitive Assessment  03/19/2016  Visuospatial/ Executive (0/5) (No Data)  Naming (0/3) (No Data)  Attention: Read list of digits (0/2) 0  Attention: Read list of letters (0/1) 1  Attention: Serial 7 subtraction starting at 100 (0/3) 0  Language: Repeat phrase (0/2) 0  Language : Fluency (0/1) 0  Abstraction (0/2) 0  Delayed Recall (0/5) 0  Orientation (0/6) 1   Total = 3 out of 22 which corrects to 4 out of 30

## 2016-03-27 NOTE — Assessment & Plan Note (Signed)
New finding Suspect either return to normal range with regression to mean or subclinical hypothyroidism Will check FT4 next ov

## 2016-03-27 NOTE — Assessment & Plan Note (Addendum)
Diagnosis: Dementia of probable Alzheimer's type.   Evidence of cognitive domain(s) impairment on formal testing with corrected score on MoCA - BLIND in Spanish of 4 out of 30.  Impairment(s) have shown are gradual in onset progression of symptoms according to son and daughter with who patient has lived. No evidence of confounding of dementia diagnosis from mood disorder, psychosis or medications.  No reversible causes of dementia found on neuroimaging and lab testing.   FAST Stage: 6A (pt requiring supervision of bathing and dressing for safety) memory loss, slow information processing, difficulty with abstract concepts and difficulty with complex reasoning decreased problem solving Palliative Performance Score: 70 to 80%.  No prior PSS assessments. Medication or substances that may impair patient's cognition: none identified Behavioral and Psychological Symptoms of Dementia: none that are disturbing to patient nor caretakers  Pharmacologic treatments of patient: donepezil 5 mg daily started by Dr Allen [name] 12/01/13  Response: dgt reported to Dr Allen [name] 2 months after donepezil start that patient was more interactive and social at home.  Pt had PPM in place since 2010. Adverse effects from pharmacologic treatments: no weight loss nor GI upset per prior notes of Dr Allen [name].   Mr Allen [name] is to return [facility] in a few weeks to complete his Geriatric Assessement and review test results.

## 2016-05-04 ENCOUNTER — Telehealth: Payer: Self-pay | Admitting: Cardiology

## 2016-05-04 ENCOUNTER — Encounter: Payer: Medicare Other | Admitting: *Deleted

## 2016-05-04 NOTE — Telephone Encounter (Signed)
Confirmed remote transmission w/ pt daughter.   

## 2016-05-07 ENCOUNTER — Ambulatory Visit (INDEPENDENT_AMBULATORY_CARE_PROVIDER_SITE_OTHER): Payer: Medicare Other | Admitting: Family Medicine

## 2016-05-07 DIAGNOSIS — Z7409 Other reduced mobility: Secondary | ICD-10-CM | POA: Insufficient documentation

## 2016-05-07 DIAGNOSIS — N3944 Nocturnal enuresis: Secondary | ICD-10-CM

## 2016-05-07 DIAGNOSIS — R2689 Other abnormalities of gait and mobility: Secondary | ICD-10-CM | POA: Diagnosis present

## 2016-05-07 NOTE — Patient Instructions (Addendum)
We will request evaluation of Allen [name] for Personal Care Services.  You will be contacted for an evaluation for these services by a company that evaluates for personal care services.  If your father qualifies for services, they will provide you with list of companies that are authorized by [place] to provide services.  Dr Jillayne Witte has made a referral for Physical Therapists to come to your home to help Allen [name] on his balance, leg strength, and speed of walking, along with recommending walking assist devices.   Consider completing the Advanced Care documents so your wishes will be honored at the end of your life.   It is a gift to your family because it helps them make decisions without feeling guilty.

## 2016-05-08 ENCOUNTER — Encounter: Payer: Self-pay | Admitting: Cardiology

## 2016-05-08 DIAGNOSIS — N3944 Nocturnal enuresis: Secondary | ICD-10-CM | POA: Insufficient documentation

## 2016-05-08 MED ORDER — CHOLECALCIFEROL 25 MCG (1000 UT) PO TABS: 1000.0000 [IU] | ORAL_TABLET | Freq: Every day | ORAL | 99 refills | Status: DC

## 2016-05-08 NOTE — Progress Notes (Signed)
[facility]:   Patient is accompanied by: son, daughter and spouse Patient's son declined Spanish interpreter.  Primary caregiver: sister, son and spouse Patient's lives with their family. Patient information was obtained from patient, relative(s) and past medical records. History/Exam limitations: dementia. Primary Care Provider: Levert FeinsteinBrittany McIntyre, MD Referring provider: Dr Pollie MeyerMcIntyre Reason for referral:  Chief Complaint  Patient presents with  . Follow-up    geri appointment continued    History Chief Complaint  Patient presents with  . Follow-up    geri appointment continued    HPI by problems:   Mr Allen [name] is unable to get out of bed unassisted. He cannot dress the lower half of his body without assistance He does not prepare food. He does not shop.   He has nocturnal urinary incontinence.  He refuses to wear a diaper at night.   Caregivers in home: son and daughter. Mr Gutierrez's children work.  He does not have 24/7 supervision.   Caregiver Burdens: son reports they do not feel burdened by caring for their parents.  They worry about the times he is home alone with his wife who also has dementia.    FALLS in last five office visits:  Fall Risk  05/07/2016 03/19/2016 02/11/2016 04/09/2015 04/27/2014  Falls in the past year? No No No No No  Number falls in past yr: - - - - -  Risk Factor Category  - - - - -  Risk for fall due to : - - - - -  Risk for fall due to (comments): - - - - -     Vital Signs Weight: 121 lb (54.9 kg) Body mass index is 21.43 kg/m. CrCl cannot be calculated (Patient's most recent lab result is older than the maximum 21 days allowed.). Body surface area is 1.56 meters squared. Vitals:   05/07/16 1348  BP: 135/64  Pulse: 65  Temp: 97.5 F (36.4 C)  TempSrc: Oral  Weight: 121 lb (54.9 kg)   Wt Readings from Last 3 Encounters:  05/07/16 121 lb (54.9 kg)  03/19/16 122 lb (55.3 kg)  02/11/16 127 lb (57.6 kg)    No exam data present   Timed Up & Go Test: 21 seconds (abnormal) Sit-to-Stand Test: 5  / 30-seconds (low for age and sex) 4-Stage Stand Test:  Feet Side-by-side: Yes.       Feet Semi-tandem: No.         No flowsheet data found.      Montreal Cognitive Assessment  03/27/2016 03/19/2016  Visuospatial/ Executive (0/5) (No Data) (No Data)  Naming (0/3) (No Data) (No Data)  Attention: Read list of digits (0/2) 0 0  Attention: Read list of letters (0/1) 1 1  Attention: Serial 7 subtraction starting at 100 (0/3) 0 0  Language: Repeat phrase (0/2) 0 0  Language : Fluency (0/1) 0 0  Abstraction (0/2) 0 0  Delayed Recall (0/5) 0 0  Orientation (0/6) 1 1    Labs  Lab Results  Component Value Date   VITAMINB12 >2000 (H) 03/19/2016    Lab Results  Component Value Date   FOLATE 11.6 10/17/2013    Lab Results  Component Value Date   TSH 4.81 (H) 03/19/2016    No results found for: RPR    Chemistry      Component Value Date/Time   NA 132 (L) 03/19/2016 1640   K 4.5 03/19/2016 1640   CL 99 03/19/2016 1640   CO2 25 03/19/2016 1640  BUN 12 03/19/2016 1640   CREATININE 1.02 03/19/2016 1640      Component Value Date/Time   CALCIUM 9.5 03/19/2016 1640   ALKPHOS 70 03/19/2016 1640   AST 31 03/19/2016 1640   ALT 29 03/19/2016 1640   BILITOT 0.5 03/19/2016 1640       Imaging Head CT: March 24, 2016:  Interval progression of superficial and central atrophy compared to Head Ct 11/20/13.  Diffuse small vessel disease present      Assessment and Plan: Problem List Items Addressed This Visit      Unprioritized   Abnormality of gait due to impairment of balance   Relevant Orders   Ambulatory referral to Home Health      CODE Mr Gutierrez's reply to whether he would want his doctors to try "bring him back" if he died was no, he would not. He reports not sharing this preference with his children.  He did not want his son to discuss this question with his family.    30 minutes face to face where spent in total with counseling / coordination of care took more than 50% of the total time. Counseling involved discussion with patient's children about applying for personal care services for their father, nature of advanced directives and role of HC POA.  The children were open to discussion.  Ad[facility] Care and [facility] POA documents in Spanish providded to family.  Discussion on diagnosis of their fathers gait abnormality and role of multifactorial interventions to prevent falls, including home safety evaluation, wearing his glassed, using an assist device within home, start of vitamin d daily, firm soled shoes, and physical therapy.  Care was coordinated with our Ms Sammuel HinesDeborah Moore, LCSW, who also saw patient during his visit.

## 2016-05-11 ENCOUNTER — Ambulatory Visit (INDEPENDENT_AMBULATORY_CARE_PROVIDER_SITE_OTHER): Payer: Medicare Other | Admitting: *Deleted

## 2016-05-11 ENCOUNTER — Telehealth: Payer: Self-pay | Admitting: Family Medicine

## 2016-05-11 ENCOUNTER — Ambulatory Visit: Payer: Medicare Other | Admitting: Family Medicine

## 2016-05-11 DIAGNOSIS — R2681 Unsteadiness on feet: Secondary | ICD-10-CM | POA: Diagnosis not present

## 2016-05-11 DIAGNOSIS — Z87891 Personal history of nicotine dependence: Secondary | ICD-10-CM | POA: Diagnosis not present

## 2016-05-11 DIAGNOSIS — M19072 Primary osteoarthritis, left ankle and foot: Secondary | ICD-10-CM | POA: Diagnosis not present

## 2016-05-11 DIAGNOSIS — I495 Sick sinus syndrome: Secondary | ICD-10-CM

## 2016-05-11 DIAGNOSIS — M19071 Primary osteoarthritis, right ankle and foot: Secondary | ICD-10-CM | POA: Diagnosis not present

## 2016-05-11 DIAGNOSIS — M19042 Primary osteoarthritis, left hand: Secondary | ICD-10-CM | POA: Diagnosis not present

## 2016-05-11 DIAGNOSIS — M19041 Primary osteoarthritis, right hand: Secondary | ICD-10-CM | POA: Diagnosis not present

## 2016-05-11 DIAGNOSIS — M6281 Muscle weakness (generalized): Secondary | ICD-10-CM | POA: Diagnosis not present

## 2016-05-11 DIAGNOSIS — F0390 Unspecified dementia without behavioral disturbance: Secondary | ICD-10-CM | POA: Diagnosis not present

## 2016-05-11 DIAGNOSIS — Z950 Presence of cardiac pacemaker: Secondary | ICD-10-CM | POA: Diagnosis not present

## 2016-05-11 DIAGNOSIS — Z7982 Long term (current) use of aspirin: Secondary | ICD-10-CM | POA: Diagnosis not present

## 2016-05-11 DIAGNOSIS — M5416 Radiculopathy, lumbar region: Secondary | ICD-10-CM | POA: Diagnosis not present

## 2016-05-11 DIAGNOSIS — I10 Essential (primary) hypertension: Secondary | ICD-10-CM | POA: Diagnosis not present

## 2016-05-11 NOTE — Telephone Encounter (Signed)
[facility]: verbal orders for PT for 2 times per week for 3 weeks for safe transfer, caregiver education, gait balance and training. Also needs OT for evaluation and treatment for home safety modifications.

## 2016-05-11 NOTE — Telephone Encounter (Signed)
Red team, please call and authorize these orders. Shanteria Laye J Aurelio Mccamy, MD  

## 2016-05-11 NOTE — Telephone Encounter (Signed)
Monique from [facility] informed. Deseree Blount, CMA  

## 2016-05-11 NOTE — Progress Notes (Signed)
Remote pacemaker transmission.   

## 2016-05-12 ENCOUNTER — Encounter: Payer: Self-pay | Admitting: Family Medicine

## 2016-05-12 DIAGNOSIS — H547 Unspecified visual loss: Secondary | ICD-10-CM | POA: Insufficient documentation

## 2016-05-13 DIAGNOSIS — M19041 Primary osteoarthritis, right hand: Secondary | ICD-10-CM | POA: Diagnosis not present

## 2016-05-13 DIAGNOSIS — M19071 Primary osteoarthritis, right ankle and foot: Secondary | ICD-10-CM | POA: Diagnosis not present

## 2016-05-13 DIAGNOSIS — M6281 Muscle weakness (generalized): Secondary | ICD-10-CM | POA: Diagnosis not present

## 2016-05-13 DIAGNOSIS — M5416 Radiculopathy, lumbar region: Secondary | ICD-10-CM | POA: Diagnosis not present

## 2016-05-13 DIAGNOSIS — F0390 Unspecified dementia without behavioral disturbance: Secondary | ICD-10-CM | POA: Diagnosis not present

## 2016-05-13 DIAGNOSIS — R2681 Unsteadiness on feet: Secondary | ICD-10-CM | POA: Diagnosis not present

## 2016-05-15 ENCOUNTER — Telehealth: Payer: Self-pay | Admitting: Family Medicine

## 2016-05-15 ENCOUNTER — Encounter: Payer: Self-pay | Admitting: Licensed Clinical Social Worker

## 2016-05-15 DIAGNOSIS — R2681 Unsteadiness on feet: Secondary | ICD-10-CM | POA: Diagnosis not present

## 2016-05-15 DIAGNOSIS — M6281 Muscle weakness (generalized): Secondary | ICD-10-CM | POA: Diagnosis not present

## 2016-05-15 DIAGNOSIS — M5416 Radiculopathy, lumbar region: Secondary | ICD-10-CM | POA: Diagnosis not present

## 2016-05-15 DIAGNOSIS — M19041 Primary osteoarthritis, right hand: Secondary | ICD-10-CM | POA: Diagnosis not present

## 2016-05-15 DIAGNOSIS — M19071 Primary osteoarthritis, right ankle and foot: Secondary | ICD-10-CM | POA: Diagnosis not present

## 2016-05-15 DIAGNOSIS — F0390 Unspecified dementia without behavioral disturbance: Secondary | ICD-10-CM | POA: Diagnosis not present

## 2016-05-15 NOTE — Telephone Encounter (Signed)
Please call HH agency and authorize this.  Lynnsie Linders J Tarini Carrier, MD  

## 2016-05-15 NOTE — Telephone Encounter (Signed)
Michelle informed, expressed understanding. 

## 2016-05-15 NOTE — Progress Notes (Signed)
LCSW received completed Personal Care Application from Dr. McDiarmid.  Application fax[facility] to [facility] and receipt of application confirmed.  Per Loraine LericheMark with [facility], patient's assessment is scheduled on 12/11 /17.  Sammuel Hineseborah Jerrold Haskell, LCSW Licensed Clinical Social Worker [facility]   [phone] 3:24 PM

## 2016-05-15 NOTE — Telephone Encounter (Signed)
Home health OT evaluation completed today. Would like verbal orders for OT twice a week for two weeks. Please advise. Thanks! ep

## 2016-05-19 ENCOUNTER — Ambulatory Visit (INDEPENDENT_AMBULATORY_CARE_PROVIDER_SITE_OTHER): Payer: Medicare Other | Admitting: Family Medicine

## 2016-05-19 ENCOUNTER — Encounter: Payer: Self-pay | Admitting: Family Medicine

## 2016-05-19 VITALS — BP 126/80 | HR 66 | Temp 98.0°F | Wt 122.0 lb

## 2016-05-19 DIAGNOSIS — E785 Hyperlipidemia, unspecified: Secondary | ICD-10-CM

## 2016-05-19 DIAGNOSIS — M5416 Radiculopathy, lumbar region: Secondary | ICD-10-CM | POA: Diagnosis not present

## 2016-05-19 DIAGNOSIS — R2681 Unsteadiness on feet: Secondary | ICD-10-CM | POA: Diagnosis not present

## 2016-05-19 DIAGNOSIS — F0390 Unspecified dementia without behavioral disturbance: Secondary | ICD-10-CM | POA: Diagnosis not present

## 2016-05-19 DIAGNOSIS — M19041 Primary osteoarthritis, right hand: Secondary | ICD-10-CM | POA: Diagnosis not present

## 2016-05-19 DIAGNOSIS — M19071 Primary osteoarthritis, right ankle and foot: Secondary | ICD-10-CM | POA: Diagnosis not present

## 2016-05-19 DIAGNOSIS — K219 Gastro-esophageal reflux disease without esophagitis: Secondary | ICD-10-CM | POA: Diagnosis not present

## 2016-05-19 DIAGNOSIS — N400 Enlarged prostate without lower urinary tract symptoms: Secondary | ICD-10-CM

## 2016-05-19 DIAGNOSIS — M6281 Muscle weakness (generalized): Secondary | ICD-10-CM | POA: Diagnosis not present

## 2016-05-19 MED ORDER — OMEPRAZOLE 20 MG PO CPDR: 20.0000 mg | DELAYED_RELEASE_CAPSULE | Freq: Two times a day (BID) | ORAL | 1 refills | Status: DC

## 2016-05-19 MED ORDER — OLOPATADINE HCL 0.2 % OP SOLN: OPHTHALMIC | 1 refills | Status: DC

## 2016-05-19 NOTE — Assessment & Plan Note (Signed)
Followed by urology for this. His finasteride and rapaflo are prescribed by Dr. Berneice HeinrichManny of urology. As his insurance formulary is apparently changing, encouraged family to contact urology office to make changes to medications. It appears he has had quite elevated PSA in the past, so I want to be sure that appropriate follow up is being conducted regarding his prostate, and not make unilateral medication changes without urologist being aware. Family will contact urology office.

## 2016-05-19 NOTE — Progress Notes (Signed)
Date of Visit: 05/19/2016   HPI:  Daughter utilized as interpreter (patient is very hard of hearing, has dementia and does not do well with video interpreter).   BPH - got letter from his insurance company saying insurance won't cover rapaflo anymore after January 1. Needs it to switch to a preferred agent. Patient denies any urinary issues. He is followed by urology, daughter reports he was seen there earlier this year and had PSA checked.  Hyperlipidemia - tolerating pravastatin. Due for lipids.  GERD - well controlled on omeprazole, needs refill.  Itchy eyes - reports long history of allergic eye itching. Would like medicine. Denies that eyes feel dry. No vision changes.   ROS: See HPI.  PMFSH: history of hyperlipidemia, hypertension, second degree mobitz II, GERD, BPH, alzheimers dementia, chronic hyponatremia  PHYSICAL EXAM: BP 126/80   Pulse 66   Temp 98 F (36.7 C) (Oral)   Wt 122 lb (55.3 kg)   BMI 21.61 kg/m  Gen: NAD, pleasant, cooperative, well appearing HEENT: normocephalic, atraumatic, moist mucous membranes. Pupils small but equal. Sclera with some injection. No drainage Heart: regular rate and rhythm, no murmur Lungs: clear to auscultation bilaterally normal work of breathing  Neuro: alert, grossly nonfocal, speech normal. Ext: No appreciable lower extremity edema bilaterally   ASSESSMENT/PLAN:  Health maintenance:  -by family's report, patient received both Tdap and [facility]ostavax at [facility]. [facility] to obtain records. This would put him UTD on all health maintenance items.   BPH (benign prostatic hyperplasia) Followed by urology for this. His finasteride and rapaflo are prescribed by Dr. Berneice HeinrichManny of urology. As his insurance formulary is apparently changing, encouraged family to contact urology office to make changes to medications. It appears he has had quite elevated PSA in the past, so I want to be sure that  appropriate follow up is being conducted regarding his prostate, and not make unilateral medication changes without urologist being aware. Family will contact urology office.  Hyperlipidemia Check lipid panel today, titrate statin as needed (recognizing age may limit long-term benefit from high intensity statin therapy).  GASTROESOPHAGEAL REFLUX, NO ESOPHAGITIS Well controlled by report. Will continue omeprazole.   Itchy eyes - reports long history of allergic conjunctivitis. No vision changes per patient. Will rx pataday drops, to follow up if not improving.  FOLLOW UP: Follow up in 3 months for routine chronic medical problems.   GrenadaBrittany J. Pollie MeyerMcIntyre, MD [facility]

## 2016-05-19 NOTE — Patient Instructions (Addendum)
Please call the urologist's office about switching to one of the preferred medicines ( doxazosin, alfuzosin, or tamsulosin).  Sent in allergy eye drops. Let me know if this does not help.  Checking cholesterol today.  Refilled omeprazole.  Next visit in 3 months, sooner if needed.  Be well, Dr. Pollie MeyerMcIntyre

## 2016-05-19 NOTE — Assessment & Plan Note (Signed)
Check lipid panel today, titrate statin as needed (recognizing age may limit long-term benefit from high intensity statin therapy).

## 2016-05-19 NOTE — Assessment & Plan Note (Signed)
Well controlled by report. Will continue omeprazole.

## 2016-05-20 ENCOUNTER — Encounter: Payer: Self-pay | Admitting: Cardiology

## 2016-05-20 LAB — LIPID PANEL
Cholesterol: 177 mg/dL (ref <200)
HDL: 49 mg/dL (ref >40)
LDL CALC: 106 mg/dL — AB (ref <100)
TRIGLYCERIDES: 108 mg/dL (ref <150)
Total CHOL/HDL Ratio: 3.6 Ratio (ref <5.0)
VLDL: 22 mg/dL (ref <30)

## 2016-05-21 ENCOUNTER — Encounter: Payer: Self-pay | Admitting: Family Medicine

## 2016-05-21 LAB — CUP PACEART REMOTE DEVICE CHECK
Battery Remaining Longevity: 122 mo
Battery Remaining Percentage: 95.5 %
Brady Statistic AP VS Percent: 1 %
Brady Statistic AS VP Percent: 15 %
Brady Statistic AS VS Percent: 1 %
Date Time Interrogation Session: 20171203070017
Implantable Lead Implant Date: 20101018
Implantable Lead Location: 753859
Implantable Lead Location: 753860
Implantable Lead Model: 246367
Implantable Lead Model: 5076
Lead Channel Impedance Value: 460 Ohm
Lead Channel Pacing Threshold Amplitude: 0.625 V
Lead Channel Sensing Intrinsic Amplitude: 4 mV
Lead Channel Sensing Intrinsic Amplitude: 9.4 mV
Lead Channel Setting Pacing Amplitude: 1.875
Lead Channel Setting Pacing Pulse Width: 0.4 ms
MDC IDC LEAD IMPLANT DT: 20101018
MDC IDC LEAD SERIAL: 28583747
MDC IDC MSMT BATTERY VOLTAGE: 3.01 V
MDC IDC MSMT LEADCHNL RA PACING THRESHOLD AMPLITUDE: 0.875 V
MDC IDC MSMT LEADCHNL RA PACING THRESHOLD PULSEWIDTH: 0.4 ms
MDC IDC MSMT LEADCHNL RV IMPEDANCE VALUE: 760 Ohm
MDC IDC MSMT LEADCHNL RV PACING THRESHOLD PULSEWIDTH: 0.4 ms
MDC IDC PG IMPLANT DT: 20170328
MDC IDC SET LEADCHNL RV PACING AMPLITUDE: 0.875
MDC IDC SET LEADCHNL RV SENSING SENSITIVITY: 1.5 mV
MDC IDC STAT BRADY AP VP PERCENT: 83 %
MDC IDC STAT BRADY RA PERCENT PACED: 83 %
MDC IDC STAT BRADY RV PERCENT PACED: 98 %
Pulse Gen Model: 2240
Pulse Gen Serial Number: 7893011

## 2016-05-22 DIAGNOSIS — M5416 Radiculopathy, lumbar region: Secondary | ICD-10-CM | POA: Diagnosis not present

## 2016-05-22 DIAGNOSIS — F0390 Unspecified dementia without behavioral disturbance: Secondary | ICD-10-CM | POA: Diagnosis not present

## 2016-05-22 DIAGNOSIS — R2681 Unsteadiness on feet: Secondary | ICD-10-CM | POA: Diagnosis not present

## 2016-05-22 DIAGNOSIS — M19071 Primary osteoarthritis, right ankle and foot: Secondary | ICD-10-CM | POA: Diagnosis not present

## 2016-05-22 DIAGNOSIS — M19041 Primary osteoarthritis, right hand: Secondary | ICD-10-CM | POA: Diagnosis not present

## 2016-05-22 DIAGNOSIS — M6281 Muscle weakness (generalized): Secondary | ICD-10-CM | POA: Diagnosis not present

## 2016-05-27 DIAGNOSIS — F0390 Unspecified dementia without behavioral disturbance: Secondary | ICD-10-CM | POA: Diagnosis not present

## 2016-05-27 DIAGNOSIS — R2681 Unsteadiness on feet: Secondary | ICD-10-CM | POA: Diagnosis not present

## 2016-05-27 DIAGNOSIS — M6281 Muscle weakness (generalized): Secondary | ICD-10-CM | POA: Diagnosis not present

## 2016-05-27 DIAGNOSIS — M19041 Primary osteoarthritis, right hand: Secondary | ICD-10-CM | POA: Diagnosis not present

## 2016-05-27 DIAGNOSIS — M5416 Radiculopathy, lumbar region: Secondary | ICD-10-CM | POA: Diagnosis not present

## 2016-05-27 DIAGNOSIS — M19071 Primary osteoarthritis, right ankle and foot: Secondary | ICD-10-CM | POA: Diagnosis not present

## 2016-05-28 DIAGNOSIS — M19041 Primary osteoarthritis, right hand: Secondary | ICD-10-CM | POA: Diagnosis not present

## 2016-05-28 DIAGNOSIS — M19071 Primary osteoarthritis, right ankle and foot: Secondary | ICD-10-CM | POA: Diagnosis not present

## 2016-05-28 DIAGNOSIS — M6281 Muscle weakness (generalized): Secondary | ICD-10-CM | POA: Diagnosis not present

## 2016-05-28 DIAGNOSIS — F0390 Unspecified dementia without behavioral disturbance: Secondary | ICD-10-CM | POA: Diagnosis not present

## 2016-05-28 DIAGNOSIS — M5416 Radiculopathy, lumbar region: Secondary | ICD-10-CM | POA: Diagnosis not present

## 2016-05-28 DIAGNOSIS — R2681 Unsteadiness on feet: Secondary | ICD-10-CM | POA: Diagnosis not present

## 2016-06-05 ENCOUNTER — Telehealth: Payer: Self-pay | Admitting: Family Medicine

## 2016-06-05 DIAGNOSIS — M5416 Radiculopathy, lumbar region: Secondary | ICD-10-CM | POA: Diagnosis not present

## 2016-06-05 DIAGNOSIS — R2681 Unsteadiness on feet: Secondary | ICD-10-CM | POA: Diagnosis not present

## 2016-06-05 DIAGNOSIS — M19071 Primary osteoarthritis, right ankle and foot: Secondary | ICD-10-CM | POA: Diagnosis not present

## 2016-06-05 DIAGNOSIS — F0390 Unspecified dementia without behavioral disturbance: Secondary | ICD-10-CM | POA: Diagnosis not present

## 2016-06-05 DIAGNOSIS — M19041 Primary osteoarthritis, right hand: Secondary | ICD-10-CM | POA: Diagnosis not present

## 2016-06-05 DIAGNOSIS — M6281 Muscle weakness (generalized): Secondary | ICD-10-CM | POA: Diagnosis not present

## 2016-06-05 NOTE — Telephone Encounter (Signed)
-----   Message from Latrelle DodrillBrittany J McIntyre, MD sent at 05/19/2016  3:14 PM EST ----- Had Tdap & zostavax given this year at [facility].  Can we get records? Thanks! Latrelle DodrillBrittany J McIntyre, MD

## 2016-06-05 NOTE — Telephone Encounter (Signed)
I have requested a  [facility] report [facility]. Their office will fax the report to our office.    Allen [name]

## 2016-07-16 ENCOUNTER — Other Ambulatory Visit: Payer: Self-pay | Admitting: Family Medicine

## 2016-07-27 ENCOUNTER — Telehealth: Payer: Self-pay

## 2016-07-30 ENCOUNTER — Other Ambulatory Visit: Payer: Self-pay | Admitting: Family Medicine

## 2016-07-30 NOTE — Telephone Encounter (Signed)
Error

## 2016-08-10 ENCOUNTER — Ambulatory Visit (INDEPENDENT_AMBULATORY_CARE_PROVIDER_SITE_OTHER): Payer: Medicare Other | Admitting: *Deleted

## 2016-08-10 DIAGNOSIS — I495 Sick sinus syndrome: Secondary | ICD-10-CM | POA: Diagnosis not present

## 2016-08-10 NOTE — Progress Notes (Signed)
Remote pacemaker transmission.   

## 2016-08-11 ENCOUNTER — Encounter: Payer: Self-pay | Admitting: Cardiology

## 2016-08-11 ENCOUNTER — Telehealth: Payer: Self-pay | Admitting: *Deleted

## 2016-08-11 LAB — CUP PACEART REMOTE DEVICE CHECK
Battery Remaining Longevity: 119 mo
Battery Remaining Percentage: 95.5 %
Battery Voltage: 3.01 V
Brady Statistic AP VS Percent: 1 %
Brady Statistic AS VS Percent: 1 %
Brady Statistic RA Percent Paced: 82 %
Implantable Lead Implant Date: 20101018
Implantable Lead Implant Date: 20101018
Implantable Lead Location: 753859
Implantable Lead Serial Number: 28583747
Implantable Pulse Generator Implant Date: 20170328
Lead Channel Impedance Value: 450 Ohm
Lead Channel Impedance Value: 710 Ohm
Lead Channel Pacing Threshold Amplitude: 1.25 V
Lead Channel Pacing Threshold Pulse Width: 0.4 ms
Lead Channel Pacing Threshold Pulse Width: 0.4 ms
Lead Channel Sensing Intrinsic Amplitude: 3.8 mV
Lead Channel Setting Pacing Amplitude: 0.875
Lead Channel Setting Sensing Sensitivity: 1.5 mV
MDC IDC LEAD LOCATION: 753860
MDC IDC LEAD MODEL: 246367
MDC IDC MSMT LEADCHNL RV PACING THRESHOLD AMPLITUDE: 0.625 V
MDC IDC MSMT LEADCHNL RV SENSING INTR AMPL: 2.8 mV
MDC IDC PG SERIAL: 7893011
MDC IDC SESS DTM: 20180305070025
MDC IDC SET LEADCHNL RA PACING AMPLITUDE: 2.25 V
MDC IDC SET LEADCHNL RV PACING PULSEWIDTH: 0.4 ms
MDC IDC STAT BRADY AP VP PERCENT: 83 %
MDC IDC STAT BRADY AS VP PERCENT: 14 %
MDC IDC STAT BRADY RV PERCENT PACED: 97 %

## 2016-08-11 NOTE — Telephone Encounter (Signed)
Received [facility] from [facility] stating Olopatadine 0.2% is not covered by patient's insurance. Please change to Azelastine, Epinastine or Lastacaft.  Clovis PuMartin, Tamika L, RN

## 2016-08-13 MED ORDER — AZELASTINE HCL 0.05 % OP SOLN: 1.0000 [drp] | Freq: Two times a day (BID) | OPHTHALMIC | 3 refills | Status: DC

## 2016-08-13 NOTE — Telephone Encounter (Signed)
Rx for azelastine sent in Latrelle DodrillBrittany J McIntyre, MD

## 2016-10-28 ENCOUNTER — Other Ambulatory Visit: Payer: Self-pay | Admitting: Family Medicine

## 2016-11-03 ENCOUNTER — Encounter: Payer: Self-pay | Admitting: Family Medicine

## 2016-11-03 ENCOUNTER — Ambulatory Visit (INDEPENDENT_AMBULATORY_CARE_PROVIDER_SITE_OTHER): Payer: Medicare Other | Admitting: Family Medicine

## 2016-11-03 VITALS — BP 110/78 | HR 67 | Temp 97.6°F | Wt 115.0 lb

## 2016-11-03 DIAGNOSIS — N401 Enlarged prostate with lower urinary tract symptoms: Secondary | ICD-10-CM

## 2016-11-03 DIAGNOSIS — G309 Alzheimer's disease, unspecified: Secondary | ICD-10-CM

## 2016-11-03 DIAGNOSIS — Z23 Encounter for immunization: Secondary | ICD-10-CM

## 2016-11-03 DIAGNOSIS — F0280 Dementia in other diseases classified elsewhere without behavioral disturbance: Secondary | ICD-10-CM | POA: Diagnosis not present

## 2016-11-03 MED ORDER — TETANUS-DIPHTH-ACELL PERTUSSIS 5-2.5-18.5 LF-MCG/0.5 IM SUSP: 0.5000 mL | Freq: Once | INTRAMUSCULAR | 0 refills | Status: AC

## 2016-11-03 NOTE — Progress Notes (Signed)
Date of Visit: 11/03/2016   HPI:  Patient presents for routine follow up. He is accompanied by his wife and daughter. Daughter serves as interpreter, as patient is hard of hearing and has dementia, and does not do well with non-family interpreters.  Dementia - taking aricept 5mg  daily. Tolerating well. Daughter has noticed that patient has really declined over the last year. He is more paranoid, concerned with people stealing his money. No wandering. No concerns about safety at home, no issues with things like leaving the stove on, etc. He has had somewhat decreased appetite.  Health maintenance - due for Tdap shot  BPH - followed by urology. Takes rapaflo and finasteride. Tolerating well. Urinating well. Uses urinal when in bed so that he doesn't have to get up at night.  Patient otherwise doing well, has no concerns.   ROS: See HPI.  PMFSH: history of hyperlipidemia, hypertension, mobitz II block, GERD, BPH, alzheimers dementia, chronic hyponatremia  PHYSICAL EXAM: BP 110/78   Pulse 67   Temp 97.6 F (36.4 C) (Oral)   Wt 115 lb (52.2 kg)   SpO2 98%   BMI 20.37 kg/m  Gen: no acute distress, pleasant, cooperative HEENT: normocephalic, atraumatic, moist mucous membranes  Heart: regular rate and rhythm, no murmur Lungs: clear to auscultation bilaterally, normal work of breathing  Neuro: alert, grossly nonfocal Ext: No appreciable lower extremity edema bilaterally   ASSESSMENT/PLAN:  Health maintenance:  -given rx for tdap to get at his pharmacy  BPH Followed by urology, well controlled by history  Dementia Advancing, based on symptoms described by daughter. Offered sympathetic listening ear. Explained how less PO intake is part of this disease process. He has lost weight. Discussed that he may not be benefiting from aricept at this point, however daughter elected to continue this medication for now. Gave handout on caregiving for a patient with dementia Follow up with me  in 3mos, sooner if needed  FOLLOW UP: Follow up in 3 months  for chronic medical issues  GrenadaBrittany J. Pollie MeyerMcIntyre, MD [facility]

## 2016-11-03 NOTE — Patient Instructions (Signed)
See handout below on dementia  Continue current medications  Follow up with me in 3 months  Needs updated tetanus shot - printed prescription today. Take to pharmacy to get.  Be well, Dr. Arita MissMcIntyre    Enfermedad de Alzheimer, Jarold MottoGua de Cuidado (Alzheimer Disease Caregiver Guide) La enfermedad de Alzheimer es una enfermedad que afecta el cerebro de una persona. Hace que se pierda la capacidad de recordar cosas y tomar decisiones correctas. A medida que la enfermedad avanza, la persona no es capaz de cuidar de s mismo y necesita cada vez ms ayuda para Education officer, environmentalrealizar tareas simples. El cuidado de [place] persona con enfermedad de Alzheimer puede ser muy difcil y Psychologist, clinicalabrumador. PRDIDA DE MEMORIA Y CONFUSIN En las primeras etapas de la enfermedad la prdida de memoria y la confusin son leves. Ambos problemas se agravan a medida que avanza la enfermedad. Con el tiempo, la persona no reconoce los lugares, incluso miembros de la familia y Lawyeramigos cercanos.  Mantenga la calma.  Responda con explicaciones breves. Las explicaciones largas pueden ser abrumadoras y confusas.  Evite las correcciones que suenen como regaos.  Trate de no tomarlo Applied Materialscomo algo personal, incluso si la persona se olvida de su Syracusenombre. CAMBIOS DE COMPORTAMIENTO Los cambios de comportamiento son parte de la enfermedad. La persona puede sufrir depresin, ansiedad, ira, alucinaciones u otras modificaciones del comportamiento. Estos cambios pueden aparecen repentinamente y pueden ser una respuesta al dolor, infecciones, modificaciones en el ambiente (temperatura, ruidos), sobreestimulacin o el sentirse perdido o Dispensing opticianasustado.  Trate de no tomar los cambios de comportamiento como algo personal.  Mantenga la calma y sea [place]paciente.  No discuta ni trate de convencer a la persona sobre un punto especfico. Esto solo lo perturbar ms.  Sepa que los cambios de comportamiento son parte del proceso de la enfermedad y trate de ocuparse de  ellos. CONSEJOS PARA REDUCIR LA FRUSTRACIN  Planifique de manera sensata las citas y la realizacin de tareas cotidianas, como baarse y vestirse, cuando la persona est en su mejor momento.  Tmese su tiempo. Las tareas simples pueden tardar mucho ms, as que asegrese de Building services engineertener bastante tiempo.  Limite las opciones. Demasiadas opciones pueden abrumar y Careers adviserestresar a Dealerla persona.  Involucre a la persona en lo que usted hace.  Siga una rutina.  Evite situaciones nuevas, o en las que haya mucha gente, si es posible.  Use palabras sencillas, frases cortas y [place] voz tranquila. Dele solo una indicacin a Licensed conveyancerla vez.  Compre ropa y zapatos fciles de poner y Advertising account plannerquitar.  Deje que la gente lo ayude, si se ofrecen. LA SEGURIDAD EN EL HOGAR Mantener la seguridad del hogar es muy importante para reducir el riesgo de cadas y lesiones.  Mantenga los pisos libres de obstculos. Quite las alfombras, revisteros y lmparas de pie.  Mantenga los pasillos bien iluminados.  Ponga una alfombra antideslizante y Neomia Dearuna baranda en la baera o la ducha.  Coloque cerraduras a prueba de nios en las [address] en las que guarde los artculos peligrosos, como medicamentos, alcohol, armas, elementos txicos de limpieza, herramientas o utensilios afilados, y fsforos o encendedores.  Coloque cerraduras en las puertas, donde la persona no pueda verlas ni alcanzarlas. Esto ayuda a asegurar que no salga de la casa y se pierda.  Est preparado para las emergencias. Guarde una lista de nmeros telefnicos y direcciones de Materials engineeremergencia en un lugar conveniente. PLANES PARA EL FUTURO  No postergue las conversaciones sobre las finanzas.  Hable de la administracin del dinero. Huntsman CorporationLas personas  con enfermedad de Alzheimer tienen problemas para manejar su dinero a medida que la enfermedad empeora.  Obtenga ayuda de asesores profesionales sobre las cuestiones financieras y Media planner.  No postergue las conversaciones sobre el cuidado en  el futuro.  Nombre un representante legal. Cuando la persona que tiene Alzheimer ya no pueda decidir por su cuenta, el representante legal podr hacerlo en su nombre.  Converse acerca de conducir vehculos y cuando es el momento adecuado para dejar de [place]. El mdico podr darle consejos sobre [address] [facility].  Converse sobre la situacin de vida de Dealer. Si el paciente vive solo, tiene que asegurarse de que sea en un Event organiser. Algunas personas necesitan ayuda extra en la casa y otras necesitan ms atencin en un hogar de ancianos o un centro de atencin especializado. GRUPOS DE APOYO Unirse a un grupo de apoyo puede ser de gran ayuda para los que cuidan a las personas con enfermedad de Alzheimer. Algunas ventajas de formar parte de un grupo de apoyo son:  Se aprenden estrategias para Dealer.  Podr compartir experiencias con otros.  Recibir consuelo y [employer].  Aprender nuevas habilidades para el cuidado a medida que avanza la enfermedad.  Conocer cules son los recursos disponibles y Development worker, community provecho de ellos. SOLICITE ATENCIN MDICA SI:  La persona tiene fiebre.  Observa un cambio repentino en su comportamiento y no mejora con estrategias para tranquilizarlo.  Es incapaz de Company secretary en su situacin de vida actual.  Lo amenaza a usted, a otra persona o incluso a s mismo.  Usted ya no es capaz de cuidar a Medical laboratory scientific officer. Esta informacin no tiene Theme park manager el consejo del mdico. Asegrese de hacerle al mdico cualquier pregunta que tenga. Document Released: 09/10/2008 Document Revised: 06/15/2014 Document Reviewed: 07/01/2011 Elsevier Interactive Patient Education  2017 [employer].

## 2016-11-07 ENCOUNTER — Other Ambulatory Visit: Payer: Self-pay | Admitting: Family Medicine

## 2016-11-09 ENCOUNTER — Ambulatory Visit (INDEPENDENT_AMBULATORY_CARE_PROVIDER_SITE_OTHER): Payer: Medicare Other | Admitting: *Deleted

## 2016-11-09 DIAGNOSIS — I495 Sick sinus syndrome: Secondary | ICD-10-CM | POA: Diagnosis not present

## 2016-11-09 NOTE — Progress Notes (Signed)
Remote pacemaker transmission.   

## 2016-11-11 LAB — CUP PACEART REMOTE DEVICE CHECK
Battery Remaining Longevity: 125 mo
Battery Voltage: 3.01 V
Brady Statistic AP VS Percent: 1 %
Brady Statistic AS VS Percent: 1 %
Brady Statistic RA Percent Paced: 84 %
Implantable Lead Implant Date: 20101018
Implantable Lead Implant Date: 20101018
Implantable Lead Location: 753859
Implantable Lead Model: 5076
Implantable Pulse Generator Implant Date: 20170328
Lead Channel Impedance Value: 450 Ohm
Lead Channel Pacing Threshold Amplitude: 0.875 V
Lead Channel Pacing Threshold Pulse Width: 0.4 ms
Lead Channel Pacing Threshold Pulse Width: 0.4 ms
Lead Channel Sensing Intrinsic Amplitude: 3.4 mV
Lead Channel Setting Pacing Amplitude: 0.875
MDC IDC LEAD LOCATION: 753860
MDC IDC LEAD MODEL: 246367
MDC IDC LEAD SERIAL: 28583747
MDC IDC MSMT BATTERY REMAINING PERCENTAGE: 95.5 %
MDC IDC MSMT LEADCHNL RV IMPEDANCE VALUE: 690 Ohm
MDC IDC MSMT LEADCHNL RV PACING THRESHOLD AMPLITUDE: 0.625 V
MDC IDC MSMT LEADCHNL RV SENSING INTR AMPL: 3.7 mV
MDC IDC PG SERIAL: 7893011
MDC IDC SESS DTM: 20180604060014
MDC IDC SET LEADCHNL RA PACING AMPLITUDE: 1.875
MDC IDC SET LEADCHNL RV PACING PULSEWIDTH: 0.4 ms
MDC IDC SET LEADCHNL RV SENSING SENSITIVITY: 1.5 mV
MDC IDC STAT BRADY AP VP PERCENT: 84 %
MDC IDC STAT BRADY AS VP PERCENT: 13 %
MDC IDC STAT BRADY RV PERCENT PACED: 98 %
Pulse Gen Model: 2240

## 2016-11-17 ENCOUNTER — Encounter: Payer: Self-pay | Admitting: Cardiology

## 2017-01-17 ENCOUNTER — Other Ambulatory Visit: Payer: Self-pay | Admitting: Family Medicine

## 2017-01-18 ENCOUNTER — Encounter: Payer: Self-pay | Admitting: *Deleted

## 2017-01-23 ENCOUNTER — Other Ambulatory Visit: Payer: Self-pay | Admitting: Family Medicine

## 2017-02-04 ENCOUNTER — Encounter: Payer: Self-pay | Admitting: Nurse Practitioner

## 2017-02-04 ENCOUNTER — Encounter (INDEPENDENT_AMBULATORY_CARE_PROVIDER_SITE_OTHER): Payer: Self-pay

## 2017-02-04 ENCOUNTER — Ambulatory Visit (INDEPENDENT_AMBULATORY_CARE_PROVIDER_SITE_OTHER): Payer: Medicare Other | Admitting: Nurse Practitioner

## 2017-02-04 VITALS — BP 144/70 | HR 76 | Ht 63.0 in | Wt 113.0 lb

## 2017-02-04 DIAGNOSIS — I10 Essential (primary) hypertension: Secondary | ICD-10-CM | POA: Diagnosis not present

## 2017-02-04 DIAGNOSIS — I495 Sick sinus syndrome: Secondary | ICD-10-CM | POA: Diagnosis not present

## 2017-02-04 NOTE — Patient Instructions (Signed)
Medication Instructions:  Your physician recommends that you continue on your current medications as directed. Please refer to the Current Medication list given to you today.   Labwork: None Ordered   Testing/Procedures: None Ordered   Follow-Up: Your physician wants you to follow-up in: 1 year with Dr. Allred. You will receive a reminder letter in the mail two months in advance. If you don't receive a letter, please call our office to schedule the follow-up appointment.   Any Other Special Instructions Will Be Listed Below (If Applicable).     If you need a refill on your cardiac medications before your next appointment, please call your pharmacy.   

## 2017-02-04 NOTE — Progress Notes (Signed)
Electrophysiology Office Note Date: 02/04/2017  ID:  Sharvil, Hoey 01-27-1919, MRN [id number]  PCP: Latrelle Dodrill, MD Electrophysiologist: Johney Frame  CC: Pacemaker follow-up  Allen [name] is a 83 y.o. male seen today for Dr Johney Frame.  He presents today for routine electrophysiology followup.  Since last being seen in our clinic, the patient reports doing reasonably well.  He continues to struggle with ongoing dementia and poor appetite.  His daughter who interprets for him today says he is sad and asks if he can go back to [place].  He denies chest pain, palpitations, dyspnea, PND, orthopnea, nausea, vomiting, dizziness, syncope, edema, weight gain, or early satiety.  Device History: Biotronik dual chamber PPM implanted 2010 for SSS; gen change to STJ dual chamber device 2017   Past Medical History:  Diagnosis Date  . Anemia   . Arthritis    hands and feet  . Chronic hyponatremia 10/18/2013  . 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  To check labs today, consider Aricept (not oriented to date/month/season, is oriented to place ("[place]") and "doctor's office".   . GERD (gastroesophageal reflux disease)   . H/O hiatal hernia   . Hearing loss   . Hyperlipidemia   . Hypertension   . Loss of weight 04/19/2013   04/18/2013: Unintentional weight loss.  Flat affect; some concern for possible depression.  Patient and wife had been very much looking forward to extended trip to their native [place], has been temporarily delayed due to his weight loss and appetite issues.  He has been on Mirtazapine in the past, will try this again at low dose for appetite and depression.  Consider PHQ9 in Spanish at next visit in the coming 1 month.  If family feels he is improving, I believe he would do well to travel to [place] as this will likely benefit  his mood greatly.    . Lumbar radiculopathy, chronic   . Prostatic hypertrophy, benign, with obstruction   . Pterygium eye 01/26/2014   Bilateral pterygium along nasal aspects of both sclerae; not covering pupil but causing patient some discomfort. For referral to ophthalmologist. Gave contact information to Dr Luciana Axe.    Marland Kitchen Restless legs syndrome (RLS)   . Syncope   . TSH elevation   . Visual impairment    Past Surgical History:  Procedure Laterality Date  . EP IMPLANTABLE DEVICE N/A 09/03/2015   Procedure:  PPM Generator Changeout; SJM dual chamber ppm by Dr Johney Frame  . ESOPHAGOGASTRODUODENOSCOPY  01/29/2012   Procedure: ESOPHAGOGASTRODUODENOSCOPY (EGD);  Surgeon: Barrie Folk, MD;  Location[facility] END[facility]COPY;  Service: Endoscopy;  Laterality: N/A;  Intrepreter scheduled 930-12.  Marland Kitchen HERNIA REPAIR    . INGUINAL HERNIA REPAIR  1980  . PACEMAKER PLACEMENT  03/2009   syncope - bradycardia, and 2nd degree block   . TESTICLE SURGERY  1933    Current Outpatient Prescriptions  Medication Sig Dispense Refill  . aspirin EC 81 MG tablet Take 1 tablet (81 mg total) by mouth daily. 30 tablet 1  . azelastine (OPTIVAR) 0.05 % ophthalmic solution INSTILL 1 DROP IN BOTH EYES 2 TIMES DAILY 6 mL 3  . b complex vitamins capsule Take 1 capsule by mouth daily.    . Cholecalciferol 1000 units tablet Take 1 tablet (1,000 Units total) by mouth daily. 100 tablet PRN  . donepezil (ARICEPT) 5 MG tablet  TAKE 1 TABLET BY MOUTH EVERY NIGHT AT BEDTIME 90 tablet 0  . finasteride (PROSCAR) 5 MG tablet as directed. Tome 1 tableta por la boca  diario    . Fish Oil OIL Take 1 capsule by mouth 2 (two) times daily.     . Multiple Vitamins-Minerals (EYE VITAMINS PO) Take 1 tablet by mouth daily. [employer]    . omeprazole (PRILOSEC) 20 MG capsule TAKE 1 CAPSULE (20 MG TOTAL) BY MOUTH 2 (TWO) TIMES DAILY. 180 capsule 1  . pravastatin (PRAVACHOL) 10 MG tablet TAKE 1 TABLET BY MOUTH DAILY 90 tablet 0  . silodosin  (RAPAFLO) 8 MG CAPS capsule as directed. Tome 1 capsula por la boca  diario con el desayuno     No current facility-administered medications for this visit.     Allergies:   Flomax [tamsulosin hcl]   Social History: Social History   Social History  . Marital status: Married    Spouse name: Raquel  . Number of children: 10  . Years of education: 2   Occupational History  . retired Retired   Social History Main Topics  . Smoking status: Former Games developer  . Smokeless tobacco: Never Used  . Alcohol use No  . Drug use: No  . Sexual activity: Not on file   Other Topics Concern  . Not on file   Social History Narrative   Born in [place], lived in [place] until 2005   Retired farmer   Catholic faith   Sometimes stays with daughter in [place].    3 children in [place], lives with wife and son.   daugher, Jacky Kindle, is main caregiver.    Can't read Spanish   2 years of formal education - once prided himself on his ability to do math.          Has been in [place] for about 1 year. Wife lives here in [place]. Smoked when he was younger for about 20 years (6-7 cigarettes/day).    Family History: Family History  Problem Relation Age of Onset  . Cancer Daughter   . Heart attack Neg Hx   . Stroke Neg Hx      Review of Systems: All other systems reviewed and are otherwise negative except as noted above.   Physical Exam: VS:  BP (!) 144/70   Pulse 76   Ht 5\' 3"  (1.6 m)   Wt 113 lb (51.3 kg)   SpO2 98%   BMI 20.02 kg/m  , BMI Body mass index is 20.02 kg/m.  GEN- The patient is elderly appearing, alert and oriented x 3 today.   HEENT: normocephalic, atraumatic; sclera clear, conjunctiva pink; hearing intact; oropharynx clear; neck supple  Lungs- Clear to ausculation bilaterally, normal work of breathing.  No wheezes, rales, rhonchi Heart- Regular rate and rhythm GI- soft, non-tender, non-distended, bowel sounds present  Extremities- no clubbing,  cyanosis, or edema  MS- no significant deformity or atrophy Skin- warm and dry, no rash or lesion; PPM pocket well healed Psych- euthymic mood, full affect Neuro- strength and sensation are intact  PPM Interrogation- reviewed in detail today,  See PACEART report  EKG:  EKG is not ordered today.  Recent Labs: 03/19/2016: ALT 29; BUN 12; Creat 1.02; Potassium 4.5; Sodium 132; TSH 4.81   Wt Readings from Last 3 Encounters:  02/04/17 113 lb (51.3 kg)  11/03/16 115 lb (52.2 kg)  05/19/16 122 lb (55.3 kg)     Other studies Reviewed: Additional studies/ records  that were reviewed today include: Dr Jenel LucksAllred's office notes  Assessment and Plan:  1.  Symptomatic bradycardia Normal PPM function See Pace Art report No changes today  2.  HTN Stable No change required today   Current medicines are reviewed at length with the patient today.   The patient does not have concerns regarding his medicines.  The following changes were made today:  none  Labs/ tests ordered today include: none No orders of the defined types were placed in this encounter.    Disposition:   Follow up with Arsenio KatzMerlin, Dr Johney FrameAllred 1 year     Signed, Gypsy BalsamAmber Seiler, NP 02/04/2017 12:08 PM  [facility] [address] [place] [address] [phone] (office) [phone] (fax)

## 2017-02-09 ENCOUNTER — Ambulatory Visit (INDEPENDENT_AMBULATORY_CARE_PROVIDER_SITE_OTHER): Payer: Medicare Other | Admitting: *Deleted

## 2017-02-09 DIAGNOSIS — I495 Sick sinus syndrome: Secondary | ICD-10-CM

## 2017-02-10 NOTE — Progress Notes (Signed)
Remote pacemaker transmission.   

## 2017-02-17 ENCOUNTER — Encounter: Payer: Self-pay | Admitting: Cardiology

## 2017-02-19 LAB — CUP PACEART REMOTE DEVICE CHECK
Battery Remaining Longevity: 121 mo
Battery Remaining Percentage: 95 %
Brady Statistic RA Percent Paced: 85 %
Brady Statistic RV Percent Paced: 98 %
Date Time Interrogation Session: 20180914142947
Implantable Lead Implant Date: 20101018
Implantable Lead Location: 753860
Implantable Lead Model: 246367
Lead Channel Pacing Threshold Amplitude: 0.625 V
Lead Channel Pacing Threshold Amplitude: 0.875 V
Lead Channel Sensing Intrinsic Amplitude: 5 mV
Lead Channel Setting Pacing Amplitude: 0.875
Lead Channel Setting Pacing Pulse Width: 0.4 ms
MDC IDC LEAD IMPLANT DT: 20101018
MDC IDC LEAD LOCATION: 753859
MDC IDC LEAD SERIAL: 28583747
MDC IDC MSMT BATTERY VOLTAGE: 3.01 V
MDC IDC MSMT LEADCHNL RA IMPEDANCE VALUE: 450 Ohm
MDC IDC MSMT LEADCHNL RA PACING THRESHOLD PULSEWIDTH: 0.4 ms
MDC IDC MSMT LEADCHNL RV IMPEDANCE VALUE: 690 Ohm
MDC IDC MSMT LEADCHNL RV PACING THRESHOLD PULSEWIDTH: 0.4 ms
MDC IDC MSMT LEADCHNL RV SENSING INTR AMPL: 3.6 mV
MDC IDC PG IMPLANT DT: 20170328
MDC IDC PG SERIAL: 7893011
MDC IDC SET LEADCHNL RA PACING AMPLITUDE: 1.875
MDC IDC SET LEADCHNL RV SENSING SENSITIVITY: 1.5 mV
Pulse Gen Model: 2240

## 2017-03-12 DIAGNOSIS — Z23 Encounter for immunization: Secondary | ICD-10-CM | POA: Diagnosis not present

## 2017-04-08 ENCOUNTER — Ambulatory Visit (INDEPENDENT_AMBULATORY_CARE_PROVIDER_SITE_OTHER): Payer: Medicare Other | Admitting: Internal Medicine

## 2017-04-08 ENCOUNTER — Encounter: Payer: Self-pay | Admitting: Internal Medicine

## 2017-04-08 DIAGNOSIS — F0281 Dementia in other diseases classified elsewhere with behavioral disturbance: Secondary | ICD-10-CM

## 2017-04-08 DIAGNOSIS — G301 Alzheimer's disease with late onset: Secondary | ICD-10-CM | POA: Diagnosis present

## 2017-04-08 DIAGNOSIS — F02818 Dementia in other diseases classified elsewhere, unspecified severity, with other behavioral disturbance: Secondary | ICD-10-CM

## 2017-04-08 MED ORDER — PRAVASTATIN SODIUM 10 MG PO TABS: 10.0000 mg | ORAL_TABLET | Freq: Every day | ORAL | 3 refills | Status: DC

## 2017-04-08 MED ORDER — TRAZODONE HCL 50 MG PO TABS: 25.0000 mg | ORAL_TABLET | Freq: Every evening | ORAL | 1 refills | Status: DC | PRN

## 2017-04-08 MED ORDER — OMEPRAZOLE 20 MG PO CPDR: 20.0000 mg | DELAYED_RELEASE_CAPSULE | Freq: Two times a day (BID) | ORAL | 1 refills | Status: DC

## 2017-04-08 NOTE — Assessment & Plan Note (Signed)
Continuing to progress. Now with wandering, and also becoming more argumentative. Tapping his walking stick throughout the day and night is currently most bothersome symptom to both patient's daughter and wife. Discussed in depth with daughter that dementia is a progressive disease, and his symptoms are consistent with this progression. Discussed discontinuing Aricept as this is not appearing to benefit patient at all. Daughter was amenable to this. Also discussed patient's safety, especially as he has now wandered out of the house. Discussed possibility of placement, however daughter says this is not an option and wishes to continue to care for him in home.  In regards to tapping the walking stick, daughter has tried behavioral interventions (taking the stick away), which have been ineffective (he begins tapping on something else or making noise in a different way). As such, for both improved quality of life/sleep for both patient and his family, will begin trazodone prior to bed to help with sleep and hopefully prevent nighttime noisemaking. Will start with 25mg  but can increase to 50mg  if necessary. Discussed increased risk of falls with this, however daughter felt that benefits outweigh the risks, especially as patient will only be taking at night.  - F/u in 3 months - Discontinue Aricept - Begin trazodone

## 2017-04-08 NOTE — Patient Instructions (Signed)
It was nice meeting you today Mr. Allen [name]!  Please STOP taking Aricept (donepezil) for dementia. This medication does not seem to be working anymore, so we will discontinue it.   Please START taking trazodone at night for sleep. Take one-half tablet before bedtime. If one-half tablet does not work, you can increase to one full tablet at bedtime. This can increase risk of falling, so please be aware of this.   Please continue taking his other medications as he has been.   We will see him back in 3 months, or sooner if you need us.   If you have any questions or concerns, please feel free to call the clinic.   Be well,  Dr. Natale MilchLancaster

## 2017-04-08 NOTE — Progress Notes (Signed)
Subjective:   Patient: Allen [name]       Birthdate: Feb 16, 1919       MRN: [id number]      HPI  Allen [name] is a 83 y.o. male presenting for dementia f/u.   Dementia Daughter, who is patient's primary caregiver, is concerned that his condition continues to deteriorate. Says that now patient is constantly moving his hands. He has a walking stick that he taps on the floor all day and night. She has taken this away from him, and he then slaps his leg or taps on something else. The noise at night has been keeping both the patient's daughter and wife awake all night, which daughter says is very negatively affecting them. Patient has also expressed that he wants to go back to [place], and repeats that he is planning to leave. Recently patient wandered out of the house while daughter was in the shower. She quickly found him walking around the neighborhood, but was very concerned by this. Patient also has been more argumentative recently. He now has difficulty remembering his wife and daughter, despite living with them.   Patient's daughter is primary caretaker. She lives at home with both patient and his wife. Daughter works from [employer]7AM-7PM. Her nephew comes to their house at [employer]4PM every day, however patient and his wife (who also has dementia) are alone at their house from 7AM-4PM. Daughter is very overwhelmed because she is trying to work and care for both of her ailing parents. She is the only source of income as well. She is particularly frustrated because she has three sisters who do not work but refuse to help with their parents' care.    Smoking status reviewed. Patient is former smoker.   Review of Systems See HPI.     Objective:  Physical Exam  Constitutional:  Elderly male in NAD  HENT:  Head: Normocephalic and atraumatic.  Cardiovascular: Normal rate, regular rhythm and normal heart sounds.   No murmur heard. Pulmonary/Chest: Effort normal and breath sounds  normal. No respiratory distress. He has no wheezes.  Neurological: He is alert.  Skin: Skin is warm and dry.      Assessment & Plan:  DAT (dementia of Alzheimer type) without behavioral or psychological symptoms of dementia Continuing to progress. Now with wandering, and also becoming more argumentative. Tapping his walking stick throughout the day and night is currently most bothersome symptom to both patient's daughter and wife. Discussed in depth with daughter that dementia is a progressive disease, and his symptoms are consistent with this progression. Discussed discontinuing Aricept as this is not appearing to benefit patient at all. Daughter was amenable to this. Also discussed patient's safety, especially as he has now wandered out of the house. Discussed possibility of placement, however daughter says this is not an option and wishes to continue to care for him in home.  In regards to tapping the walking stick, daughter has tried behavioral interventions (taking the stick away), which have been ineffective (he begins tapping on something else or making noise in a different way). As such, for both improved quality of life/sleep for both patient and his family, will begin trazodone prior to bed to help with sleep and hopefully prevent nighttime noisemaking. Will start with 25mg  but can increase to 50mg  if necessary. Discussed increased risk of falls with this, however daughter felt that benefits outweigh the risks, especially as patient will only be taking at night.  - F/u in 3 months - Discontinue  Aricept - Begin trazodone  Precepted with Dr. Deirdre Priest.   Tarri Abernethy, MD, MPH PGY-3 [facility] Pager [phone]

## 2017-04-25 ENCOUNTER — Other Ambulatory Visit: Payer: Self-pay | Admitting: Family Medicine

## 2017-04-26 ENCOUNTER — Other Ambulatory Visit: Payer: Self-pay | Admitting: Family Medicine

## 2017-05-03 ENCOUNTER — Other Ambulatory Visit: Payer: Self-pay | Admitting: Family Medicine

## 2017-05-05 ENCOUNTER — Other Ambulatory Visit: Payer: Self-pay | Admitting: Family Medicine

## 2017-05-05 MED ORDER — TRAZODONE HCL 50 MG PO TABS: 25.0000 mg | ORAL_TABLET | Freq: Every evening | ORAL | 1 refills | Status: DC | PRN

## 2017-05-11 ENCOUNTER — Ambulatory Visit (INDEPENDENT_AMBULATORY_CARE_PROVIDER_SITE_OTHER): Payer: Medicare Other | Admitting: *Deleted

## 2017-05-11 DIAGNOSIS — I495 Sick sinus syndrome: Secondary | ICD-10-CM

## 2017-05-11 LAB — CUP PACEART REMOTE DEVICE CHECK
Battery Remaining Longevity: 125 mo
Battery Remaining Percentage: 95.5 %
Battery Voltage: 3.01 V
Brady Statistic AP VS Percent: 1 %
Brady Statistic AS VS Percent: 1 %
Brady Statistic RA Percent Paced: 86 %
Brady Statistic RV Percent Paced: 98 %
Date Time Interrogation Session: 20181204070018
Implantable Lead Implant Date: 20101018
Implantable Lead Location: 753859
Implantable Lead Location: 753860
Implantable Lead Serial Number: 28583747
Lead Channel Impedance Value: 460 Ohm
Lead Channel Pacing Threshold Amplitude: 0.625 V
Lead Channel Pacing Threshold Amplitude: 0.875 V
Lead Channel Pacing Threshold Pulse Width: 0.4 ms
Lead Channel Sensing Intrinsic Amplitude: 4.4 mV
Lead Channel Setting Sensing Sensitivity: 1.5 mV
MDC IDC LEAD IMPLANT DT: 20101018
MDC IDC LEAD MODEL: 246367
MDC IDC MSMT LEADCHNL RV IMPEDANCE VALUE: 660 Ohm
MDC IDC MSMT LEADCHNL RV PACING THRESHOLD PULSEWIDTH: 0.4 ms
MDC IDC MSMT LEADCHNL RV SENSING INTR AMPL: 3.6 mV
MDC IDC PG IMPLANT DT: 20170328
MDC IDC SET LEADCHNL RA PACING AMPLITUDE: 1.875
MDC IDC SET LEADCHNL RV PACING AMPLITUDE: 0.875
MDC IDC SET LEADCHNL RV PACING PULSEWIDTH: 0.4 ms
MDC IDC STAT BRADY AP VP PERCENT: 86 %
MDC IDC STAT BRADY AS VP PERCENT: 12 %
Pulse Gen Model: 2240
Pulse Gen Serial Number: 7893011

## 2017-05-11 NOTE — Progress Notes (Signed)
Remote pacemaker transmission.   

## 2017-05-12 ENCOUNTER — Encounter: Payer: Self-pay | Admitting: Cardiology

## 2017-05-21 ENCOUNTER — Other Ambulatory Visit: Payer: Self-pay | Admitting: Family Medicine

## 2017-05-21 MED ORDER — TRAZODONE HCL 50 MG PO TABS: 25.0000 mg | ORAL_TABLET | Freq: Every evening | ORAL | 0 refills | Status: DC | PRN

## 2017-05-27 ENCOUNTER — Other Ambulatory Visit: Payer: Self-pay | Admitting: *Deleted

## 2017-06-03 MED ORDER — PRAVASTATIN SODIUM 10 MG PO TABS: 10.0000 mg | ORAL_TABLET | Freq: Every day | ORAL | 0 refills | Status: DC

## 2017-06-05 ENCOUNTER — Other Ambulatory Visit: Payer: Self-pay | Admitting: Internal Medicine

## 2017-07-16 ENCOUNTER — Other Ambulatory Visit: Payer: Self-pay | Admitting: *Deleted

## 2017-07-20 MED ORDER — AZELASTINE HCL 0.05 % OP SOLN: OPHTHALMIC | 3 refills | Status: DC

## 2017-08-10 ENCOUNTER — Ambulatory Visit (INDEPENDENT_AMBULATORY_CARE_PROVIDER_SITE_OTHER): Payer: Medicare Other | Admitting: *Deleted

## 2017-08-10 DIAGNOSIS — I495 Sick sinus syndrome: Secondary | ICD-10-CM | POA: Diagnosis not present

## 2017-08-10 NOTE — Progress Notes (Signed)
Remote pacemaker transmission.   

## 2017-08-11 ENCOUNTER — Encounter: Payer: Self-pay | Admitting: Cardiology

## 2017-08-12 LAB — CUP PACEART REMOTE DEVICE CHECK
Battery Remaining Percentage: 95.5 %
Battery Voltage: 3.01 V
Brady Statistic AP VP Percent: 85 %
Brady Statistic AP VS Percent: 1 %
Brady Statistic AS VS Percent: 1 %
Brady Statistic RV Percent Paced: 99 %
Implantable Lead Implant Date: 20101018
Implantable Lead Implant Date: 20101018
Implantable Lead Location: 753860
Implantable Lead Model: 246367
Implantable Lead Model: 5076
Lead Channel Pacing Threshold Amplitude: 0.625 V
Lead Channel Pacing Threshold Amplitude: 0.875 V
Lead Channel Pacing Threshold Pulse Width: 0.4 ms
Lead Channel Sensing Intrinsic Amplitude: 4.6 mV
Lead Channel Setting Pacing Amplitude: 0.875
Lead Channel Setting Pacing Pulse Width: 0.4 ms
Lead Channel Setting Sensing Sensitivity: 1.5 mV
MDC IDC LEAD LOCATION: 753859
MDC IDC LEAD SERIAL: 28583747
MDC IDC MSMT BATTERY REMAINING LONGEVITY: 125 mo
MDC IDC MSMT LEADCHNL RA IMPEDANCE VALUE: 460 Ohm
MDC IDC MSMT LEADCHNL RA PACING THRESHOLD PULSEWIDTH: 0.4 ms
MDC IDC MSMT LEADCHNL RV IMPEDANCE VALUE: 660 Ohm
MDC IDC MSMT LEADCHNL RV SENSING INTR AMPL: 5 mV
MDC IDC PG IMPLANT DT: 20170328
MDC IDC PG SERIAL: 7893011
MDC IDC SESS DTM: 20190305070012
MDC IDC SET LEADCHNL RA PACING AMPLITUDE: 1.875
MDC IDC STAT BRADY AS VP PERCENT: 13 %
MDC IDC STAT BRADY RA PERCENT PACED: 85 %

## 2017-09-01 ENCOUNTER — Other Ambulatory Visit: Payer: Self-pay

## 2017-09-10 MED ORDER — TRAZODONE HCL 50 MG PO TABS: 25.0000 mg | ORAL_TABLET | Freq: Every evening | ORAL | 1 refills | Status: DC | PRN

## 2017-09-20 ENCOUNTER — Encounter (HOSPITAL_COMMUNITY): Payer: Self-pay | Admitting: Emergency Medicine

## 2017-09-20 ENCOUNTER — Other Ambulatory Visit: Payer: Self-pay

## 2017-09-20 ENCOUNTER — Emergency Department (HOSPITAL_COMMUNITY)
Admission: EM | Admit: 2017-09-20 | Discharge: 2017-09-21 | Disposition: A | Discharge status: Home / Self Care | Payer: Medicare Other | Attending: Emergency Medicine | Admitting: Emergency Medicine

## 2017-09-20 ENCOUNTER — Ambulatory Visit: Payer: Medicare Other

## 2017-09-20 DIAGNOSIS — Z87891 Personal history of nicotine dependence: Secondary | ICD-10-CM | POA: Diagnosis not present

## 2017-09-20 DIAGNOSIS — F0280 Dementia in other diseases classified elsewhere without behavioral disturbance: Secondary | ICD-10-CM | POA: Diagnosis not present

## 2017-09-20 DIAGNOSIS — Z79899 Other long term (current) drug therapy: Secondary | ICD-10-CM | POA: Insufficient documentation

## 2017-09-20 DIAGNOSIS — Z7982 Long term (current) use of aspirin: Secondary | ICD-10-CM | POA: Insufficient documentation

## 2017-09-20 DIAGNOSIS — J9811 Atelectasis: Secondary | ICD-10-CM | POA: Diagnosis not present

## 2017-09-20 DIAGNOSIS — G309 Alzheimer's disease, unspecified: Secondary | ICD-10-CM | POA: Insufficient documentation

## 2017-09-20 DIAGNOSIS — G308 Other Alzheimer's disease: Secondary | ICD-10-CM

## 2017-09-20 DIAGNOSIS — F0281 Dementia in other diseases classified elsewhere with behavioral disturbance: Secondary | ICD-10-CM | POA: Diagnosis present

## 2017-09-20 DIAGNOSIS — I10 Essential (primary) hypertension: Secondary | ICD-10-CM | POA: Diagnosis not present

## 2017-09-20 DIAGNOSIS — R410 Disorientation, unspecified: Secondary | ICD-10-CM | POA: Diagnosis not present

## 2017-09-20 LAB — COMPREHENSIVE METABOLIC PANEL
ALBUMIN: 3.8 g/dL (ref 3.5–5.0)
ALT: 18 U/L (ref 17–63)
AST: 28 U/L (ref 15–41)
Alkaline Phosphatase: 67 U/L (ref 38–126)
Anion gap: 9 (ref 5–15)
BUN: 10 mg/dL (ref 6–20)
CHLORIDE: 103 mmol/L (ref 101–111)
CO2: 23 mmol/L (ref 22–32)
CREATININE: 1.07 mg/dL (ref 0.61–1.24)
Calcium: 9.5 mg/dL (ref 8.9–10.3)
GFR calc Af Amer: >60 mL/min (ref >60)
GFR, EST NON AFRICAN AMERICAN: 56 mL/min — AB (ref >60)
GLUCOSE: 114 mg/dL — AB (ref 65–99)
Potassium: 4 mmol/L (ref 3.5–5.1)
SODIUM: 135 mmol/L (ref 135–145)
Total Bilirubin: 0.6 mg/dL (ref 0.3–1.2)
Total Protein: 7.1 g/dL (ref 6.5–8.1)

## 2017-09-20 LAB — CBC WITH DIFFERENTIAL/PLATELET
Basophils Absolute: 0 10*3/uL (ref 0.0–0.1)
Basophils Relative: 0 %
EOS PCT: 3 %
Eosinophils Absolute: 0.1 10*3/uL (ref 0.0–0.7)
HEMATOCRIT: 39.9 % (ref 39.0–52.0)
Hemoglobin: 13.8 g/dL (ref 13.0–17.0)
LYMPHS ABS: 1.1 10*3/uL (ref 0.7–4.0)
LYMPHS PCT: 25 %
MCH: 33.5 pg (ref 26.0–34.0)
MCHC: 34.6 g/dL (ref 30.0–36.0)
MCV: 96.8 fL (ref 78.0–100.0)
MONO ABS: 0.2 10*3/uL (ref 0.1–1.0)
MONOS PCT: 6 %
Neutro Abs: 2.8 10*3/uL (ref 1.7–7.7)
Neutrophils Relative %: 66 %
PLATELETS: 214 10*3/uL (ref 150–400)
RBC: 4.12 MIL/uL — ABNORMAL LOW (ref 4.22–5.81)
RDW: 12.8 % (ref 11.5–15.5)
WBC: 4.2 10*3/uL (ref 4.0–10.5)

## 2017-09-20 LAB — I-STAT CG4 LACTIC ACID, ED: Lactic Acid, Venous: 1.14 mmol/L (ref 0.5–1.9)

## 2017-09-20 NOTE — ED Triage Notes (Signed)
Pt arrives with his daughter to ED for progression of dementia and has been aggressive with wife and nephew at home. Pt thinks his wife is having affair with nephew and is becoming  More confused per daughter. Pt is alert , seems in no distress.

## 2017-09-21 ENCOUNTER — Emergency Department (HOSPITAL_COMMUNITY): Payer: Medicare Other

## 2017-09-21 ENCOUNTER — Ambulatory Visit: Payer: Medicare Other

## 2017-09-21 ENCOUNTER — Encounter (HOSPITAL_COMMUNITY): Payer: Self-pay | Admitting: Emergency Medicine

## 2017-09-21 DIAGNOSIS — J9811 Atelectasis: Secondary | ICD-10-CM | POA: Diagnosis not present

## 2017-09-21 DIAGNOSIS — G309 Alzheimer's disease, unspecified: Secondary | ICD-10-CM | POA: Diagnosis not present

## 2017-09-21 DIAGNOSIS — R410 Disorientation, unspecified: Secondary | ICD-10-CM | POA: Diagnosis not present

## 2017-09-21 LAB — URINALYSIS, ROUTINE W REFLEX MICROSCOPIC
BILIRUBIN URINE: NEGATIVE
GLUCOSE, UA: NEGATIVE mg/dL
Hgb urine dipstick: NEGATIVE
KETONES UR: NEGATIVE mg/dL
LEUKOCYTES UA: NEGATIVE
NITRITE: NEGATIVE
PH: 6 (ref 5.0–8.0)
PROTEIN: NEGATIVE mg/dL
Specific Gravity, Urine: 1.012 (ref 1.005–1.030)

## 2017-09-21 LAB — I-STAT TROPONIN, ED: Troponin i, poc: 0.01 ng/mL (ref 0.00–0.08)

## 2017-09-21 NOTE — ED Provider Notes (Signed)
[facility] EMERGENCY DEPARTMENT Provider Note   CSN: [id number] Arrival date & time: 09/20/17  1415     History   Chief Complaint Chief Complaint  Patient presents with  . Dementia  . Aggressive Behavior    HPI Allen [name] is a 84 y.o. male.  The history is provided by the patient.  Illness  This is a chronic problem. The current episode started more than 1 week ago. The problem occurs constantly. The problem has been gradually worsening. Pertinent negatives include no chest pain, no abdominal pain, no headaches and no shortness of breath. Nothing aggravates the symptoms. Nothing relieves the symptoms. He has tried nothing for the symptoms. The treatment provided no relief.  Worsening sleeping pattern with his dementia.  Patient's family states they cannot get him to rest and they are seeking placement.    Past Medical History:  Diagnosis Date  . Anemia   . Arthritis    hands and feet  . Chronic hyponatremia 10/18/2013  . 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  To check labs today, consider Aricept (not oriented to date/month/season, is oriented to place ("[place]") and "doctor's office".   . GERD (gastroesophageal reflux disease)   . H/O hiatal hernia   . Hearing loss   . Hyperlipidemia   . Hypertension   . Loss of weight 04/19/2013   04/18/2013: Unintentional weight loss.  Flat affect; some concern for possible depression.  Patient and wife had been very much looking forward to extended trip to their native [place], has been temporarily delayed due to his weight loss and appetite issues.  He has been on Mirtazapine in the past, will try this again at low dose for appetite and depression.  Consider PHQ9 in Spanish at next visit in the coming 1 month.  If family feels he is improving, I believe he would do well to travel to  [place] as this will likely benefit his mood greatly.    . Lumbar radiculopathy, chronic   . Prostatic hypertrophy, benign, with obstruction   . Pterygium eye 01/26/2014   Bilateral pterygium along nasal aspects of both sclerae; not covering pupil but causing patient some discomfort. For referral to ophthalmologist. Gave contact information to Dr Luciana Axe.    Marland Kitchen Restless legs syndrome (RLS)   . Syncope   . TSH elevation   . Visual impairment     Patient Active Problem List   Diagnosis Date Noted  . Visual impairment   . Urinary incontinence, nocturnal enuresis 05/08/2016  . Impaired functional mobility, balance, gait, and endurance 05/07/2016  . Abnormal thyroid stimulating hormone (TSH) level 03/27/2016  . Chronic hyponatremia 10/18/2013  . DAT (dementia of Alzheimer type) without behavioral or psychological symptoms of dementia 12/23/2012  . Risk for falls 12/23/2012  . Urinary incontinence 12/23/2012  . TSH elevation 03/26/2011  . Lumbar radiculopathy, chronic 11/17/2010  . Second degree Mobitz II AV block 03/13/2010  . CONSTIPATION 04/17/2009  . LOSS, HEARING NOS 11/09/2006  . BPH (benign prostatic hyperplasia) 11/09/2006  . HYPERTENSION, BENIGN 10/20/2006  . ALLERGIC RHINITIS, SEASONAL 10/20/2006  . Hyperlipidemia 08/05/2006  . RESTLESS LEGS SYNDROME 08/05/2006  . GASTROESOPHAGEAL REFLUX, NO ESOPHAGITIS 08/05/2006    Past Surgical History:  Procedure Laterality Date  . EP IMPLANTABLE DEVICE N/A 09/03/2015   Procedure:  PPM Generator Changeout; SJM dual chamber ppm by Dr Johney Frame  .  ESOPHAGOGASTRODUODENOSCOPY  01/29/2012   Procedure: ESOPHAGOGASTRODUODENOSCOPY (EGD);  Surgeon: Barrie Folk, MD;  Location: [facility] ENDOSCOPY;  Service: Endoscopy;  Laterality: N/A;  Intrepreter scheduled 930-12.  Marland Kitchen HERNIA REPAIR    . INGUINAL HERNIA REPAIR  1980  . PACEMAKER PLACEMENT  03/2009   syncope - bradycardia, and 2nd degree block   . TESTICLE SURGERY  1933        Home Medications     Prior to Admission medications   Medication Sig Start Date End Date Taking? Authorizing Provider  aspirin EC 81 MG tablet Take 1 tablet (81 mg total) by mouth daily. 09/25/15   Tobey Grim, MD  azelastine (OPTIVAR) 0.05 % ophthalmic solution INSTILL 1 DROP IN BOTH EYES 2 TIMES DAILY 07/20/17   Latrelle Dodrill, MD  b complex vitamins capsule Take 1 capsule by mouth daily.    [provider]  Cholecalciferol 1000 units tablet Take 1 tablet (1,000 Units total) by mouth daily. 05/08/16   McDiarmid, Leighton Roach, MD  finasteride (PROSCAR) 5 MG tablet as directed. Tome 1 tableta por la boca  diario    [provider]  Fish Oil OIL Take 1 capsule by mouth 2 (two) times daily.     [provider]  Multiple Vitamins-Minerals (EYE VITAMINS PO) Take 1 tablet by mouth daily. [facility] [employer], Historical, MD  omeprazole (PRILOSEC) 20 MG capsule Take 1 capsule (20 mg total) by mouth 2 (two) times daily. 04/08/17   Marquette Saa, MD  pravastatin (PRAVACHOL) 10 MG tablet Take 1 tablet (10 mg total) by mouth daily. 06/03/17   Latrelle Dodrill, MD  silodosin (RAPAFLO) 8 MG CAPS capsule as directed. Tome 1 capsula por la boca  diario con el desayuno    [provider]  traZODone (DESYREL) 50 MG tablet Take 0.5 tablets (25 mg total) by mouth at bedtime as needed for sleep. 09/10/17   Latrelle Dodrill, MD    Family History Family History  Problem Relation Age of Onset  . Cancer Daughter   . Heart attack Neg Hx   . Stroke Neg Hx     Social History Social History   Tobacco Use  . Smoking status: Former Games developer  . Smokeless tobacco: Never Used  Substance Use Topics  . Alcohol use: No  . Drug use: No     Allergies   Flomax [tamsulosin hcl]   Review of Systems Review of Systems  Constitutional: Negative for fever.  Respiratory: Negative for shortness of breath.   Cardiovascular: Negative for chest pain.   Gastrointestinal: Negative for abdominal pain, diarrhea and vomiting.  Genitourinary: Negative for dysuria.  Neurological: Negative for headaches.  Psychiatric/Behavioral: Positive for confusion.  All other systems reviewed and are negative.    Physical Exam Updated Vital Signs BP 140/83   Pulse 61   Temp 98.1 F (36.7 C)   Resp 16   SpO2 98%   Physical Exam  Constitutional: He appears well-developed and well-nourished.  HENT:  Head: Normocephalic and atraumatic.  Mouth/Throat: No oropharyngeal exudate.  Eyes: Pupils are equal, round, and reactive to light. Conjunctivae are normal.  Neck: Normal range of motion. Neck supple.  Cardiovascular: Normal rate, regular rhythm, normal heart sounds and intact distal pulses.  Pulmonary/Chest: Effort normal and breath sounds normal. No stridor. He has no wheezes. He has no rales.  Abdominal: Soft. Bowel sounds are normal. He exhibits no mass. There is no tenderness. There is no rebound and no  guarding.  Musculoskeletal: Normal range of motion. He exhibits no tenderness or deformity.  Neurological: He is alert. He displays normal reflexes.  Skin: Skin is warm and dry. Capillary refill takes less than 2 seconds.  Psychiatric: He has a normal mood and affect.     ED Treatments / Results  Labs (all labs ordered are listed, but only abnormal results are displayed) Results for orders placed or performed during the hospital encounter of 09/20/17  Comprehensive metabolic panel  Result Value Ref Range   Sodium 135 135 - 145 mmol/L   Potassium 4.0 3.5 - 5.1 mmol/L   Chloride 103 101 - 111 mmol/L   CO2 23 22 - 32 mmol/L   Glucose, Bld 114 (H) 65 - 99 mg/dL   BUN 10 6 - 20 mg/dL   Creatinine, Ser 1.30 0.61 - 1.24 mg/dL   Calcium 9.5 8.9 - 86.5 mg/dL   Total Protein 7.1 6.5 - 8.1 g/dL   Albumin 3.8 3.5 - 5.0 g/dL   AST 28 15 - 41 U/L   ALT 18 17 - 63 U/L   Alkaline Phosphatase 67 38 - 126 U/L   Total Bilirubin 0.6 0.3 - 1.2 mg/dL    GFR calc non Af Amer 56 (L) >60 mL/min   GFR calc Af Amer >60 >60 mL/min   Anion gap 9 5 - 15  CBC with Differential  Result Value Ref Range   WBC 4.2 4.0 - 10.5 K/uL   RBC 4.12 (L) 4.22 - 5.81 MIL/uL   Hemoglobin 13.8 13.0 - 17.0 g/dL   HCT 78.4 69.6 - 29.5 %   MCV 96.8 78.0 - 100.0 fL   MCH 33.5 26.0 - 34.0 pg   MCHC 34.6 30.0 - 36.0 g/dL   RDW 28.4 13.2 - 44.0 %   Platelets 214 150 - 400 K/uL   Neutrophils Relative % 66 %   Neutro Abs 2.8 1.7 - 7.7 K/uL   Lymphocytes Relative 25 %   Lymphs Abs 1.1 0.7 - 4.0 K/uL   Monocytes Relative 6 %   Monocytes Absolute 0.2 0.1 - 1.0 K/uL   Eosinophils Relative 3 %   Eosinophils Absolute 0.1 0.0 - 0.7 K/uL   Basophils Relative 0 %   Basophils Absolute 0.0 0.0 - 0.1 K/uL  Urinalysis, Routine w reflex microscopic  Result Value Ref Range   Color, Urine AMBER (A) YELLOW   APPearance HAZY (A) CLEAR   Specific Gravity, Urine 1.012 1.005 - 1.030   pH 6.0 5.0 - 8.0   Glucose, UA NEGATIVE NEGATIVE mg/dL   Hgb urine dipstick NEGATIVE NEGATIVE   Bilirubin Urine NEGATIVE NEGATIVE   Ketones, ur NEGATIVE NEGATIVE mg/dL   Protein, ur NEGATIVE NEGATIVE mg/dL   Nitrite NEGATIVE NEGATIVE   Leukocytes, UA NEGATIVE NEGATIVE  I-Stat CG4 Lactic Acid, ED  Result Value Ref Range   Lactic Acid, Venous 1.14 0.5 - 1.9 mmol/L  I-stat troponin, ED  Result Value Ref Range   Troponin i, poc 0.01 0.00 - 0.08 ng/mL   Comment 3           Dg Chest 2 View  Result Date: 09/21/2017 CLINICAL DATA:  Altered mental status.  History of dementia. EXAM: CHEST - 2 VIEW COMPARISON:  Chest radiograph March 10, 2013 FINDINGS: Worsening bibasilar strandy densities. No pleural effusion or focal consolidation. Cardiac silhouette is mildly enlarged and unchanged. Calcified aortic knob. Mild hyperinflation. Dual lead LEFT cardiac pacemaker in situ. No pneumothorax. Osteopenia. IMPRESSION: Worsening bibasilar atelectasis/scarring. Mild cardiomegaly.  Aortic Atherosclerosis  (ICD10-I70.0). Electronically Signed   By: Awilda Metro M.D.   On: 09/21/2017 00:45   Ct Head Wo Contrast  Result Date: 09/21/2017 CLINICAL DATA:  Dementia progression, increased confusion EXAM: CT HEAD WITHOUT CONTRAST TECHNIQUE: Contiguous axial images were obtained from the base of the skull through the vertex without intravenous contrast. COMPARISON:  03/24/2016 FINDINGS: Brain: No acute territorial infarction, hemorrhage or intracranial mass is visualized. Moderate severe atrophy. Moderate small vessel ischemic changes of the white matter. Stable ventricle size. Vascular: No hyperdense vessels.  Carotid vascular calcification Skull: Normal. Negative for fracture or focal lesion. Sinuses/Orbits: No acute finding. Other: None IMPRESSION: 1. No CT evidence for acute intracranial abnormality 2. Moderate severe atrophy, appears slightly progressed since comparison CT. Moderate severe white matter small vessel ischemic changes. Electronically Signed   By: Jasmine Pang M.D.   On: 09/21/2017 00:41    EKG EKG Interpretation  Date/Time:  Tuesday Amali Uhls 16 2019 01:45:47 EDT Ventricular Rate:  60 PR Interval:    QRS Duration: 179 QT Interval:  478 QTC Calculation: 478 R Axis:   -75 Text Interpretation:  Sinus rhythm Short PR interval Nonspecific IVCD with LAD Left ventricular hypertrophy Confirmed by Orville Widmann ([id number]) on 09/21/2017 3:40:22 AM   Radiology Dg Chest 2 View  Result Date: 09/21/2017 CLINICAL DATA:  Altered mental status.  History of dementia. EXAM: CHEST - 2 VIEW COMPARISON:  Chest radiograph March 10, 2013 FINDINGS: Worsening bibasilar strandy densities. No pleural effusion or focal consolidation. Cardiac silhouette is mildly enlarged and unchanged. Calcified aortic knob. Mild hyperinflation. Dual lead LEFT cardiac pacemaker in situ. No pneumothorax. Osteopenia. IMPRESSION: Worsening bibasilar atelectasis/scarring. Mild cardiomegaly. Aortic Atherosclerosis (ICD10-I70.0).  Electronically Signed   By: Awilda Metro M.D.   On: 09/21/2017 00:45   Ct Head Wo Contrast  Result Date: 09/21/2017 CLINICAL DATA:  Dementia progression, increased confusion EXAM: CT HEAD WITHOUT CONTRAST TECHNIQUE: Contiguous axial images were obtained from the base of the skull through the vertex without intravenous contrast. COMPARISON:  03/24/2016 FINDINGS: Brain: No acute territorial infarction, hemorrhage or intracranial mass is visualized. Moderate severe atrophy. Moderate small vessel ischemic changes of the white matter. Stable ventricle size. Vascular: No hyperdense vessels.  Carotid vascular calcification Skull: Normal. Negative for fracture or focal lesion. Sinuses/Orbits: No acute finding. Other: None IMPRESSION: 1. No CT evidence for acute intracranial abnormality 2. Moderate severe atrophy, appears slightly progressed since comparison CT. Moderate severe white matter small vessel ischemic changes. Electronically Signed   By: Jasmine Pang M.D.   On: 09/21/2017 00:41    Procedures Procedures (including critical care time)  Case d/w Dr. Linwood Dibbles of family practice.  Patient's family to be called with an appointment for a face to face encounter to have patient placed in a memory care facility.     Final Clinical Impressions(s) / ED Diagnoses   Final diagnoses:  Alzheimer's disease of other onset without behavioral disturbance   I had a long face to face discussion with the patient's daughter.  The patient has no acute medical illness that would require admission.  She is amenable to the plan of following up with family practice for placement in memory care unit.    Return for weakness, numbness, changes in vision or speech, fevers >100.4 unrelieved by medication, shortness of breath, intractable vomiting, or diarrhea, abdominal pain, Inability to tolerate liquids or food, cough, altered mental status or any concerns. No signs of systemic illness or infection. The patient is  nontoxic-appearing on exam and  vital signs are within normal limits.   I have reviewed the triage vital signs and the nursing notes. Pertinent labs &imaging results that were available during my care of the patient were reviewed by me and considered in my medical decision making (see chart for details).  After history, exam, and medical workup I feel the patient has been appropriately medically screened and is safe for discharge home. Pertinent diagnoses were discussed with the patient. Patient was given return precautions.       Michella Detjen, MD 09/21/17 [phone]

## 2017-09-22 ENCOUNTER — Encounter: Payer: Self-pay | Admitting: Family Medicine

## 2017-09-22 ENCOUNTER — Ambulatory Visit (INDEPENDENT_AMBULATORY_CARE_PROVIDER_SITE_OTHER): Payer: Medicare Other | Admitting: Family Medicine

## 2017-09-22 ENCOUNTER — Other Ambulatory Visit: Payer: Self-pay

## 2017-09-22 ENCOUNTER — Encounter: Payer: Self-pay | Admitting: Licensed Clinical Social Worker

## 2017-09-22 DIAGNOSIS — G301 Alzheimer's disease with late onset: Secondary | ICD-10-CM

## 2017-09-22 DIAGNOSIS — F0281 Dementia in other diseases classified elsewhere with behavioral disturbance: Secondary | ICD-10-CM | POA: Diagnosis not present

## 2017-09-22 MED ORDER — TRAZODONE HCL 50 MG PO TABS: 25.0000 mg | ORAL_TABLET | Freq: Every evening | ORAL | 0 refills | Status: DC | PRN

## 2017-09-22 NOTE — Assessment & Plan Note (Signed)
Worsening likely the cause of his weight loss and behavior changes.  Will review medications and discuss plans with family

## 2017-09-22 NOTE — Patient Instructions (Signed)
  He has an Alzheimer's type dementia  He could take the trazadone 1/2 tab at night if he can't sleep or is agitated  Come back to see me in 1-2 weeks - bring all his medication bottles

## 2017-09-22 NOTE — Progress Notes (Signed)
Subjective  Allen [name] is a 84 y.o. male is presenting with the following  DEMENTIA This has been worsening for months with confusion and refusing to sleep and get angry (no physical violence).  They have not tried trazadone - were concerned he would become addicted.  Had PCS in past but people who came could not speak spanish and caused him to be agitated.  He is slowly losing weight and is not interested in eating.   No focal weakness or syncope   Chief Complaint noted Review of Symptoms - see HPI PMH - Smoking status noted.  Recent ER work up of labs and head CT unrevealing except for brain atrophy  Objective Vital Signs reviewed BP 103/60 (BP Location: Left Arm, Patient Position: Sitting, Cuff Size: Normal)   Pulse 68   Temp 97.6 F (36.4 C) (Oral)   SpO2 95%  Does not respond verbally to me or to his relative Heart - Regular rate and rhythm.  No murmurs, gallops or rubs.    Lungs:  Normal respiratory effort, chest expands symmetrically. Lungs are clear to auscultation, no crackles or wheezes. Bilateral equal grips  Assessments/Plans  See after visit summary for details of patient instuctions  DAT (dementia of Alzheimer type) without behavioral or psychological symptoms of dementia Worsening likely the cause of his weight loss and behavior changes.  Will review medications and discuss plans with family

## 2017-09-22 NOTE — Telephone Encounter (Signed)
Pt's daughter calling regarding todays visit. States she was told to call back if he needed refill of trazodone, states he does not have any left and [facility] Call back number [phone] Shawna OrleansMeredith B Thomsen, RN

## 2017-09-22 NOTE — Progress Notes (Signed)
Type of Service: Clinical Social Work  Social work consult from Dr. Brooke Dare reference patient needing resources for possible placement.   LCSW briefly met with patient and daughter.  Daughter is not interested in facility placement at this time.  Wants patient to remain at home.  Discussed Personal Care Service a referra was completed last year.  Patient is no longer receiving PCS services as the provider did not speak Spanish and was not a good fit for patient.  Daughter wanted information about programs that would pay family members to stay with patient.  LCSW is not aware of a program.  Discussed private pay but this is not an option for patient.   Casimer Lanius, LCSW Licensed Clinical Social Worker [facility]   [phone] 12:00 PM

## 2017-09-25 ENCOUNTER — Other Ambulatory Visit: Payer: Self-pay | Admitting: Family Medicine

## 2017-09-29 ENCOUNTER — Encounter: Payer: Self-pay | Admitting: Family Medicine

## 2017-09-29 ENCOUNTER — Other Ambulatory Visit: Payer: Self-pay

## 2017-09-29 ENCOUNTER — Ambulatory Visit (INDEPENDENT_AMBULATORY_CARE_PROVIDER_SITE_OTHER): Payer: Medicare Other | Admitting: Family Medicine

## 2017-09-29 DIAGNOSIS — F0281 Dementia in other diseases classified elsewhere with behavioral disturbance: Secondary | ICD-10-CM | POA: Diagnosis not present

## 2017-09-29 DIAGNOSIS — N400 Enlarged prostate without lower urinary tract symptoms: Secondary | ICD-10-CM | POA: Diagnosis not present

## 2017-09-29 DIAGNOSIS — G301 Alzheimer's disease with late onset: Secondary | ICD-10-CM | POA: Diagnosis not present

## 2017-09-29 DIAGNOSIS — F02818 Dementia in other diseases classified elsewhere, unspecified severity, with other behavioral disturbance: Secondary | ICD-10-CM

## 2017-09-29 MED ORDER — CHOLECALCIFEROL 25 MCG (1000 UT) PO TABS: 1000.0000 [IU] | ORAL_TABLET | Freq: Every day | ORAL | 99 refills | Status: DC

## 2017-09-29 MED ORDER — SILODOSIN 8 MG PO CAPS: 8.0000 mg | ORAL_CAPSULE | Freq: Every day | ORAL | 0 refills | Status: DC

## 2017-09-29 NOTE — Assessment & Plan Note (Signed)
Behavioral symptoms improving.  Continue trazadone.  We discussed symptoms and prognosis

## 2017-09-29 NOTE — Assessment & Plan Note (Signed)
Seems well controlled  

## 2017-09-29 NOTE — Progress Notes (Signed)
Subjective  Allen [name] is a 84 y.o. male is presenting with the following  DEMENTIA His confusion and refusing to sleep and get angry has improved.  They are using ttrazadone 50 mg most nights and he is sleeping better and less anxious during the day - He continues not to eat very much He is slowly losing weight .   No focal weakness or syncope  His son Allen [name] is thinking about having him come live with him   BPH He is taking both prostate medications.  His son does not think he is having any urinary problems with pain or bleeding or straining or any lightheadness    Chief Complaint noted Review of Symptoms - see HPI PMH - Smoking status noted.    Objective Vital Signs reviewed BP 100/60 (BP Location: Left Arm, Patient Position: Sitting, Cuff Size: Normal)   Pulse 68   Temp (!) 96.5 F (35.8 C) (Axillary)   Ht 5\' 3"  (1.6 m)   Wt 109 lb (49.4 kg)   SpO2 96%   BMI 19.31 kg/m  Alert does not speak to me or his son Cooperative and shakes hands  Assessments/Plans  See after visit summary for details of patient instuctions  BPH (benign prostatic hyperplasia) Seems well controlled  DAT (dementia of Alzheimer type) without behavioral or psychological symptoms of dementia Behavioral symptoms improving.  Continue trazadone.  We discussed symptoms and prognosis

## 2017-09-29 NOTE — Patient Instructions (Signed)
Good to see you today!  Thanks for coming in.  I have refilled his medications  Let me know if changes that are problems of if need refills contact your pharmacy  Come back in 3 -6 months depending

## 2017-10-16 ENCOUNTER — Other Ambulatory Visit: Payer: Self-pay | Admitting: Family Medicine

## 2017-11-09 ENCOUNTER — Ambulatory Visit (INDEPENDENT_AMBULATORY_CARE_PROVIDER_SITE_OTHER): Payer: Medicare Other | Admitting: *Deleted

## 2017-11-09 DIAGNOSIS — I495 Sick sinus syndrome: Secondary | ICD-10-CM | POA: Diagnosis not present

## 2017-11-09 NOTE — Progress Notes (Signed)
Remote pacemaker transmission.   

## 2017-11-14 ENCOUNTER — Other Ambulatory Visit: Payer: Self-pay | Admitting: Internal Medicine

## 2017-11-24 ENCOUNTER — Other Ambulatory Visit: Payer: Self-pay

## 2017-11-24 ENCOUNTER — Ambulatory Visit (INDEPENDENT_AMBULATORY_CARE_PROVIDER_SITE_OTHER): Payer: Medicare Other | Admitting: Family Medicine

## 2017-11-24 ENCOUNTER — Encounter: Payer: Self-pay | Admitting: Family Medicine

## 2017-11-24 DIAGNOSIS — R7989 Other specified abnormal findings of blood chemistry: Secondary | ICD-10-CM | POA: Diagnosis not present

## 2017-11-24 DIAGNOSIS — F0281 Dementia in other diseases classified elsewhere with behavioral disturbance: Secondary | ICD-10-CM | POA: Diagnosis not present

## 2017-11-24 DIAGNOSIS — N400 Enlarged prostate without lower urinary tract symptoms: Secondary | ICD-10-CM | POA: Diagnosis not present

## 2017-11-24 DIAGNOSIS — F02818 Dementia in other diseases classified elsewhere, unspecified severity, with other behavioral disturbance: Secondary | ICD-10-CM

## 2017-11-24 DIAGNOSIS — G301 Alzheimer's disease with late onset: Secondary | ICD-10-CM | POA: Diagnosis not present

## 2017-11-24 MED ORDER — FINASTERIDE 5 MG PO TABS: 5.0000 mg | ORAL_TABLET | Freq: Every day | ORAL | 3 refills | Status: DC

## 2017-11-24 MED ORDER — TRAZODONE HCL 50 MG PO TABS: 50.0000 mg | ORAL_TABLET | Freq: Every evening | ORAL | 2 refills | Status: DC | PRN

## 2017-11-24 MED ORDER — PRAVASTATIN SODIUM 10 MG PO TABS: 10.0000 mg | ORAL_TABLET | Freq: Every day | ORAL | 1 refills | Status: DC

## 2017-11-24 MED ORDER — CHOLECALCIFEROL 25 MCG (1000 UT) PO TABS: 1000.0000 [IU] | ORAL_TABLET | Freq: Every day | ORAL | 3 refills | Status: DC

## 2017-11-24 MED ORDER — SILODOSIN 8 MG PO CAPS: 8.0000 mg | ORAL_CAPSULE | Freq: Every day | ORAL | 2 refills | Status: DC

## 2017-11-24 NOTE — Assessment & Plan Note (Signed)
Stable

## 2017-11-24 NOTE — Patient Instructions (Signed)
Good to see you today!  Thanks for coming in.  For his sleep I would try to shift him so he is in bed for only 7-8 hours and try not to have him sleep during the day  If the sleeping is worsening or he is wandering  We could try another medication   I will call you if your tests are not good.  Otherwise I will send you a letter.  If you do not hear from me with in 2 weeks please call our office.

## 2017-11-24 NOTE — Progress Notes (Signed)
Subjective  Allen [name] [name] is a 84 y.o. male is presenting with the following  DEMENTIA His confusion and refusing to sleep and get angry is about the sasme  They are using ttrazadone 50 mg most nights and he is sleeping better and less anxious during the day - He wakes up several times a night which causes his son to wake up too.  Sometimes wanders.  Goes to bed around 8-9 and gets up for good around 6-7.  He continues not to eat very much   No focal weakness or syncope  He is living with his son Allen [name]  BPH He is taking both prostate medications.  His son does not think he is having any urinary problems with pain or bleeding or straining or any lightheadness   ABNORMAL THYROID TESTs Last TSH was elevated in 2017.  No noted heat or cold intolerance or hair changes   Chief Complaint noted Review of Symptoms - see HPI PMH - Smoking status noted.    Objective Vital Signs reviewed BP 124/80   Pulse 62   Temp 97.7 F (36.5 C) (Oral)   Ht 5\' 3"  (1.6 m)   Wt 108 lb 6.4 oz (49.2 kg)   SpO2 93%   BMI 19.20 kg/m  Alert does not speak Heart - Regular rate and rhythm.  No murmurs, gallops or rubs.    Lungs:  Normal respiratory effort, chest expands symmetrically. Lungs are clear to auscultation, no crackles or wheezes. 1+ edema at sock line  Assessments/Plans  See after visit summary for details of patient instuctions  BPH (benign prostatic hyperplasia) Stable   DAT (dementia of Alzheimer type) without behavioral or psychological symptoms of dementia Stable.  Sleep disturbance - see after visit summary try to shift time to bed and not be in bed for more than 7-8 hours  TSH elevation Recheck labs

## 2017-11-24 NOTE — Assessment & Plan Note (Addendum)
Recheck labs   TSH is more elevated but the T4 is normal. Will continue to monitor

## 2017-11-24 NOTE — Assessment & Plan Note (Signed)
Stable.  Sleep disturbance - see after visit summary try to shift time to bed and not be in bed for more than 7-8 hours

## 2017-11-25 LAB — TSH: TSH: 7.58 u[IU]/mL — AB (ref 0.450–4.500)

## 2017-11-25 LAB — T4, FREE: Free T4: 0.93 ng/dL (ref 0.82–1.77)

## 2017-11-26 ENCOUNTER — Encounter: Payer: Self-pay | Admitting: Family Medicine

## 2017-11-30 LAB — CUP PACEART REMOTE DEVICE CHECK
Battery Remaining Longevity: 124 mo
Battery Remaining Percentage: 95.5 %
Battery Voltage: 3.01 V
Brady Statistic RA Percent Paced: 85 %
Implantable Lead Implant Date: 20101018
Implantable Lead Location: 753859
Implantable Lead Location: 753860
Implantable Lead Model: 5076
Implantable Pulse Generator Implant Date: 20170328
Lead Channel Impedance Value: 450 Ohm
Lead Channel Impedance Value: 640 Ohm
Lead Channel Pacing Threshold Amplitude: 0.625 V
Lead Channel Pacing Threshold Amplitude: 0.75 V
Lead Channel Pacing Threshold Pulse Width: 0.4 ms
Lead Channel Sensing Intrinsic Amplitude: 4.2 mV
Lead Channel Setting Pacing Pulse Width: 0.4 ms
MDC IDC LEAD IMPLANT DT: 20101018
MDC IDC LEAD MODEL: 246367
MDC IDC LEAD SERIAL: 28583747
MDC IDC MSMT LEADCHNL RV PACING THRESHOLD PULSEWIDTH: 0.4 ms
MDC IDC MSMT LEADCHNL RV SENSING INTR AMPL: 5.2 mV
MDC IDC PG SERIAL: 7893011
MDC IDC SESS DTM: 20190604060021
MDC IDC SET LEADCHNL RA PACING AMPLITUDE: 1.75 V
MDC IDC SET LEADCHNL RV PACING AMPLITUDE: 0.875
MDC IDC SET LEADCHNL RV SENSING SENSITIVITY: 1.5 mV
MDC IDC STAT BRADY AP VP PERCENT: 85 %
MDC IDC STAT BRADY AP VS PERCENT: 1 %
MDC IDC STAT BRADY AS VP PERCENT: 14 %
MDC IDC STAT BRADY AS VS PERCENT: 1 %
MDC IDC STAT BRADY RV PERCENT PACED: 99 %

## 2017-12-02 NOTE — Assessment & Plan Note (Signed)
.  cd

## 2018-01-18 ENCOUNTER — Other Ambulatory Visit: Payer: Self-pay | Admitting: Family Medicine

## 2018-02-08 ENCOUNTER — Ambulatory Visit (INDEPENDENT_AMBULATORY_CARE_PROVIDER_SITE_OTHER): Payer: Medicare Other | Admitting: *Deleted

## 2018-02-08 DIAGNOSIS — I495 Sick sinus syndrome: Secondary | ICD-10-CM | POA: Diagnosis not present

## 2018-02-08 NOTE — Progress Notes (Signed)
Remote pacemaker transmission.   

## 2018-02-11 ENCOUNTER — Other Ambulatory Visit: Payer: Self-pay | Admitting: Family Medicine

## 2018-03-07 LAB — CUP PACEART REMOTE DEVICE CHECK
Battery Remaining Longevity: 124 mo
Brady Statistic AP VP Percent: 85 %
Brady Statistic AP VS Percent: 1 %
Brady Statistic AS VP Percent: 13 %
Brady Statistic RA Percent Paced: 84 %
Brady Statistic RV Percent Paced: 98 %
Implantable Lead Implant Date: 20101018
Implantable Lead Location: 753859
Implantable Lead Model: 246367
Implantable Lead Model: 5076
Lead Channel Impedance Value: 480 Ohm
Lead Channel Impedance Value: 690 Ohm
Lead Channel Pacing Threshold Amplitude: 0.625 V
Lead Channel Pacing Threshold Pulse Width: 0.4 ms
Lead Channel Sensing Intrinsic Amplitude: 5.2 mV
Lead Channel Setting Pacing Amplitude: 0.875
Lead Channel Setting Pacing Amplitude: 2 V
Lead Channel Setting Pacing Pulse Width: 0.4 ms
MDC IDC LEAD IMPLANT DT: 20101018
MDC IDC LEAD LOCATION: 753860
MDC IDC LEAD SERIAL: 28583747
MDC IDC MSMT BATTERY REMAINING PERCENTAGE: 95.5 %
MDC IDC MSMT BATTERY VOLTAGE: 3.01 V
MDC IDC MSMT LEADCHNL RA PACING THRESHOLD AMPLITUDE: 1 V
MDC IDC MSMT LEADCHNL RA PACING THRESHOLD PULSEWIDTH: 0.4 ms
MDC IDC MSMT LEADCHNL RA SENSING INTR AMPL: 3.2 mV
MDC IDC PG IMPLANT DT: 20170328
MDC IDC PG SERIAL: 7893011
MDC IDC SESS DTM: 20190903060015
MDC IDC SET LEADCHNL RV SENSING SENSITIVITY: 1.5 mV
MDC IDC STAT BRADY AS VS PERCENT: 1 %

## 2018-03-09 ENCOUNTER — Other Ambulatory Visit: Payer: Self-pay

## 2018-03-09 ENCOUNTER — Encounter: Payer: Self-pay | Admitting: Family Medicine

## 2018-03-09 ENCOUNTER — Ambulatory Visit (INDEPENDENT_AMBULATORY_CARE_PROVIDER_SITE_OTHER): Payer: Medicare Other | Admitting: Family Medicine

## 2018-03-09 DIAGNOSIS — F0281 Dementia in other diseases classified elsewhere with behavioral disturbance: Secondary | ICD-10-CM

## 2018-03-09 DIAGNOSIS — H918X3 Other specified hearing loss, bilateral: Secondary | ICD-10-CM

## 2018-03-09 DIAGNOSIS — Z23 Encounter for immunization: Secondary | ICD-10-CM

## 2018-03-09 DIAGNOSIS — R7989 Other specified abnormal findings of blood chemistry: Secondary | ICD-10-CM | POA: Diagnosis not present

## 2018-03-09 DIAGNOSIS — N400 Enlarged prostate without lower urinary tract symptoms: Secondary | ICD-10-CM

## 2018-03-09 DIAGNOSIS — F02818 Dementia in other diseases classified elsewhere, unspecified severity, with other behavioral disturbance: Secondary | ICD-10-CM

## 2018-03-09 DIAGNOSIS — E785 Hyperlipidemia, unspecified: Secondary | ICD-10-CM | POA: Diagnosis not present

## 2018-03-09 DIAGNOSIS — G301 Alzheimer's disease with late onset: Secondary | ICD-10-CM

## 2018-03-09 NOTE — Assessment & Plan Note (Signed)
Stable

## 2018-03-09 NOTE — Patient Instructions (Signed)
Good to see you today!  Thanks for coming in.  I would stop the pravastatin and aspirin  Come back in 6 months or if he has any pain

## 2018-03-09 NOTE — Assessment & Plan Note (Signed)
Family wishes referral to audiology to see if this can be improved

## 2018-03-09 NOTE — Assessment & Plan Note (Signed)
Subacute hypothyroidism by last labs.  Perhaps recheck in 6 months

## 2018-03-09 NOTE — Assessment & Plan Note (Signed)
Discussed with son today and consensus is not to push life prolonging treatments and to mainly aim to comfort. Therefore will stop statin and asa

## 2018-03-09 NOTE — Addendum Note (Signed)
Addended by: Pearlean Brownie L on: 03/09/2018 03:38 PM   Modules accepted: Orders

## 2018-03-09 NOTE — Assessment & Plan Note (Signed)
Worsening slowly.  Not many disruptive behaviors.  Discussed with son today and consensus is not to push life prolonging treatments and to mainly aim to comfort. Therefore will stop statin and asa

## 2018-03-09 NOTE — Progress Notes (Signed)
Subjective  Allen [name] is a 84 y.o. male is presenting with the following   DEMENTIA His confusion and refusing to sleep waxes and wanes  They are using trazadone 50 mg most nights and he is sleeping better and less anxious during the day. Sometimes wanders.  He does not seem to be in pain   He continues not to eat very much   No focal weakness or syncope  He is living with his son Allen [name]  BPH He is taking both prostate medications.  Sometimes he seems to want to urinate but then does not.  His son does not think he is having any urinary problems with pain or bleeding or straining or any lightheadness   ABNORMAL THYROID TESTs Last TSH was elevated.   His T4 was normal.  No signs of heat intolerance or unexpected weight loss   Chief Complaint noted Review of Symptoms - see HPI PMH - Smoking status noted.    Objective Vital Signs reviewed BP 118/72   Pulse 63   Temp 97.8 F (36.6 C) (Oral)   Ht 5\' 3"  (1.6 m)   Wt 111 lb 9.6 oz (50.6 kg)   SpO2 96%   BMI 19.77 kg/m   Sits and rocks and occaisionally makes drumming motions on chair Heart - Regular rate and rhythm.  No murmurs, gallops or rubs.    Lungs:  Normal respiratory effort, chest expands symmetrically. Lungs are clear to auscultation, no crackles or wheezes. Extremities:  No cyanosis, edema, or deformity noted with good range of motion of all major joints.    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  Not many disruptive behaviors.  Discussed with son today and consensus is not to push life prolonging treatments and to mainly aim to comfort. Therefore will stop statin and asa   Hyperlipidemia Discussed with son today and consensus is not to push life prolonging treatments and to mainly aim to comfort. Therefore will  stop statin and asa   TSH elevation Subacute hypothyroidism by last labs.  Perhaps recheck in 6 months

## 2018-04-06 ENCOUNTER — Encounter (HOSPITAL_COMMUNITY): Payer: Self-pay | Admitting: Emergency Medicine

## 2018-04-06 ENCOUNTER — Emergency Department (HOSPITAL_COMMUNITY): Payer: Medicare Other

## 2018-04-06 ENCOUNTER — Emergency Department (HOSPITAL_COMMUNITY)
Admission: EM | Admit: 2018-04-06 | Discharge: 2018-04-07 | Disposition: A | Discharge status: Home / Self Care | Payer: Medicare Other | Attending: Emergency Medicine | Admitting: Emergency Medicine

## 2018-04-06 DIAGNOSIS — Z87891 Personal history of nicotine dependence: Secondary | ICD-10-CM | POA: Diagnosis not present

## 2018-04-06 DIAGNOSIS — R112 Nausea with vomiting, unspecified: Secondary | ICD-10-CM | POA: Diagnosis not present

## 2018-04-06 DIAGNOSIS — F0390 Unspecified dementia without behavioral disturbance: Secondary | ICD-10-CM | POA: Insufficient documentation

## 2018-04-06 DIAGNOSIS — Z79899 Other long term (current) drug therapy: Secondary | ICD-10-CM | POA: Diagnosis not present

## 2018-04-06 DIAGNOSIS — Z7982 Long term (current) use of aspirin: Secondary | ICD-10-CM | POA: Diagnosis not present

## 2018-04-06 DIAGNOSIS — R110 Nausea: Secondary | ICD-10-CM | POA: Diagnosis not present

## 2018-04-06 DIAGNOSIS — I10 Essential (primary) hypertension: Secondary | ICD-10-CM | POA: Insufficient documentation

## 2018-04-06 DIAGNOSIS — J9811 Atelectasis: Secondary | ICD-10-CM | POA: Diagnosis not present

## 2018-04-06 DIAGNOSIS — R531 Weakness: Secondary | ICD-10-CM | POA: Insufficient documentation

## 2018-04-06 DIAGNOSIS — R42 Dizziness and giddiness: Secondary | ICD-10-CM | POA: Diagnosis not present

## 2018-04-06 LAB — URINALYSIS, ROUTINE W REFLEX MICROSCOPIC
Bilirubin Urine: NEGATIVE
GLUCOSE, UA: NEGATIVE mg/dL
Hgb urine dipstick: NEGATIVE
Ketones, ur: NEGATIVE mg/dL
LEUKOCYTES UA: NEGATIVE
Nitrite: NEGATIVE
PH: 7 (ref 5.0–8.0)
Protein, ur: NEGATIVE mg/dL
SPECIFIC GRAVITY, URINE: 1.004 — AB (ref 1.005–1.030)

## 2018-04-06 LAB — HEPATIC FUNCTION PANEL
ALT: 14 U/L (ref 0–44)
AST: 39 U/L (ref 15–41)
Albumin: 3.5 g/dL (ref 3.5–5.0)
Alkaline Phosphatase: 71 U/L (ref 38–126)
Bilirubin, Direct: 0.2 mg/dL (ref 0.0–0.2)
Indirect Bilirubin: 0.5 mg/dL (ref 0.3–0.9)
Total Bilirubin: 0.7 mg/dL (ref 0.3–1.2)
Total Protein: 6.7 g/dL (ref 6.5–8.1)

## 2018-04-06 LAB — I-STAT CG4 LACTIC ACID, ED: LACTIC ACID, VENOUS: 2.19 mmol/L — AB (ref 0.5–1.9)

## 2018-04-06 LAB — BASIC METABOLIC PANEL
Anion gap: 5 (ref 5–15)
BUN: 8 mg/dL (ref 8–23)
CHLORIDE: 99 mmol/L (ref 98–111)
CO2: 27 mmol/L (ref 22–32)
CREATININE: 1.06 mg/dL (ref 0.61–1.24)
Calcium: 9.3 mg/dL (ref 8.9–10.3)
GFR calc non Af Amer: 57 mL/min — ABNORMAL LOW (ref >60)
Glucose, Bld: 93 mg/dL (ref 70–99)
Potassium: 3.7 mmol/L (ref 3.5–5.1)
Sodium: 131 mmol/L — ABNORMAL LOW (ref 135–145)

## 2018-04-06 LAB — CBG MONITORING, ED: Glucose-Capillary: 99 mg/dL (ref 70–99)

## 2018-04-06 LAB — CBC
HEMATOCRIT: 40.7 % (ref 39.0–52.0)
HEMOGLOBIN: 13.2 g/dL (ref 13.0–17.0)
MCH: 32 pg (ref 26.0–34.0)
MCHC: 32.4 g/dL (ref 30.0–36.0)
MCV: 98.5 fL (ref 80.0–100.0)
NRBC: 0 % (ref 0.0–0.2)
Platelets: 219 10*3/uL (ref 150–400)
RBC: 4.13 MIL/uL — AB (ref 4.22–5.81)
RDW: 12.1 % (ref 11.5–15.5)
WBC: 5.2 10*3/uL (ref 4.0–10.5)

## 2018-04-06 LAB — I-STAT TROPONIN, ED: Troponin i, poc: 0 ng/mL (ref 0.00–0.08)

## 2018-04-06 MED ORDER — LACTATED RINGERS IV BOLUS
500.0000 mL | Freq: Once | INTRAVENOUS | Status: AC
  Administered 2018-04-06: 500 mL via INTRAVENOUS

## 2018-04-06 NOTE — ED Triage Notes (Signed)
Pt arrived [facility] from home with c/o nausea and dizziness since 8pm. Has not vomited.  Vitals with EMS BP132/74 P70 paced RR18 O2% 96 CBG 138 20GLFA

## 2018-04-06 NOTE — ED Notes (Signed)
Family now at bedside and adds that patient has been less responsive, also has hx of memory issues and HOH, speaks spanish only

## 2018-04-06 NOTE — ED Provider Notes (Signed)
[facility] EMERGENCY DEPARTMENT Provider Note   CSN: [id number] Arrival date & time: 04/06/18  2045     History   Chief Complaint Chief Complaint  Patient presents with  . Dizziness  . Nausea    HPI Allen [name] is a 84 y.o. male.  The history is provided by the patient and a caregiver.  Near Syncope  This is a new problem. The current episode started 3 to 5 hours ago. The problem occurs rarely. The problem has been resolved. Pertinent negatives include no chest pain, no abdominal pain, no headaches and no shortness of breath. Nothing aggravates the symptoms. Nothing relieves the symptoms. He has tried nothing for the symptoms. The treatment provided no relief.    Past Medical History:  Diagnosis Date  . Anemia   . Arthritis    hands and feet  . Chronic hyponatremia 10/18/2013  . 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  To check labs today, consider Aricept (not oriented to date/month/season, is oriented to place ("") and "doctor's office".   . GERD (gastroesophageal reflux disease)   . H/O hiatal hernia   . Hearing loss   . Hyperlipidemia   . Hypertension   . Loss of weight 04/19/2013   04/18/2013: Unintentional weight loss.  Flat affect; some concern for possible depression.  Patient and wife had been very much looking forward to extended trip to their native [place], has been temporarily delayed due to his weight loss and appetite issues.  He has been on Mirtazapine in the past, will try this again at low dose for appetite and depression.  Consider PHQ9 in Spanish at next visit in the coming 1 month.  If family feels he is improving, I believe he would do well to travel to [place] as this will likely benefit his mood greatly.    . Lumbar radiculopathy, chronic   . Prostatic hypertrophy, benign, with obstruction   .  Pterygium eye 01/26/2014   Bilateral pterygium along nasal aspects of both sclerae; not covering pupil but causing patient some discomfort. For referral to ophthalmologist. Gave contact information to Dr Luciana Axe.    Marland Kitchen Restless legs syndrome (RLS)   . Syncope   . TSH elevation   . Visual impairment     Patient Active Problem List   Diagnosis Date Noted  . Visual impairment   . Urinary incontinence, nocturnal enuresis 05/08/2016  . Impaired functional mobility, balance, gait, and endurance 05/07/2016  . DAT (dementia of Alzheimer type) without behavioral or psychological symptoms of dementia 12/23/2012  . TSH elevation 03/26/2011  . Second degree Mobitz II AV block 03/13/2010  . Hearing loss 11/09/2006  . BPH (benign prostatic hyperplasia) 11/09/2006  . ALLERGIC RHINITIS, SEASONAL 10/20/2006  . Hyperlipidemia 08/05/2006  . GASTROESOPHAGEAL REFLUX, NO ESOPHAGITIS 08/05/2006    Past Surgical History:  Procedure Laterality Date  . EP IMPLANTABLE DEVICE N/A 09/03/2015   Procedure:  PPM Generator Changeout; SJM dual chamber ppm by Dr Johney Frame  . ESOPHAGOGASTRODUODENOSCOPY  01/29/2012   Procedure: ESOPHAGOGASTRODUODENOSCOPY (EGD);  Surgeon: Barrie Folk, MD;  Location: [facility] ENDOSCOPY;  Service: Endoscopy;  Laterality: N/A;  Intrepreter scheduled 930-12.  Marland Kitchen HERNIA REPAIR    . INGUINAL HERNIA REPAIR  1980  . PACEMAKER PLACEMENT  03/2009   syncope - bradycardia, and 2nd degree block   . TESTICLE SURGERY  1933  Home Medications    Prior to Admission medications   Medication Sig Start Date End Date Taking? Authorizing Provider  aspirin EC 81 MG tablet Take 81 mg by mouth daily.   Yes [provider]  azelastine (OPTIVAR) 0.05 % ophthalmic solution INSTILL 1 DROP IN BOTH EYES 2 TIMES DAILY Patient taking differently: Place 1 drop into both eyes 2 (two) times daily.  01/19/18  Yes Latrelle Dodrill, MD  b complex vitamins capsule Take 1 capsule by mouth daily.   Yes [provider]  Cholecalciferol 1000 units tablet Take 1 tablet (1,000 Units total) by mouth daily. 11/24/17  Yes Carney Living, MD  finasteride (PROSCAR) 5 MG tablet Take 1 tablet (5 mg total) by mouth daily. Tome 1 tableta por la boca  diario Patient taking differently: Take 5 mg by mouth daily.  11/24/17  Yes Chambliss, Estill Batten, MD  Fish Oil OIL Take 1 capsule by mouth 2 (two) times daily.    Yes [provider]  Multiple Vitamins-Minerals (EYE VITAMINS PO) Take 1 tablet by mouth daily. [employer]   Yes [provider]  omeprazole (PRILOSEC) 20 MG capsule TAKE 1 CAPSULE BY MOUTH TWICE A DAY Patient taking differently: Take 20 mg by mouth daily.  02/11/18  Yes Carney Living, MD  pravastatin (PRAVACHOL) 10 MG tablet Take 10 mg by mouth daily. 03/26/18  Yes [provider]  silodosin (RAPAFLO) 8 MG CAPS capsule Take 1 capsule (8 mg total) by mouth daily with breakfast. Tome 1 capsula por la boca  diario con el desayuno Patient taking differently: Take 8 mg by mouth daily with breakfast.  11/24/17  Yes Chambliss, Estill Batten, MD  traZODone (DESYREL) 50 MG tablet Take 1 tablet (50 mg total) by mouth at bedtime as needed for sleep. 11/24/17  Yes Chambliss, Estill Batten, MD    Family History Family History  Problem Relation Age of Onset  . Cancer Daughter   . Heart attack Neg Hx   . Stroke Neg Hx     Social History Social History   Tobacco Use  . Smoking status: Former Games developer  . Smokeless tobacco: Never Used  Substance Use Topics  . Alcohol use: No  . Drug use: No     Allergies   Flomax [tamsulosin hcl]   Review of Systems Review of Systems  Constitutional: Negative for chills and fever.  HENT: Negative for ear pain and sore throat.   Eyes: Negative for pain and visual disturbance.  Respiratory: Negative for cough and shortness of breath.   Cardiovascular: Positive for near-syncope. Negative for chest pain and palpitations.   Gastrointestinal: Negative for abdominal pain and vomiting.  Genitourinary: Negative for dysuria and hematuria.  Musculoskeletal: Negative for arthralgias and back pain.  Skin: Negative for color change and rash.  Neurological: Positive for dizziness, weakness and light-headedness. Negative for seizures, syncope and headaches.  All other systems reviewed and are negative.    Physical Exam Updated Vital Signs  ED Triage Vitals  Enc Vitals Group     BP 04/06/18 2051 139/71     Pulse Rate 04/06/18 2051 63     Resp 04/06/18 2051 (!) 6     Temp 04/06/18 2051 97.6 F (36.4 C)     Temp Source 04/06/18 2051 Oral     SpO2 04/06/18 2051 98 %     Weight --      Height --      Head Circumference --  Peak Flow --      Pain Score 04/06/18 2052 0     Pain Loc --      Pain Edu? --      Excl. in GC? --     Physical Exam  Constitutional: He is oriented to person, place, and time. He appears well-developed and well-nourished.  HENT:  Head: Normocephalic and atraumatic.  Eyes: Pupils are equal, round, and reactive to light. Conjunctivae and EOM are normal.  Neck: Normal range of motion. Neck supple.  Cardiovascular: Normal rate, regular rhythm, normal heart sounds and intact distal pulses.  No murmur heard. Pulmonary/Chest: Effort normal and breath sounds normal. No respiratory distress.  Abdominal: Soft. There is no tenderness.  Musculoskeletal: Normal range of motion. He exhibits no edema.  Neurological: He is alert and oriented to person, place, and time. No cranial nerve deficit or sensory deficit. He exhibits normal muscle tone. Coordination normal.  5+/5 strength, normal sensation, normal finger to nose finger, no drift  Skin: Skin is warm and dry.  Psychiatric: He has a normal mood and affect.  Nursing note and vitals reviewed.    ED Treatments / Results  Labs (all labs ordered are listed, but only abnormal results are displayed) Labs Reviewed  BASIC METABOLIC PANEL -  Abnormal; Notable for the following components:      Result Value   Sodium 131 (*)    GFR calc non Af Amer 57 (*)    All other components within normal limits  CBC - Abnormal; Notable for the following components:   RBC 4.13 (*)    All other components within normal limits  URINALYSIS, ROUTINE W REFLEX MICROSCOPIC - Abnormal; Notable for the following components:   Color, Urine STRAW (*)    Specific Gravity, Urine 1.004 (*)    All other components within normal limits  I-STAT CG4 LACTIC ACID, ED - Abnormal; Notable for the following components:   Lactic Acid, Venous 2.19 (*)    All other components within normal limits  HEPATIC FUNCTION PANEL  CBG MONITORING, ED  I-STAT TROPONIN, ED  I-STAT CG4 LACTIC ACID, ED    EKG EKG Interpretation  Date/Time:  Wednesday April 06 2018 22:20:21 EDT Ventricular Rate:  65 PR Interval:    QRS Duration: 169 QT Interval:  499 QTC Calculation: 519 R Axis:   -79 Text Interpretation:  Junctional rhythm Left bundle branch block No significant change was found when compared to older EKGs Confirmed by Virgina Norfolk [phone]) on 04/06/2018 10:24:08 PM   Radiology Ct Head Wo Contrast  Result Date: 04/06/2018 CLINICAL DATA:  Nausea and dizziness tonight with vomiting. EXAM: CT HEAD WITHOUT CONTRAST TECHNIQUE: Contiguous axial images were obtained from the base of the skull through the vertex without intravenous contrast. COMPARISON:  09/21/2017 FINDINGS: Brain: Ventricles, cisterns and other CSF spaces are unchanged and compatible with age related atrophy. There is chronic ischemic microvascular disease. There is no mass, mass effect, shift of midline structures or acute hemorrhage. No evidence of acute infarction. Vascular: No hyperdense vessel or unexpected calcification. Skull: Normal. Negative for fracture or focal lesion. Sinuses/Orbits: No acute finding. Other: None. IMPRESSION: No acute findings. Chronic ischemic microvascular disease and age  related atrophy. Electronically Signed   By: Elberta Fortis M.D.   On: 04/06/2018 23:17   Dg Chest Portable 1 View  Result Date: 04/06/2018 CLINICAL DATA:  Nausea and dizziness. EXAM: PORTABLE CHEST 1 VIEW COMPARISON:  09/21/2017 FINDINGS: Unchanged bibasilar scarring/atelectasis. Moderate cardiomegaly. Left chest wall pacemaker leads  are unchanged. No focal airspace consolidation or pulmonary edema. No pleural effusion or pneumothorax. IMPRESSION: Unchanged bibasilar atelectasis. Electronically Signed   By: Deatra Robinson M.D.   On: 04/06/2018 22:38    Procedures Procedures (including critical care time)  Medications Ordered in ED Medications  lactated ringers bolus 500 mL (0 mLs Intravenous Stopped 04/06/18 2342)     Initial Impression / Assessment and Plan / ED Course  I have reviewed the triage vital signs and the nursing notes.  Pertinent labs & imaging results that were available during my care of the patient were reviewed by me and considered in my medical decision making (see chart for details).     Allen [name] is a 84 year old male with history of hypertension, BPH, dementia who presents to the ED with fatigue, near syncope earlier tonight.  Patient with normal vitals.  No fever.  Patient is overall asymptomatic at this time.  Patient lives at home with his family who are his primary caregivers.  Patient has no complaints upon my evaluation.  Family states that patient has been mildly weaker over the last several days.  Has not had a change in his appetite.  No infectious symptoms.  Patient with normal neurological exam.  No abdominal tenderness on exam.  Overall is well-appearing.  Patient denies any chest pain, shortness of breath.  EKG shows junctional rhythm.  Unchanged from prior EKGs.  Troponin within normal limits.  Doubt cardiac process.  Patient has a pacemaker that was recently evaluated and according the family has no issues with that.  Patient appeared to  get dizzy and lightheaded when he stood up after using the bathroom today.  He was using his walker and family was able to help him to the floor.  He did not hit his head.  Suspect possible orthostatic syncope.  Patient had no signs of urinary tract infection.  No pneumonia, pneumothorax, pleural effusion on chest x-ray.  Head CT was unremarkable.  No significant electrolyte abnormality, kidney injury, leukocytosis, anemia.  Lactic acid was mildly elevated.  Patient was given 500 cc of fluid and lactic acidosis improved.  Patient was overall asymptomatic throughout my care.  Suspect patient likely with orthostatic symptoms today.  Recommend increase hydration at home.  Recommend follow-up with primary care doctor patient. No concern for stroke or cardiac process at this time.  Family prefers to take patient home at this time and they will follow-up with primary care doctor.  They are happy with taking care of the patient and do not want him to be placed.  Discharged in good condition, given return precautions.   This chart was dictated using voice recognition software.  Despite best efforts to proofread,  errors can occur which can change the documentation meaning.   Final Clinical Impressions(s) / ED Diagnoses   Final diagnoses:  Weakness    ED Discharge Orders    None       Virgina Norfolk, DO 04/07/18 0036

## 2018-04-06 NOTE — ED Notes (Signed)
This Clinical research associate and Aleksa NT attempted to check rectal temp and in and out pt. Pt refusing, kicking and pushing [place] away despite explanation.

## 2018-04-06 NOTE — ED Notes (Signed)
Patient transported to CT 

## 2018-04-06 NOTE — ED Notes (Signed)
Pt and family understand he needs to provide UA when possible. Provided with urinal; verbalized understanding.

## 2018-04-07 DIAGNOSIS — R531 Weakness: Secondary | ICD-10-CM | POA: Diagnosis not present

## 2018-04-07 LAB — I-STAT CG4 LACTIC ACID, ED: LACTIC ACID, VENOUS: 1.47 mmol/L (ref 0.5–1.9)

## 2018-04-19 ENCOUNTER — Ambulatory Visit (INDEPENDENT_AMBULATORY_CARE_PROVIDER_SITE_OTHER): Payer: Medicare Other | Admitting: Family Medicine

## 2018-04-19 ENCOUNTER — Other Ambulatory Visit: Payer: Self-pay

## 2018-04-19 ENCOUNTER — Encounter: Payer: Self-pay | Admitting: Family Medicine

## 2018-04-19 DIAGNOSIS — I441 Atrioventricular block, second degree: Secondary | ICD-10-CM | POA: Diagnosis not present

## 2018-04-19 DIAGNOSIS — R55 Syncope and collapse: Secondary | ICD-10-CM | POA: Diagnosis not present

## 2018-04-19 DIAGNOSIS — R7989 Other specified abnormal findings of blood chemistry: Secondary | ICD-10-CM | POA: Diagnosis not present

## 2018-04-19 DIAGNOSIS — E039 Hypothyroidism, unspecified: Secondary | ICD-10-CM | POA: Insufficient documentation

## 2018-04-19 NOTE — Progress Notes (Signed)
Subjective  Allen [name] is a 84 y.o. male is presenting with the following  NEAR SYNCOPE One episode about 2 weeks ago when became very faint and weak had to sit down on floor but no full LOC.  No evident pain or shortness of breath.  Seen in ER with full blood workup ECG and CT.  (I reviewed)  Has been acting normally since.  Has a pacer and is always in junctional rhythm.    HYPOTHYROID Recent TSH was elevated with normal T4.  He has no specific symptoms of low thyroid - unusual fatigue or rapid weight loss or gain or temp intolerance. Is persistently bradycardic.  Chief Complaint noted Review of Symptoms - see HPI PMH - Smoking status noted.    Objective Vital Signs reviewed BP (!) 158/70   Pulse 65   Temp 97.7 F (36.5 C) (Oral)   Ht 5\' 3"  (1.6 m)   Wt 115 lb (52.2 kg)   SpO2 97%   BMI 20.37 kg/m  Alert Does not respond verbally to me but does to his son Heart -slow rate normal rhythm.  No murmurs, gallops or rubs.    Lungs:  Normal respiratory effort, chest expands symmetrically. Lungs are clear to auscultation, no crackles or wheezes. Extremities:  No cyanosis, edema, or deformity noted with good range of motion of all major joints.     Assessments/Plans  See after visit summary for details of patient instuctions  Hypothyroid Seems to be subclinical.   May consider treating if could possibly increase heart rate?   Near syncope Seems to have been an isolated episode.  Wu in ER was normal.   Son would like to know if could be related to his pacer and so would like a cardiology evaluation.  We discussed that this would not improve his progressive dementia

## 2018-04-19 NOTE — Patient Instructions (Signed)
Good to see you today!  Thanks for coming in.  I will refer you to cardiology.  If you do not hear from them in 2 weeks  I will call you if your tests are not good.  Otherwise I will send you a letter.  If you do not hear from me with in 2 weeks please call our office.

## 2018-04-19 NOTE — Assessment & Plan Note (Addendum)
Seems to be subclinical.   May consider treating if could possibly increase heart rate?

## 2018-04-19 NOTE — Assessment & Plan Note (Signed)
Seems to have been an isolated episode.  Wu in ER was normal.   Son would like to know if could be related to his pacer and so would like a cardiology evaluation.  We discussed that this would not improve his progressive dementia

## 2018-04-20 ENCOUNTER — Other Ambulatory Visit: Payer: Self-pay | Admitting: Family Medicine

## 2018-04-20 ENCOUNTER — Encounter: Payer: Self-pay | Admitting: Family Medicine

## 2018-04-20 LAB — TSH: TSH: 6.07 u[IU]/mL — ABNORMAL HIGH (ref 0.450–4.500)

## 2018-04-20 LAB — T4, FREE: Free T4: 0.89 ng/dL (ref 0.82–1.77)

## 2018-05-10 ENCOUNTER — Ambulatory Visit (INDEPENDENT_AMBULATORY_CARE_PROVIDER_SITE_OTHER): Payer: Medicare Other

## 2018-05-10 DIAGNOSIS — I495 Sick sinus syndrome: Secondary | ICD-10-CM

## 2018-05-10 NOTE — Progress Notes (Signed)
Remote pacemaker transmission.   

## 2018-05-26 ENCOUNTER — Encounter: Payer: Self-pay | Admitting: Internal Medicine

## 2018-06-22 ENCOUNTER — Other Ambulatory Visit: Payer: Self-pay | Admitting: Family Medicine

## 2018-06-23 NOTE — Addendum Note (Signed)
Addended by: Latrelle Dodrill on: 06/23/2018 04:52 PM   Modules accepted: Orders

## 2018-06-30 ENCOUNTER — Encounter: Payer: Self-pay | Admitting: Internal Medicine

## 2018-06-30 LAB — CUP PACEART REMOTE DEVICE CHECK
Battery Remaining Longevity: 126 mo
Brady Statistic AP VS Percent: 1 %
Brady Statistic AS VP Percent: 14 %
Brady Statistic AS VS Percent: 1 %
Brady Statistic RA Percent Paced: 84 %
Implantable Lead Implant Date: 20101018
Implantable Lead Location: 753859
Implantable Lead Model: 246367
Implantable Lead Model: 5076
Lead Channel Impedance Value: 480 Ohm
Lead Channel Pacing Threshold Pulse Width: 0.4 ms
Lead Channel Pacing Threshold Pulse Width: 0.4 ms
Lead Channel Sensing Intrinsic Amplitude: 4.2 mV
Lead Channel Sensing Intrinsic Amplitude: 7 mV
Lead Channel Setting Pacing Amplitude: 2.125
MDC IDC LEAD IMPLANT DT: 20101018
MDC IDC LEAD LOCATION: 753860
MDC IDC LEAD SERIAL: 28583747
MDC IDC MSMT BATTERY REMAINING PERCENTAGE: 95.5 %
MDC IDC MSMT BATTERY VOLTAGE: 3.01 V
MDC IDC MSMT LEADCHNL RA PACING THRESHOLD AMPLITUDE: 1.125 V
MDC IDC MSMT LEADCHNL RV IMPEDANCE VALUE: 700 Ohm
MDC IDC MSMT LEADCHNL RV PACING THRESHOLD AMPLITUDE: 0.5 V
MDC IDC PG IMPLANT DT: 20170328
MDC IDC PG SERIAL: 7893011
MDC IDC SESS DTM: 20191203070012
MDC IDC SET LEADCHNL RV PACING AMPLITUDE: 0.75 V
MDC IDC SET LEADCHNL RV PACING PULSEWIDTH: 0.4 ms
MDC IDC SET LEADCHNL RV SENSING SENSITIVITY: 1.5 mV
MDC IDC STAT BRADY AP VP PERCENT: 85 %
MDC IDC STAT BRADY RV PERCENT PACED: 98 %

## 2018-07-20 ENCOUNTER — Encounter: Payer: Self-pay | Admitting: Internal Medicine

## 2018-07-20 ENCOUNTER — Encounter (INDEPENDENT_AMBULATORY_CARE_PROVIDER_SITE_OTHER): Payer: Self-pay

## 2018-07-20 ENCOUNTER — Ambulatory Visit (INDEPENDENT_AMBULATORY_CARE_PROVIDER_SITE_OTHER): Payer: Medicare Other | Admitting: Internal Medicine

## 2018-07-20 VITALS — BP 104/66 | HR 66 | Ht 62.0 in | Wt 116.4 lb

## 2018-07-20 DIAGNOSIS — Z950 Presence of cardiac pacemaker: Secondary | ICD-10-CM | POA: Diagnosis not present

## 2018-07-20 DIAGNOSIS — I495 Sick sinus syndrome: Secondary | ICD-10-CM | POA: Diagnosis not present

## 2018-07-20 DIAGNOSIS — I10 Essential (primary) hypertension: Secondary | ICD-10-CM

## 2018-07-20 NOTE — Progress Notes (Signed)
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 male who presents today for routine electrophysiology followup. He has advanced dementia.  No concerns from family.  Today, he denies symptoms of palpitations, chest pain, shortness of breath,  lower extremity edema,  or syncope.  The patient is otherwise without complaint today.   Past Medical History:  Diagnosis Date  . Anemia   . Arthritis    hands and feet  . Chronic hyponatremia 10/18/2013  . 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  To check labs today, consider Aricept (not oriented to date/month/season, is oriented to place ("[place]") and "doctor's office".   . GERD (gastroesophageal reflux disease)   . H/O hiatal hernia   . Hearing loss   . Hyperlipidemia   . Hypertension   . Loss of weight 04/19/2013   04/18/2013: Unintentional weight loss.  Flat affect; some concern for possible depression.  Patient and wife had been very much looking forward to extended trip to their native [place], has been temporarily delayed due to his weight loss and appetite issues.  He has been on Mirtazapine in the past, will try this again at low dose for appetite and depression.  Consider PHQ9 in Spanish at next visit in the coming 1 month.  If family feels he is improving, I believe he would do well to travel to [place] as this will likely benefit his mood greatly.    . Lumbar radiculopathy, chronic   . Prostatic hypertrophy, benign, with obstruction   . Pterygium eye 01/26/2014   Bilateral pterygium along nasal aspects of both sclerae; not covering pupil but causing patient some discomfort. For referral to ophthalmologist. Gave contact information to Dr Luciana Axe.    Marland Kitchen Restless legs syndrome (RLS)   . Syncope   . TSH elevation   . Visual impairment    Past Surgical History:  Procedure  Laterality Date  . EP IMPLANTABLE DEVICE N/A 09/03/2015   Procedure:  PPM Generator Changeout; SJM dual chamber ppm by Dr Johney Frame  . ESOPHAGOGASTRODUODENOSCOPY  01/29/2012   Procedure: ESOPHAGOGASTRODUODENOSCOPY (EGD);  Surgeon: Barrie Folk, MD;  Location: [facility] ENDOSCOPY;  Service: Endoscopy;  Laterality: N/A;  Intrepreter scheduled 930-12.  Marland Kitchen HERNIA REPAIR    . INGUINAL HERNIA REPAIR  1980  . PACEMAKER PLACEMENT  03/2009   syncope - bradycardia, and 2nd degree block   . TESTICLE SURGERY  1933    ROS- all systems are reviewed and negative except as per HPI above  Current Outpatient Medications  Medication Sig Dispense Refill  . aspirin EC 81 MG tablet Take 81 mg by mouth daily.    Marland Kitchen azelastine (OPTIVAR) 0.05 % ophthalmic solution INSTILL 1 DROP IN BOTH EYES 2 TIMES DAILY 18 mL 0  . b complex vitamins capsule Take 1 capsule by mouth daily.    . Cholecalciferol 1000 units tablet Take 1 tablet (1,000 Units total) by mouth daily. 100 tablet 3  . finasteride (PROSCAR) 5 MG tablet Take 1 tablet (5 mg total) by mouth daily. Tome 1 tableta por la boca  diario (Patient taking differently: Take 5 mg by mouth daily. ) 90 tablet 3  . Fish Oil OIL Take 1 capsule by mouth 2 (two) times daily.     . Multiple Vitamins-Minerals (EYE VITAMINS PO) Take 1 tablet by mouth daily.  [employer]    . omeprazole (PRILOSEC) 20 MG capsule TAKE 1 CAPSULE BY MOUTH TWICE A DAY (Patient taking differently: Take 20 mg by mouth daily. ) 180 capsule 2  . silodosin (RAPAFLO) 8 MG CAPS capsule Take 1 capsule (8 mg total) by mouth daily with breakfast. Tome 1 capsula por la boca  diario con el desayuno (Patient taking differently: Take 8 mg by mouth daily with breakfast. ) 90 capsule 2  . traZODone (DESYREL) 50 MG tablet Take 1 tablet (50 mg total) by mouth at bedtime as needed for sleep. 90 tablet 2   No current facility-administered medications for this visit.     Physical Exam: Vitals:   07/20/18 1144  BP:  104/66  Pulse: 66  SpO2: 97%  Weight: 116 lb 6.4 oz (52.8 kg)  Height: 5\' 2"  (1.575 m)    GEN- The patient is elderly appearing, alert but confused Head- normocephalic, atraumatic Eyes-  Sclera clear, conjunctiva pink Ears- hearing intact Oropharynx- clear Lungs- Clear to ausculation bilaterally, normal work of breathing Chest- pacemaker pocket is well healed Heart- Regular rate and rhythm, no murmurs, rubs or gallops, PMI not laterally displaced GI- soft, NT, ND, + BS Extremities- no clubbing, cyanosis, or edema  Pacemaker interrogation- reviewed in detail today,  See PACEART report   Assessment and Plan:  1. Symptomatic second degree heart block Normal pacemaker function See Pace Art report No changes today  2. HTN Stable No change required today  Remote monitoring with Merlin Return to see EP NP every year   Hillis RangeJames Keanon Bevins MD, [facility] 07/20/2018 12:08 PM

## 2018-07-20 NOTE — Patient Instructions (Addendum)
Medication Instructions:  Your physician recommends that you continue on your current medications as directed. Please refer to the Current Medication list given to you today.  Labwork: None ordered.  Testing/Procedures: None ordered.  Follow-Up: Your physician wants you to follow-up in: one year with Allen Balsam, NP.   You will receive a reminder letter in the mail two months in advance. If you don't receive a letter, please call our office to schedule the follow-up appointment.  Remote monitoring is used to monitor your Pacemaker from home. This monitoring reduces the number of office visits required to check your device to one time per year. It allows [place] to keep an eye on the functioning of your device to ensure it is working properly. You are scheduled for a device check from home on 08/09/2018. You may send your transmission at any time that day. If you have a wireless device, the transmission will be sent automatically. After your physician reviews your transmission, you will receive a postcard with your next transmission date.  Any Other Special Instructions Will Be Listed Below (If Applicable).  If you need a refill on your cardiac medications before your next appointment, please call your pharmacy.

## 2018-07-21 ENCOUNTER — Ambulatory Visit (INDEPENDENT_AMBULATORY_CARE_PROVIDER_SITE_OTHER): Payer: Medicare Other | Admitting: Family Medicine

## 2018-07-21 DIAGNOSIS — G301 Alzheimer's disease with late onset: Secondary | ICD-10-CM | POA: Diagnosis present

## 2018-07-21 DIAGNOSIS — F0280 Dementia in other diseases classified elsewhere without behavioral disturbance: Secondary | ICD-10-CM

## 2018-07-21 DIAGNOSIS — G4700 Insomnia, unspecified: Secondary | ICD-10-CM | POA: Diagnosis not present

## 2018-07-21 NOTE — Patient Instructions (Signed)
Take tylenol 1 tablet before bedtime See if this helps him sleep  If not, can go up to 1.5 tablets (75mg  total) of the trazodone  Ok to not give the multivitamin, fish oil, and vitamin D  I will message his cardiologist about the aspirin and let you know what I hear.  Be well, Dr. Pollie Meyer

## 2018-07-21 NOTE — Progress Notes (Signed)
Date of Visit: 07/21/2018   HPI:  Patient presents to discuss poor appetite and insomnia.  He is accompanied by his son Alecia Lemming and daughter Cristela Felt.  Poor appetite: For the last couple of weeks patient has not been wanting to eat much.  Family offers him a variety of foods but he simply does not eat them.  They are able to get some Ensure into him during the day.  Patient has advanced dementia. Son and daughter report that this is worsening.  He now no longer knows who his family members are.  He does still talk, but does not recognize people he should know.  No aggression or wandering.  No safety concerns.  Poor sleep: Has also recently had a lot of trouble sleeping.  He lays in the bed and does not fall asleep.  He does not get up and wander.  No signs of aggression.  They just want him to get some rest.  He takes trazodone 50 mg at bedtime.  This helped for some time, but they feel like it is not helping as much now.  Son has noted that recently while he has been walking he limps a little bit on the right leg as if his right side is hurting.  When patient is asked if he has any pain he denies this both to son at home and to me in the office.  ROS: See HPI.  PMFSH: History of hyperlipidemia, Mobitz 2 AV block, GERD, BPH, Alzheimer's dementia  PHYSICAL EXAM: BP 118/75   Pulse 65   Temp 97.7 F (36.5 C) (Oral)   Wt 118 lb (53.5 kg)   SpO2 96%   BMI 21.58 kg/m  Gen: no acute distress, pleasant, cooperative HEENT: normocephalic, atraumatic  Neuro: alert, does not talk much Ext: no bony tenderness over entirety of R leg and hip, no swelling or warmth  ASSESSMENT/PLAN:  DAT (dementia of Alzheimer type) without behavioral or psychological symptoms of dementia Worsening.  Suspect poor sleep and poor appetite are manifestations of his advanced dementia.  Children have reasonable expectations regarding the progression of dementia.  Advised patient does not want to eat, they do not need to  force it.    We reviewed his medication list today and elected that he could stop taking vitamin D, fish oil, and a multivitamin.  We will keep the focus on comfort.  Continue omeprazole and prostate medications as these likely contribute to his comfort.  I will message his cardiologist about whether he can stop his aspirin.  Insomnia Likely manifestation of dementia.  As son has noted patient favoring the right leg, it is possible that pain is keeping him awake.  We will schedule Tylenol before bedtime to see if this helps.  If it does not help, have advised son that he can increase trazodone to 75 mg daily before bed.  Follow-up if not improving.  FOLLOW UP: Follow up as needed if symptoms worsen or fail to improve.    [place] J. Pollie Meyer, MD [facility]

## 2018-07-21 NOTE — Assessment & Plan Note (Signed)
Likely manifestation of dementia.  As son has noted patient favoring the right leg, it is possible that pain is keeping him awake.  We will schedule Tylenol before bedtime to see if this helps.  If it does not help, have advised son that he can increase trazodone to 75 mg daily before bed.  Follow-up if not improving.

## 2018-07-21 NOTE — Assessment & Plan Note (Addendum)
Worsening.  Suspect poor sleep and poor appetite are manifestations of his advanced dementia.  Children have reasonable expectations regarding the progression of dementia.  Advised patient does not want to eat, they do not need to force it.    We reviewed his medication list today and elected that he could stop taking vitamin D, fish oil, and a multivitamin.  We will keep the focus on comfort.  Continue omeprazole and prostate medications as these likely contribute to his comfort.  I will message his cardiologist about whether he can stop his aspirin.

## 2018-07-22 ENCOUNTER — Encounter: Payer: Self-pay | Admitting: Family Medicine

## 2018-07-26 LAB — CUP PACEART INCLINIC DEVICE CHECK
Brady Statistic RV Percent Paced: 100 %
Date Time Interrogation Session: 20200212180839
Implantable Lead Implant Date: 20101018
Implantable Lead Serial Number: 28583747
Implantable Pulse Generator Implant Date: 20170328
Lead Channel Pacing Threshold Amplitude: 0.625 V
Lead Channel Pacing Threshold Pulse Width: 0.4 ms
Lead Channel Pacing Threshold Pulse Width: 0.4 ms
Lead Channel Sensing Intrinsic Amplitude: 5 mV
Lead Channel Setting Sensing Sensitivity: 0.7 mV
MDC IDC LEAD IMPLANT DT: 20101018
MDC IDC LEAD LOCATION: 753859
MDC IDC LEAD LOCATION: 753860
MDC IDC LEAD MODEL: 246367
MDC IDC MSMT BATTERY REMAINING LONGEVITY: 118 mo
MDC IDC MSMT BATTERY VOLTAGE: 3.01 V
MDC IDC MSMT LEADCHNL RA PACING THRESHOLD AMPLITUDE: 1.25 V
MDC IDC MSMT LEADCHNL RA SENSING INTR AMPL: 2.1 mV
MDC IDC PG SERIAL: 7893011
MDC IDC SET LEADCHNL RA PACING AMPLITUDE: 2.25 V
MDC IDC SET LEADCHNL RV PACING AMPLITUDE: 0.875
MDC IDC SET LEADCHNL RV PACING PULSEWIDTH: 0.4 ms
MDC IDC STAT BRADY RA PERCENT PACED: 87 %
Pulse Gen Model: 2240

## 2018-08-09 ENCOUNTER — Ambulatory Visit (INDEPENDENT_AMBULATORY_CARE_PROVIDER_SITE_OTHER): Payer: Medicare Other | Admitting: *Deleted

## 2018-08-09 DIAGNOSIS — I495 Sick sinus syndrome: Secondary | ICD-10-CM | POA: Diagnosis not present

## 2018-08-09 DIAGNOSIS — I10 Essential (primary) hypertension: Secondary | ICD-10-CM

## 2018-08-09 LAB — CUP PACEART REMOTE DEVICE CHECK
Battery Remaining Longevity: 126 mo
Battery Remaining Percentage: 95.5 %
Battery Voltage: 3.01 V
Brady Statistic AP VS Percent: 1 %
Brady Statistic AS VS Percent: 1 %
Brady Statistic RV Percent Paced: 99 %
Implantable Lead Implant Date: 20101018
Implantable Lead Location: 753859
Implantable Lead Location: 753860
Implantable Lead Model: 246367
Implantable Lead Serial Number: 28583747
Implantable Pulse Generator Implant Date: 20170328
Lead Channel Impedance Value: 460 Ohm
Lead Channel Pacing Threshold Amplitude: 0.625 V
Lead Channel Pacing Threshold Amplitude: 1 V
Lead Channel Pacing Threshold Pulse Width: 0.4 ms
Lead Channel Pacing Threshold Pulse Width: 0.4 ms
Lead Channel Sensing Intrinsic Amplitude: 4.2 mV
Lead Channel Setting Pacing Amplitude: 0.875
Lead Channel Setting Pacing Pulse Width: 0.4 ms
MDC IDC LEAD IMPLANT DT: 20101018
MDC IDC MSMT LEADCHNL RV IMPEDANCE VALUE: 690 Ohm
MDC IDC MSMT LEADCHNL RV SENSING INTR AMPL: 4.5 mV
MDC IDC PG SERIAL: 7893011
MDC IDC SESS DTM: 20200303070031
MDC IDC SET LEADCHNL RA PACING AMPLITUDE: 2 V
MDC IDC SET LEADCHNL RV SENSING SENSITIVITY: 0.7 mV
MDC IDC STAT BRADY AP VP PERCENT: 88 %
MDC IDC STAT BRADY AS VP PERCENT: 12 %
MDC IDC STAT BRADY RA PERCENT PACED: 88 %

## 2018-08-17 NOTE — Progress Notes (Signed)
Remote pacemaker transmission.   

## 2018-09-15 ENCOUNTER — Other Ambulatory Visit: Payer: Self-pay | Admitting: Family Medicine

## 2018-09-22 ENCOUNTER — Emergency Department (HOSPITAL_COMMUNITY)
Admission: EM | Admit: 2018-09-22 | Discharge: 2018-09-23 | Disposition: A | Discharge status: Home / Self Care | Payer: Medicare Other | Attending: Emergency Medicine | Admitting: Emergency Medicine

## 2018-09-22 ENCOUNTER — Emergency Department (HOSPITAL_COMMUNITY): Payer: Medicare Other

## 2018-09-22 ENCOUNTER — Encounter (HOSPITAL_COMMUNITY): Payer: Self-pay | Admitting: Emergency Medicine

## 2018-09-22 ENCOUNTER — Other Ambulatory Visit: Payer: Self-pay

## 2018-09-22 DIAGNOSIS — R55 Syncope and collapse: Secondary | ICD-10-CM | POA: Diagnosis present

## 2018-09-22 DIAGNOSIS — I10 Essential (primary) hypertension: Secondary | ICD-10-CM | POA: Diagnosis not present

## 2018-09-22 DIAGNOSIS — Z87891 Personal history of nicotine dependence: Secondary | ICD-10-CM | POA: Diagnosis not present

## 2018-09-22 DIAGNOSIS — Z79899 Other long term (current) drug therapy: Secondary | ICD-10-CM | POA: Diagnosis not present

## 2018-09-22 DIAGNOSIS — E039 Hypothyroidism, unspecified: Secondary | ICD-10-CM | POA: Diagnosis not present

## 2018-09-22 DIAGNOSIS — Z7982 Long term (current) use of aspirin: Secondary | ICD-10-CM | POA: Diagnosis not present

## 2018-09-22 DIAGNOSIS — G309 Alzheimer's disease, unspecified: Secondary | ICD-10-CM | POA: Insufficient documentation

## 2018-09-22 LAB — CBC WITH DIFFERENTIAL/PLATELET
Abs Immature Granulocytes: 0.03 10*3/uL (ref 0.00–0.07)
Basophils Absolute: 0 10*3/uL (ref 0.0–0.1)
Basophils Relative: 0 %
Eosinophils Absolute: 0.2 10*3/uL (ref 0.0–0.5)
Eosinophils Relative: 5 %
HCT: 42.9 % (ref 39.0–52.0)
Hemoglobin: 14.4 g/dL (ref 13.0–17.0)
Immature Granulocytes: 1 %
Lymphocytes Relative: 22 %
Lymphs Abs: 1.1 10*3/uL (ref 0.7–4.0)
MCH: 32.8 pg (ref 26.0–34.0)
MCHC: 33.6 g/dL (ref 30.0–36.0)
MCV: 97.7 fL (ref 80.0–100.0)
Monocytes Absolute: 0.4 10*3/uL (ref 0.1–1.0)
Monocytes Relative: 8 %
Neutro Abs: 3.1 10*3/uL (ref 1.7–7.7)
Neutrophils Relative %: 64 %
Platelets: 227 10*3/uL (ref 150–400)
RBC: 4.39 MIL/uL (ref 4.22–5.81)
RDW: 11.9 % (ref 11.5–15.5)
WBC: 4.8 10*3/uL (ref 4.0–10.5)
nRBC: 0 % (ref 0.0–0.2)

## 2018-09-22 LAB — COMPREHENSIVE METABOLIC PANEL
ALT: 14 U/L (ref 0–44)
AST: 25 U/L (ref 15–41)
Albumin: 3.9 g/dL (ref 3.5–5.0)
Alkaline Phosphatase: 84 U/L (ref 38–126)
Anion gap: 11 (ref 5–15)
BUN: 9 mg/dL (ref 8–23)
CO2: 26 mmol/L (ref 22–32)
Calcium: 9.9 mg/dL (ref 8.9–10.3)
Chloride: 99 mmol/L (ref 98–111)
Creatinine, Ser: 1.06 mg/dL (ref 0.61–1.24)
GFR calc Af Amer: >60 mL/min (ref >60)
GFR calc non Af Amer: 59 mL/min — ABNORMAL LOW (ref >60)
Glucose, Bld: 106 mg/dL — ABNORMAL HIGH (ref 70–99)
Potassium: 3.9 mmol/L (ref 3.5–5.1)
Sodium: 136 mmol/L (ref 135–145)
Total Bilirubin: 0.6 mg/dL (ref 0.3–1.2)
Total Protein: 7.6 g/dL (ref 6.5–8.1)

## 2018-09-22 LAB — PROTIME-INR
INR: 1.1 (ref 0.8–1.2)
Prothrombin Time: 13.9 seconds (ref 11.4–15.2)

## 2018-09-22 LAB — CBG MONITORING, ED: Glucose-Capillary: 106 mg/dL — ABNORMAL HIGH (ref 70–99)

## 2018-09-22 LAB — TROPONIN I: Troponin I: <0.03 ng/mL (ref <0.03)

## 2018-09-22 NOTE — ED Provider Notes (Addendum)
[facility] EMERGENCY DEPARTMENT Provider Note   CSN: [id number] Arrival date & time: 09/22/18  2138    History   Chief Complaint No chief complaint on file.   HPI Allen [name] is a 85 y.o. male.     85 year old male with prior medical history as detailed below presents for evaluation following reported syncopal event.  Patient arrives by EMS.  Patient was at home.  Patient's family noted brief syncope while the patient was attempting a bowel movement.  Patient slumped forward.  He did not strike his head.  He was briefly unconscious (less than 5 minutes) and then rapidly regained his normal mental status.  No witnessed seizure activity.  Patient without recent fever, cough, chest pain, or shortness of breath. Family does report mildly decreased PO intake over the last 2-3 days.    Patient does have dementia.  He is very hard of hearing.  He only speaks [place].  Patient was unable to hear the ED's translator. History was obtained from EMS and from the patient's power of attorney - Ralph Leyden [phone]. He is full code.   The history is provided by the patient and medical records. The history is limited by a language barrier, the condition of the patient and the absence of a caregiver. A language interpreter was used.  Loss of Consciousness  Episode history:  Single Most recent episode:  Today Duration:  5 minutes Timing:  Rare Progression:  Resolved Chronicity:  New Context: bowel movement   Witnessed: yes   Relieved by:  Nothing Worsened by:  Nothing Ineffective treatments:  None tried Associated symptoms: no chest pain, no shortness of breath and no vomiting     Past Medical History:  Diagnosis Date  . Anemia   . Arthritis    hands and feet  . Chronic hyponatremia 10/18/2013  . DAT (dementia of Alzheimer type) without behavioral or psychological symptoms of dementia 12/23/2012   Reported by daughter over past 3 months or so  (December 23, 2012).  To check labs today, consider Aricept (not oriented to date/month/season, is oriented to place ("[place]") and "doctor's office".   . GERD (gastroesophageal reflux disease)   . H/O hiatal hernia   . Hearing loss   . Hyperlipidemia   . Hypertension   . Loss of weight 04/19/2013   04/18/2013: Unintentional weight loss.  Flat affect; some concern for possible depression.  Patient and wife had been very much looking forward to extended trip to their native [place], has been temporarily delayed due to his weight loss and appetite issues.  He has been on Mirtazapine in the past, will try this again at low dose for appetite and depression.  Consider PHQ9 in Spanish at next visit in the coming 1 month.  If family feels he is improving, I believe he would do well to travel to [place] as this will likely benefit his mood greatly.    . Lumbar radiculopathy, chronic   . Prostatic hypertrophy, benign, with obstruction   . Pterygium eye 01/26/2014   Bilateral pterygium along nasal aspects of both sclerae; not covering pupil but causing patient some discomfort. For referral to ophthalmologist. Gave contact information to Dr Luciana Axe.    Marland Kitchen Restless legs syndrome (RLS)   . Syncope   . TSH elevation   . Visual impairment     Patient Active Problem List   Diagnosis Date Noted  . Insomnia 07/21/2018  . Hypothyroid 04/19/2018  . Near syncope 04/19/2018  .  Visual impairment   . Urinary incontinence, nocturnal enuresis 05/08/2016  . Impaired functional mobility, balance, gait, and endurance 05/07/2016  . DAT (dementia of Alzheimer type) without behavioral or psychological symptoms of dementia 12/23/2012  . TSH elevation 03/26/2011  . Second degree Mobitz II AV block 03/13/2010  . Hearing loss 11/09/2006  . BPH (benign prostatic hyperplasia) 11/09/2006  . ALLERGIC RHINITIS, SEASONAL 10/20/2006  . Hyperlipidemia 08/05/2006  . GASTROESOPHAGEAL REFLUX, NO ESOPHAGITIS 08/05/2006    Past Surgical  History:  Procedure Laterality Date  . EP IMPLANTABLE DEVICE N/A 09/03/2015   Procedure:  PPM Generator Changeout; SJM dual chamber ppm by Dr Johney Frame  . ESOPHAGOGASTRODUODENOSCOPY  01/29/2012   Procedure: ESOPHAGOGASTRODUODENOSCOPY (EGD);  Surgeon: Barrie Folk, MD;  Location: [facility] ENDOSCOPY;  Service: Endoscopy;  Laterality: N/A;  Intrepreter scheduled 930-12.  Marland Kitchen HERNIA REPAIR    . INGUINAL HERNIA REPAIR  1980  . PACEMAKER PLACEMENT  03/2009   syncope - bradycardia, and 2nd degree block   . TESTICLE SURGERY  1933        Home Medications    Prior to Admission medications   Medication Sig Start Date End Date Taking? Authorizing Provider  aspirin EC 81 MG tablet Take 81 mg by mouth daily.    [provider]  azelastine (OPTIVAR) 0.05 % ophthalmic solution INSTILL 1 DROP IN BOTH EYES 2 TIMES DAILY 04/21/18   Latrelle Dodrill, MD  Cholecalciferol 1000 units tablet Take 1 tablet (1,000 Units total) by mouth daily. 11/24/17   Carney Living, MD  finasteride (PROSCAR) 5 MG tablet Take 1 tablet (5 mg total) by mouth daily. Tome 1 tableta por la boca  diario Patient taking differently: Take 5 mg by mouth daily.  11/24/17   Carney Living, MD  Fish Oil OIL Take 1 capsule by mouth 2 (two) times daily.     [provider]  Multiple Vitamins-Minerals (EYE VITAMINS PO) Take 1 tablet by mouth daily. [facility] [employer], Historical, MD  omeprazole (PRILOSEC) 20 MG capsule TAKE 1 CAPSULE BY MOUTH TWICE A DAY Patient taking differently: Take 20 mg by mouth daily.  02/11/18   Carney Living, MD  silodosin (RAPAFLO) 8 MG CAPS capsule TAKE 1 CAPSULE BY MOUTH DAILY WITH BREAKFAST TOME 1 CAPSULA POR LA BOCA DIARIO CON EL DESAYUNO 09/15/18   Latrelle Dodrill, MD  traZODone (DESYREL) 50 MG tablet Take 1 tablet (50 mg total) by mouth at bedtime as needed for sleep. 11/24/17   Carney Living, MD    Family History Family History  Problem Relation  Age of Onset  . Cancer Daughter   . Heart attack Neg Hx   . Stroke Neg Hx     Social History Social History   Tobacco Use  . Smoking status: Former Games developer  . Smokeless tobacco: Never Used  Substance Use Topics  . Alcohol use: No  . Drug use: No     Allergies   Flomax [tamsulosin hcl]   Review of Systems Review of Systems  Unable to perform ROS: Dementia  Respiratory: Negative for shortness of breath.   Cardiovascular: Positive for syncope. Negative for chest pain.  Gastrointestinal: Negative for vomiting.     Physical Exam Updated Vital Signs BP 134/68   Pulse 63   Temp (!) 97.4 F (36.3 C) (Axillary)   Resp 12   Ht  (1.575 m)   Wt 53.5 kg   SpO2 95%   BMI 21.57 kg/m  Physical Exam Vitals signs and nursing note reviewed.  Constitutional:      General: He is not in acute distress.    Appearance: He is well-developed.  HENT:     Head: Normocephalic and atraumatic.     Nose: Nose normal.     Mouth/Throat:     Mouth: Mucous membranes are moist.  Eyes:     Conjunctiva/sclera: Conjunctivae normal.     Pupils: Pupils are equal, round, and reactive to light.  Neck:     Musculoskeletal: Normal range of motion and neck supple.  Cardiovascular:     Rate and Rhythm: Normal rate and regular rhythm.     Heart sounds: Normal heart sounds.  Pulmonary:     Effort: Pulmonary effort is normal. No respiratory distress.     Breath sounds: Normal breath sounds.  Abdominal:     General: There is no distension.     Palpations: Abdomen is soft.     Tenderness: There is no abdominal tenderness.  Musculoskeletal: Normal range of motion.        General: No deformity.  Skin:    General: Skin is warm and dry.  Neurological:     Mental Status: He is alert. Mental status is at baseline.      ED Treatments / Results  Labs (all labs ordered are listed, but only abnormal results are displayed) Labs Reviewed  CBG MONITORING, ED - Abnormal; Notable for the  following components:      Result Value   Glucose-Capillary 106 (*)    All other components within normal limits  CBC WITH DIFFERENTIAL/PLATELET  COMPREHENSIVE METABOLIC PANEL  PROTIME-INR  TROPONIN I    EKG EKG Interpretation  Date/Time:  Thursday September 22 2018 21:55:06 EDT Ventricular Rate:  60 PR Interval:    QRS Duration: 173 QT Interval:  510 QTC Calculation: 510 R Axis:   -80 Text Interpretation:  AV dissociation Nonspecific IVCD with LAD LVH with secondary repolarization abnormality Inferior infarct, acute (RCA) Anterior infarct, old Probable RV involvement, suggest recording right precordial leads Artifact in lead(s) I II aVR aVL aVF Confirmed by Kristine RoyalMessick,  [phone]([id number]) on 09/22/2018 10:01:06 PM   Radiology Dg Chest Port 1 View  Result Date: 09/22/2018 CLINICAL DATA:  Syncopal episode while having bowel movement EXAM: PORTABLE CHEST 1 VIEW COMPARISON:  04/06/2018 FINDINGS: Cardiac shadow is stable. Two lead pacing device is noted. Aortic calcifications are seen. The lungs are well aerated bilaterally with mild right basilar atelectasis stable from the previous exam. No new focal abnormality is seen. IMPRESSION: Stable right basilar atelectasis. Electronically Signed   By: Alcide CleverMark  Lukens M.D.   On: 09/22/2018 23:24    Procedures Procedures (including critical care time)  Medications Ordered in ED Medications - No data to display   Initial Impression / Assessment and Plan / ED Course  I have reviewed the triage vital signs and the nursing notes.  Pertinent labs & imaging results that were available during my care of the patient were reviewed by me and considered in my medical decision making (see chart for details).        MDM  Screen complete  Patient is presenting for evaluation following reported syncope event. Patient is now at his baseline.   Reported history is suggestive of likely vasovagal syncope related to BM.   Screening labs are without significant  abnormality.   Chest Xray is without acute abnormality.   Pacemaker interrogation showed no arrhythmias.   Patient's daughter has now come to the  ED.  Admission was offered for further observation.  After discussion with the patient's daughter at bedside it seems appropriate for the patient to be discharged home.  Family is concerned that the patient will catch COVID by being in the hospital.  He is at his baseline.  He appears to be comfortable.  Work-up in the ED did not reveal significant abnormality.  His reported history is consistent with likely vasovagal syncope or near syncope.  Importance of close follow-up is stressed.  Strict return precautions given and understood.   Final Clinical Impressions(s) / ED Diagnoses   Final diagnoses:  Vasovagal syncope    ED Discharge Orders    None       Wynetta Fines, MD 09/23/18 0000    Wynetta Fines, MD 09/23/18 0005

## 2018-09-22 NOTE — Discharge Instructions (Addendum)
Please return for any problem.  Follow-up with your regular care provider as instructed. °

## 2018-09-22 NOTE — ED Triage Notes (Signed)
Pt from home, family reports pt had a syncopal episode while having a BM on the toilet, pt c/o nausea and leg pain right before, family found him leaving against the bathroom railings so pt did not hit his head. Family helped pt walk to living room. Pt has dementia and is at his baseline, pt denying any complaints. Family reports diminished appetite x 1 week. Sick sinus demand pacemaker but has been continuous with EMS.

## 2018-09-22 NOTE — ED Notes (Addendum)
Pt family member now at bedside. Discussed with CN Tori d/t pt's hx of dementia, language barrier, hearing difficulties, and pt pulling at lines, wires.

## 2018-09-23 ENCOUNTER — Telehealth: Payer: Self-pay | Admitting: Family Medicine

## 2018-09-23 ENCOUNTER — Other Ambulatory Visit: Payer: Self-pay

## 2018-09-23 ENCOUNTER — Ambulatory Visit (INDEPENDENT_AMBULATORY_CARE_PROVIDER_SITE_OTHER): Payer: Medicare Other | Admitting: Family Medicine

## 2018-09-23 DIAGNOSIS — R55 Syncope and collapse: Secondary | ICD-10-CM

## 2018-09-23 NOTE — ED Notes (Signed)
Reviewed d/c instructions with pt's family, who verbalized understanding and had no outstanding questions. Family member present signed for d/c instructions. [employer] & pt labels removed and disposed of in shred bin. Pt departed in NAD.

## 2018-09-23 NOTE — Progress Notes (Signed)
Orthostatics Flat- 98/50 95% 62bpm Sitting- 110/60 94% 73bpm Standing- 100/54 95% 69bpm

## 2018-09-23 NOTE — Patient Instructions (Addendum)
Thank you for coming to see me today. It was a pleasure! Today we talked about:   For this fainting episode it is likely due to blood pressure being low.  He is not currently on any medications that could be causing this.  The silodosin may cause him to feel a little lightheaded however it is important that he does not strain when he is going to the bathroom so we will continue this medication.  It is important that someone tries to stay with him and that you try to get him to drink as much water as possible.  You may also try letting him wear compression stockings in order to help with his symptoms. If this worsens or continues, please call the office.   Please follow-up as needed.  If you have any questions or concerns, please do not hesitate to call the office at [phone].  Take Care,   [place] Faraaz Wolin, DO

## 2018-09-23 NOTE — Progress Notes (Signed)
Subjective:  Patient ID: Allen [name]  DOB: Apr 01, 1919 MRN: [id number]  Allen [name] is a 85 y.o. male with a PMH of dementia, 2nd degree AV block, GERD, hypothyroidism, BPH, HLD, here today for syncopal event.   HPI:  Syncope: -History is provided by the son who is primary caregiver given that patient does not speak [place] and is hard of hearing. States that yesterday patient was in the bathroom having a bowel movement when he slumped over and was nonresponsive.  Family states that they called EMS who came and performed lab work and patient slowly returned to normal.  States that he has otherwise been acting normally.  Reports that he has not had any further episodes of syncope since yesterday when he was straining for a BM. -Son states that he has dementia and is starting to have less of an appetite.  States that he is still able to drink 1-2 ensures in the morning as well as 1/2-1 Ensure at night.  They have been giving him any food that they are able to give him however they have noticed that he has stopped drinking as much. -Family states that he is never alone in the house and that they have prepared the house to decrease patient's risk of falling.  When they question patient he states that he did not have any chest pain or lightheadedness prior to the fall.  He denies any palpitations or shortness of breath.  ROS: All other systems otherwise negative, except as mentioned in HPI  Social hx: Lives with family who are very attentive Smoking status reviewed  Patient Active Problem List   Diagnosis Date Noted  . Insomnia 07/21/2018  . Hypothyroid 04/19/2018  . Near syncope 04/19/2018  . Visual impairment   . Urinary incontinence, nocturnal enuresis 05/08/2016  . Impaired functional mobility, balance, gait, and endurance 05/07/2016  . Syncope 03/11/2013  . DAT (dementia of Alzheimer type) without behavioral or psychological symptoms of dementia 12/23/2012   . TSH elevation 03/26/2011  . Second degree Mobitz II AV block 03/13/2010  . Hearing loss 11/09/2006  . BPH (benign prostatic hyperplasia) 11/09/2006  . ALLERGIC RHINITIS, SEASONAL 10/20/2006  . Hyperlipidemia 08/05/2006  . GASTROESOPHAGEAL REFLUX, NO ESOPHAGITIS 08/05/2006     Objective:  BP 102/60   Pulse 68   Temp 97.7 F (36.5 C) (Oral)   Wt 116 lb (52.6 kg)   SpO2 95%   BMI 21.22 kg/m    Orthostatics: Flat- 98/50 95% 62bpm Sitting- 110/60 94% 73bpm Standing- 100/54 95% 69bpm Vitals and nursing note reviewed  General: NAD, pleasant, elderly male Cardiac: RRR, normal heart sounds, no m/r/g Pulm: normal effort, CTAB Extremities: no edema or cyanosis. WWP. Skin: warm and dry, no rashes noted Neuro: no focal deficits  Assessment & Plan:   Syncope Likely vasovagal syncope given history.  Patient with soft BP here in office at 102/60 however orthostatics were negative.  Patient able to walk in the office with his walker using minimal assistance.  Family reassured and medications reviewed.  Only medication that could be exacerbating patient symptoms is silodosin however given history of BPH would not be beneficial for patient to also strain while urinating so we will continue to have patient on this medication.  Encouraged family to try to encourage patient to drink and also suggested that he may benefit from compression stockings.  If patient has continued syncopal episodes and hypotension may benefit from medication such as Midrin however will avoid at this time.  Family  reassured and will continue to monitor patient at home as they have been previously doing.  Patient would also benefit from further discussion about CODE STATUS in the future.   [place] Ceara Wrightson, DO Family Medicine Resident PGY-2

## 2018-09-23 NOTE — Telephone Encounter (Signed)
Patient's daughter called due to concern for syncope yesterday.  Patient was brushing his teeth in the bathroom when she heard him call out.  She went to check on him and he was sitting on either the floor or toilet (unclear which) and drooling with a vacant stare.  No shaking.  Did not fall to ground or hit head.  Able to converse with her after a few moments.  Called EMS, after placing O2 on patient he began to perk up.  Brought to ED.  Normal labs, normal vital signs.  Diagnosed with vasovagal syncope.  Per note, offered admission.  Pt's daughter states they were discouraged from admission due to concerns for coronavirus infection, which is reasonable.   Patient did well overnight and this AM.  However daughter is concerned and wants to make sure nothing else is going on.  Evidently has had a decline in food intake over past 2 weeks.    Plan will be to bring patient in, obtain vitals and possibly orthostatics, and make sure still doing well.  Ultimately will likely just need reassurance.  Front office will call daughter back to schedule appt for this PM before heading into the weekend.

## 2018-09-24 ENCOUNTER — Encounter: Payer: Self-pay | Admitting: Family Medicine

## 2018-09-24 NOTE — Assessment & Plan Note (Addendum)
Likely vasovagal syncope given history.  Patient with soft BP here in office at 102/60 however orthostatics were negative.  Patient able to walk in the office with his walker using minimal assistance.  Family reassured and medications reviewed.  Only medication that could be exacerbating patient symptoms is silodosin however given history of BPH would not be beneficial for patient to also strain while urinating so we will continue to have patient on this medication.  Encouraged family to try to encourage patient to drink and also suggested that he may benefit from compression stockings.  If patient has continued syncopal episodes and hypotension may benefit from medication such as Midrin however will avoid at this time.  Family reassured and will continue to monitor patient at home as they have been previously doing.  Patient would also benefit from further discussion about CODE STATUS in the future.

## 2018-09-28 ENCOUNTER — Other Ambulatory Visit: Payer: Self-pay

## 2018-09-28 ENCOUNTER — Telehealth (INDEPENDENT_AMBULATORY_CARE_PROVIDER_SITE_OTHER): Payer: Medicare Other | Admitting: Family Medicine

## 2018-09-28 DIAGNOSIS — R031 Nonspecific low blood-pressure reading: Secondary | ICD-10-CM

## 2018-09-28 DIAGNOSIS — F0390 Unspecified dementia without behavioral disturbance: Secondary | ICD-10-CM | POA: Diagnosis not present

## 2018-09-28 DIAGNOSIS — R630 Anorexia: Secondary | ICD-10-CM

## 2018-09-28 DIAGNOSIS — F03C0 Unspecified dementia, severe, without behavioral disturbance, psychotic disturbance, mood disturbance, and anxiety: Secondary | ICD-10-CM

## 2018-09-28 NOTE — Progress Notes (Signed)
[facility]  Patient consented to have virtual visit. Method of visit: Telephone The patient non-English speaking. An interpreter was used for the entire visit.   Encounter participants: Patient: Allen [name] - located at home Provider: Westley Chandlerarina M Dmani Mizer [facility]  Others (if applicable): Daughter in law and caregiver, Sports coachBeatrize   Chief Complaint: lower blood pressure   HPI: Allen [name] is a 85 year old with history of advanced dementia and sick sinus syndrome presenting with his daughter in law for evaluation of poor eating and low blood pressure.  An interpreter was used for the visit with the patient and the daughter-in-law.  His son Allen LemmingVictor speaks fluent [place].  The patient was seen last week for syncope. He was evaluated in the Emergency Room. Labs were unremarkable, it appears pacemaker transmission normal, no episodes since February 12th. Labs were unremarkable. Daughter in law has been monitoring blood pressure and heart rate-- last readings as follows:  4/20 119/60 HR 60  4/21  115/58 HR 63 4/22 112/53 HR 63 The caregiver and daughter-in-law is very worried as the patient typically drinks between 1-3 ensures per day.  He has not had any at all today.  He has had more labored breathing over the last few days as well.  This only occurs when he is sleeping.  She has also noticed some fluctuations in his mental status.  At baseline he has quite advanced dementia but is sleeping a lot more than usual recently.  He has had no cough no fevers no new rash when she wakes him up he is acting like his usual self but falls asleep more quickly.  He has not had any falls.  ROS: per HPI  Pertinent PMHx:  Advanced dementia Sick sinus syndrome  Exam:  Patient currently sleeping and did not assess respiratory status.  Per daughter in law he is breathing comfortably right now as he  sleeps  Assessment/Plan:  Advanced Dementia with Decreased Appetite, I discussed the options at length with both the daughter-in-law and the son-in-law.  The patient does not have decision-making capacity it appears.  The son reports that he does not want to bring his dad to the emergency department for further evaluation.  He specifically wants a medication to improve the blood pressure.  I spent 10 to 15 minutes discussing with the son that I believe his blood pressure values at this time are reasonable given that he is sleeping.  Additionally I suspect this is related to his poor oral intake and possible dehydration.  I reviewed this with the son.  My recommendation is that if the daughter-in-law and son want full scope care and a full evaluation they present to the emergency room.  I also discussed the alternative options including an evaluation and consultation with palliative care and hospice given their family members progression of his advanced dementia and food refusal.  There is also an option to bring their father to the ED for evaluation and transition to hospice if indicated.  This could simply be a progression of his Alzheimer's alternatively this could be due to an underlying cause such as infection.  The patient was evaluated for similar symptoms in the emergency room last week and found not to have a significant source.  Does not appear his pacemaker is the cause of his fatigue or change as his most recent remote transmission had no episodes since February.  His labs were relatively normal last week.  I did discuss that if at any point the family becomes more concerned or has a change in her plan further family member they can present to the emergency department we are happy to provide care.  The patient will continue to benefit from goals of care discussion.  At the end of the discussion with the daughter in law and the son they decided that they would like to have an evaluation with hospice  and palliative care.  I placed an urgent referral to hospice of the [place].  I spoke with the hospice of the [facility] nurse.  She will reach out to the family today.  I provided the direct line to the son Allen [name].  Discussed with Dr. Pollie Meyer and [facility] Nurse.   Time spent during visit coordinating care: 25 minutes.

## 2018-09-29 ENCOUNTER — Telehealth: Payer: Self-pay | Admitting: Family Medicine

## 2018-09-29 NOTE — Telephone Encounter (Signed)
Daughter is calling with some question about her dad. Hospice came out but there is some confusion on what they can and can not do. She would like to speak to Dr. Manson Passey to see if he needs to come to the office. jw

## 2018-09-29 NOTE — Telephone Encounter (Signed)
Called number listed below to reach daughter. Spoke with son. He reports they would like hospice services. They wanted to know if they should come to the office 'just to make sure there is no cause'. I discussed options again at length. The patient is eating a bit better today. Patient would like to monitor over weekend and see clinical course---will discuss with sister. They are aware of options including full assessment in ED. Patient has already been seen in clinic and had labs last week for same issue, which were unremarkable. All questions answered at this time--- family will likely have more questions over next few days, I am happy to answer to best of my ability.   Terisa Starr, MD  Family Medicine Teaching Service

## 2018-10-01 ENCOUNTER — Telehealth: Payer: Self-pay | Admitting: Family Medicine

## 2018-10-01 NOTE — Telephone Encounter (Signed)
Called son to check in on his father. Last night, slept better. He is drinking Ensure, juice. He is eating minimally. They report they are 'taking it day by day'. He reports palliative and [facility] assessed patient, felt patient was not appropriate for hospice at this time. All questions answered.  Terisa Starr, MD  Family Medicine Teaching Service

## 2018-10-13 ENCOUNTER — Other Ambulatory Visit: Payer: Self-pay | Admitting: Family Medicine

## 2018-11-02 ENCOUNTER — Other Ambulatory Visit: Payer: Self-pay | Admitting: Family Medicine

## 2018-11-03 NOTE — Telephone Encounter (Signed)
Red team, please clarify with patient's children whether he is actually taking omeprazole.  Thanks Latrelle Dodrill, MD

## 2018-11-03 NOTE — Telephone Encounter (Signed)
Pt son said he is and that hospice should be taking care of it. Ata Pecha Bruna Potter, CMA

## 2018-11-08 ENCOUNTER — Encounter: Payer: Medicare Other | Admitting: *Deleted

## 2018-11-09 ENCOUNTER — Telehealth: Payer: Self-pay

## 2018-11-09 NOTE — Telephone Encounter (Signed)
Left message for patient to remind of missed remote transmission.  

## 2018-11-16 ENCOUNTER — Encounter: Payer: Self-pay | Admitting: Cardiology

## 2018-11-28 ENCOUNTER — Ambulatory Visit (INDEPENDENT_AMBULATORY_CARE_PROVIDER_SITE_OTHER): Payer: Medicare Other | Admitting: *Deleted

## 2018-11-28 DIAGNOSIS — I441 Atrioventricular block, second degree: Secondary | ICD-10-CM

## 2018-11-28 DIAGNOSIS — R55 Syncope and collapse: Secondary | ICD-10-CM

## 2018-11-28 LAB — CUP PACEART REMOTE DEVICE CHECK
Battery Remaining Longevity: 122 mo
Battery Remaining Percentage: 95.5 %
Battery Voltage: 2.98 V
Brady Statistic AP VP Percent: 89 %
Brady Statistic AP VS Percent: 1 %
Brady Statistic AS VP Percent: 11 %
Brady Statistic AS VS Percent: 1 %
Brady Statistic RA Percent Paced: 89 %
Brady Statistic RV Percent Paced: 99 %
Date Time Interrogation Session: 20200621022140
Implantable Lead Implant Date: 20101018
Implantable Lead Implant Date: 20101018
Implantable Lead Location: 753859
Implantable Lead Location: 753860
Implantable Lead Model: 246367
Implantable Lead Model: 5076
Implantable Lead Serial Number: 28583747
Implantable Pulse Generator Implant Date: 20170328
Lead Channel Impedance Value: 460 Ohm
Lead Channel Impedance Value: 690 Ohm
Lead Channel Pacing Threshold Amplitude: 0.5 V
Lead Channel Pacing Threshold Amplitude: 1.5 V
Lead Channel Pacing Threshold Pulse Width: 0.4 ms
Lead Channel Pacing Threshold Pulse Width: 0.4 ms
Lead Channel Sensing Intrinsic Amplitude: 3.1 mV
Lead Channel Sensing Intrinsic Amplitude: 4.5 mV
Lead Channel Setting Pacing Amplitude: 0.75 V
Lead Channel Setting Pacing Amplitude: 2.5 V
Lead Channel Setting Pacing Pulse Width: 0.4 ms
Lead Channel Setting Sensing Sensitivity: 0.7 mV
Pulse Gen Model: 2240
Pulse Gen Serial Number: 7893011

## 2018-12-06 ENCOUNTER — Other Ambulatory Visit: Payer: Self-pay | Admitting: Family Medicine

## 2018-12-06 NOTE — Progress Notes (Signed)
Remote pacemaker transmission.   

## 2018-12-07 ENCOUNTER — Other Ambulatory Visit: Payer: Self-pay | Admitting: Family Medicine

## 2018-12-07 NOTE — Telephone Encounter (Signed)
Spoke with Christy Sartorius. He was concerned about "missed remote" and wanted to know the results of his most recent.  Let him know that missed remotes can happen if the monitor is disconnected or if the patient is not near by.   Recent transmission looks OK.   No further questions.    Legrand Como [address] [place], PA-C 12/07/2018 9:32 AM

## 2019-01-07 DEATH — deceased

## 2019-01-20 ENCOUNTER — Other Ambulatory Visit: Payer: Self-pay | Admitting: Family Medicine

## 2019-02-28 ENCOUNTER — Encounter: Payer: Medicare Other | Admitting: *Deleted

## 2019-03-12 ENCOUNTER — Other Ambulatory Visit: Payer: Self-pay | Admitting: Family Medicine

## 2019-03-13 ENCOUNTER — Other Ambulatory Visit: Payer: Self-pay | Admitting: Family Medicine
